# Patient Record
Sex: Female | Born: 1990 | Race: White | Hispanic: No | Smoking: Former smoker
Health system: Southern US, Community
[De-identification: ages and names within clinical notes are randomized; demographics above are authoritative.]

## PROBLEM LIST (undated history)

## (undated) ENCOUNTER — Inpatient Hospital Stay (HOSPITAL_COMMUNITY): Payer: Self-pay

## (undated) DIAGNOSIS — O26891 Other specified pregnancy related conditions, first trimester: Secondary | ICD-10-CM

## (undated) DIAGNOSIS — B379 Candidiasis, unspecified: Secondary | ICD-10-CM

## (undated) DIAGNOSIS — Z3490 Encounter for supervision of normal pregnancy, unspecified, unspecified trimester: Secondary | ICD-10-CM

## (undated) DIAGNOSIS — N898 Other specified noninflammatory disorders of vagina: Secondary | ICD-10-CM

## (undated) DIAGNOSIS — K589 Irritable bowel syndrome without diarrhea: Secondary | ICD-10-CM

## (undated) DIAGNOSIS — N949 Unspecified condition associated with female genital organs and menstrual cycle: Secondary | ICD-10-CM

## (undated) DIAGNOSIS — O98312 Other infections with a predominantly sexual mode of transmission complicating pregnancy, second trimester: Secondary | ICD-10-CM

## (undated) DIAGNOSIS — Z309 Encounter for contraceptive management, unspecified: Secondary | ICD-10-CM

## (undated) DIAGNOSIS — K219 Gastro-esophageal reflux disease without esophagitis: Secondary | ICD-10-CM

## (undated) DIAGNOSIS — Z789 Other specified health status: Secondary | ICD-10-CM

## (undated) DIAGNOSIS — A630 Anogenital (venereal) warts: Secondary | ICD-10-CM

## (undated) HISTORY — DX: Anogenital (venereal) warts: A63.0

## (undated) HISTORY — DX: Encounter for contraceptive management, unspecified: Z30.9

## (undated) HISTORY — DX: Encounter for supervision of normal pregnancy, unspecified, unspecified trimester: Z34.90

## (undated) HISTORY — DX: Other specified pregnancy related conditions, first trimester: O26.891

## (undated) HISTORY — DX: Candidiasis, unspecified: B37.9

## (undated) HISTORY — DX: Other specified noninflammatory disorders of vagina: N89.8

## (undated) HISTORY — DX: Unspecified condition associated with female genital organs and menstrual cycle: N94.9

## (undated) HISTORY — DX: Anogenital (venereal) warts: O98.312

## (undated) HISTORY — DX: Other specified health status: Z78.9

---
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
## 2012-05-05 ENCOUNTER — Other Ambulatory Visit (HOSPITAL_COMMUNITY)
Admission: RE | Admit: 2012-05-05 | Discharge: 2012-05-05 | Disposition: A | Discharge status: Home / Self Care | Payer: Medicaid Other | Source: Ambulatory Visit | Attending: Obstetrics and Gynecology | Admitting: Obstetrics and Gynecology

## 2012-05-05 ENCOUNTER — Other Ambulatory Visit: Payer: Self-pay | Admitting: Obstetrics and Gynecology

## 2012-05-05 DIAGNOSIS — Z01419 Encounter for gynecological examination (general) (routine) without abnormal findings: Secondary | ICD-10-CM | POA: Insufficient documentation

## 2012-05-05 DIAGNOSIS — Z113 Encounter for screening for infections with a predominantly sexual mode of transmission: Secondary | ICD-10-CM | POA: Insufficient documentation

## 2012-07-29 ENCOUNTER — Telehealth: Payer: Self-pay | Admitting: Adult Health

## 2012-07-29 NOTE — Telephone Encounter (Signed)
Taking BC pills as prescribed, has not had a period, is sexually active. Period usually starts on the  5th of each month but has been irregular. Pt informed to continue [facility] and can wait a few more days to see if period starts if not can take home pregnancy test or make appt for pregnancy test. Pt verbalized understanding.

## 2012-08-23 ENCOUNTER — Telehealth: Payer: Self-pay | Admitting: Adult Health

## 2012-08-23 NOTE — Telephone Encounter (Signed)
Last period July 07, 2012, on 3rd pack of new bcp, took two pregnancy test both were neg.  pt states did miss pills, but would take it the next day. Appt made for a pregnancy test.

## 2012-08-26 ENCOUNTER — Encounter: Payer: Self-pay | Admitting: *Deleted

## 2012-08-27 ENCOUNTER — Ambulatory Visit (INDEPENDENT_AMBULATORY_CARE_PROVIDER_SITE_OTHER): Payer: BC Managed Care – PPO | Admitting: Obstetrics & Gynecology

## 2012-08-27 VITALS — BP 98/60 | Ht 65.0 in | Wt 117.2 lb

## 2012-08-27 DIAGNOSIS — Z3202 Encounter for pregnancy test, result negative: Secondary | ICD-10-CM

## 2012-08-27 DIAGNOSIS — Z3200 Encounter for pregnancy test, result unknown: Secondary | ICD-10-CM

## 2012-08-27 LAB — POCT URINE PREGNANCY: Preg Test, Ur: NEGATIVE

## 2012-08-28 LAB — HCG, QUANTITATIVE, PREGNANCY: hCG, Beta Chain, Quant, S: <2 m[IU]/mL

## 2012-08-30 ENCOUNTER — Telehealth: Payer: Self-pay | Admitting: *Deleted

## 2012-08-31 ENCOUNTER — Telehealth: Payer: Self-pay | Admitting: Obstetrics and Gynecology

## 2012-08-31 NOTE — Telephone Encounter (Signed)
Pt aware of QHCG results.

## 2012-09-01 NOTE — Telephone Encounter (Signed)
Pt aware of negative QHCG, transferred call to front staff to make appt with Cyril Mourning, NP

## 2012-09-08 ENCOUNTER — Ambulatory Visit: Payer: BC Managed Care – PPO | Admitting: Adult Health

## 2012-09-16 NOTE — Progress Notes (Signed)
Patient ID: Megan Tran, female   DOB: Jul 21, 1990, 22 y.o.   MRN: [id number] Patient in for evaluation of a possible pregnancy.  UPT POC negative Quantitative is requested

## 2012-10-14 ENCOUNTER — Telehealth: Payer: Self-pay | Admitting: Adult Health

## 2012-10-14 NOTE — Telephone Encounter (Signed)
She missed some pills and is bleeding reassured, keep taking pill if still bleeding next week make appt.

## 2012-11-02 ENCOUNTER — Ambulatory Visit (INDEPENDENT_AMBULATORY_CARE_PROVIDER_SITE_OTHER): Payer: BC Managed Care – PPO | Admitting: Adult Health

## 2012-11-02 ENCOUNTER — Encounter: Payer: Self-pay | Admitting: Adult Health

## 2012-11-02 VITALS — BP 110/66 | Ht 64.0 in | Wt 120.0 lb

## 2012-11-02 DIAGNOSIS — N898 Other specified noninflammatory disorders of vagina: Secondary | ICD-10-CM

## 2012-11-02 DIAGNOSIS — B379 Candidiasis, unspecified: Secondary | ICD-10-CM

## 2012-11-02 HISTORY — DX: Candidiasis, unspecified: B37.9

## 2012-11-02 LAB — POCT URINALYSIS DIPSTICK
Blood, UA: NEGATIVE
Glucose, UA: NEGATIVE
Ketones, UA: NEGATIVE
Leukocytes, UA: NEGATIVE
Nitrite, UA: NEGATIVE

## 2012-11-02 LAB — POCT WET PREP (WET MOUNT): WBC, Wet Prep HPF POC: NEGATIVE

## 2012-11-02 MED ORDER — FLUCONAZOLE 150 MG PO TABS: ORAL_TABLET | ORAL | Status: DC

## 2012-11-02 NOTE — Progress Notes (Signed)
Subjective:     Patient ID: Megan Tran, female   DOB: 02-Sep-1990, 22 y.o.   MRN: [id number]  HPI Megan Tran is a 22 year old in today complaining of discharge and itching and irritation x 1 week,she has had discomfort with sex and said astro glide burned.She had some burning with peeing too. No new sex partners.  Review of Systems Positives in HPI   Reviewed past medical,surgical, social and family history. Reviewed medications and allergies.  Objective:   Physical Exam BP 110/66  Ht 5\' 4"  (1.626 m)  Wt 120 lb (54.432 kg)  BMI 20.59 kg/m2  LMP 10/03/2012   Urine dipstick trace protein, Skin warm and dry.Pelvic: external genitalia is normal in appearance, vagina: white discharge without odor, cervix:smooth, uterus: normal size, shape and contour, non tender, no masses felt, adnexa: no masses or tenderness noted. Wet prep: + for yeast.  Assessment:     Vaginal discharge  Yeast    Plan:     Rx diflucan 150 mg #2 1 now and 1 in 2 days if needed with 1 refill Review handout on yeast, don't sleep in panties Try olive oil with sex Follow up prn

## 2012-11-02 NOTE — Patient Instructions (Addendum)
Monilial Vaginitis Vaginitis in a soreness, swelling and redness (inflammation) of the vagina and vulva. Monilial vaginitis is not a sexually transmitted infection. CAUSES  Yeast vaginitis is caused by yeast (candida) that is normally found in your vagina. With a yeast infection, the candida has overgrown in number to a point that upsets the chemical balance. SYMPTOMS   White, thick vaginal discharge.  Swelling, itching, redness and irritation of the vagina and possibly the lips of the vagina (vulva).  Burning or painful urination.  Painful intercourse. DIAGNOSIS  Things that may contribute to monilial vaginitis are:  Postmenopausal and virginal states.  Pregnancy.  Infections.  Being tired, sick or stressed, especially if you had monilial vaginitis in the past.  Diabetes. Good control will help lower the chance.  Birth control pills.  Tight fitting garments.  Using bubble bath, feminine sprays, douches or deodorant tampons.  Taking certain medications that kill germs (antibiotics).  Sporadic recurrence can occur if you become ill. TREATMENT  Your caregiver will give you medication.  There are several kinds of anti monilial vaginal creams and suppositories specific for monilial vaginitis. For recurrent yeast infections, use a suppository or cream in the vagina 2 times a week, or as directed.  Anti-monilial or steroid cream for the itching or irritation of the vulva may also be used. Get your caregiver's permission.  Painting the vagina with methylene blue solution may help if the monilial cream does not work.  Eating yogurt may help prevent monilial vaginitis. HOME CARE INSTRUCTIONS   Finish all medication as prescribed.  Do not have sex until treatment is completed or after your caregiver tells you it is okay.  Take warm sitz baths.  Do not douche.  Do not use tampons, especially scented ones.  Wear cotton underwear.  Avoid tight pants and panty  hose.  Tell your sexual partner that you have a yeast infection. They should go to their caregiver if they have symptoms such as mild rash or itching.  Your sexual partner should be treated as well if your infection is difficult to eliminate.  Practice safer sex. Use condoms.  Some vaginal medications cause latex condoms to fail. Vaginal medications that harm condoms are:  Cleocin cream.  Butoconazole (Femstat).  Terconazole (Terazol) vaginal suppository.  Miconazole (Monistat) (may be purchased over the counter). SEEK MEDICAL CARE IF:   You have a temperature by mouth above 102 F (38.9 C).  The infection is getting worse after 2 days of treatment.  The infection is not getting better after 3 days of treatment.  You develop blisters in or around your vagina.  You develop vaginal bleeding, and it is not your menstrual period.  You have pain when you urinate.  You develop intestinal problems.  You have pain with sexual intercourse. Document Released: 01/15/2005 Document Revised: 06/30/2011 Document Reviewed: 09/29/2008 ExitCare Patient Information 2014 [employer]. Take diflucan  Follow up prn 

## 2013-02-03 ENCOUNTER — Telehealth: Payer: Self-pay | Admitting: Obstetrics and Gynecology

## 2013-02-03 NOTE — Telephone Encounter (Signed)
Spoke with pt. Started birth control in March. Has had a headache everyday x 1 month. Is on Loestrin. Tylenol and Ibuprofen helps headache. Advised birth control can cause headaches. Advised if she can't tolerate the headaches, call us back and we can get her in to have birth control switched. Pt voiced understanding. JSY

## 2013-07-23 ENCOUNTER — Other Ambulatory Visit: Payer: Self-pay | Admitting: Adult Health

## 2013-08-17 ENCOUNTER — Other Ambulatory Visit: Payer: Self-pay | Admitting: *Deleted

## 2013-09-30 ENCOUNTER — Other Ambulatory Visit: Payer: Self-pay | Admitting: Adult Health

## 2013-11-08 ENCOUNTER — Other Ambulatory Visit: Payer: Self-pay | Admitting: Adult Health

## 2013-11-18 ENCOUNTER — Ambulatory Visit: Payer: BC Managed Care – PPO | Admitting: Obstetrics & Gynecology

## 2013-11-22 ENCOUNTER — Ambulatory Visit (INDEPENDENT_AMBULATORY_CARE_PROVIDER_SITE_OTHER): Payer: BC Managed Care – PPO | Admitting: Obstetrics & Gynecology

## 2013-11-22 ENCOUNTER — Encounter: Payer: Self-pay | Admitting: Obstetrics & Gynecology

## 2013-11-22 VITALS — BP 106/76 | Ht 64.0 in | Wt 128.0 lb

## 2013-11-22 DIAGNOSIS — Z3009 Encounter for other general counseling and advice on contraception: Secondary | ICD-10-CM

## 2013-11-22 DIAGNOSIS — Z30011 Encounter for initial prescription of contraceptive pills: Secondary | ICD-10-CM

## 2013-11-22 MED ORDER — NORETHINDRONE ACET-ETHINYL EST 1-20 MG-MCG PO TABS: ORAL_TABLET | ORAL | Status: DC

## 2013-11-22 NOTE — Progress Notes (Signed)
Patient ID: Megan Tran, female   DOB: 02/05/1991, 23 y.o.   MRN: [id number] On continuous microgestin wants to continue No periods No problems  Blood pressure 106/76, height 5\' 4"  (1.626 m), weight 128 lb (58.06 kg).  No exam  follo wup 1 year

## 2014-03-03 ENCOUNTER — Other Ambulatory Visit (HOSPITAL_COMMUNITY)
Admission: RE | Admit: 2014-03-03 | Discharge: 2014-03-03 | Disposition: A | Discharge status: Home / Self Care | Payer: BC Managed Care – PPO | Source: Ambulatory Visit | Attending: Adult Health | Admitting: Adult Health

## 2014-03-03 ENCOUNTER — Encounter: Payer: Self-pay | Admitting: Adult Health

## 2014-03-03 ENCOUNTER — Ambulatory Visit (INDEPENDENT_AMBULATORY_CARE_PROVIDER_SITE_OTHER): Payer: BC Managed Care – PPO | Admitting: Adult Health

## 2014-03-03 VITALS — BP 108/60 | HR 76 | Ht 63.25 in | Wt 131.0 lb

## 2014-03-03 DIAGNOSIS — Z309 Encounter for contraceptive management, unspecified: Secondary | ICD-10-CM

## 2014-03-03 DIAGNOSIS — Z113 Encounter for screening for infections with a predominantly sexual mode of transmission: Secondary | ICD-10-CM | POA: Insufficient documentation

## 2014-03-03 DIAGNOSIS — Z01419 Encounter for gynecological examination (general) (routine) without abnormal findings: Secondary | ICD-10-CM | POA: Insufficient documentation

## 2014-03-03 DIAGNOSIS — Z308 Encounter for other contraceptive management: Secondary | ICD-10-CM | POA: Insufficient documentation

## 2014-03-03 DIAGNOSIS — Z3041 Encounter for surveillance of contraceptive pills: Secondary | ICD-10-CM

## 2014-03-03 HISTORY — DX: Encounter for contraceptive management, unspecified: Z30.9

## 2014-03-03 NOTE — Progress Notes (Signed)
Patient ID: Megan Tran, female   DOB: 01/18/1991, 23 y.o.   MRN: [id number] History of Present Illness: Megan Tran is a 23 year old white female in for family planning pap and physical.She is on the pill and thinks she may want Skyla IUD.No periods with OCs. She got flu shot.  Current Medications, Allergies, Past Medical History, Past Surgical History, Family History and Social History were reviewed in [facility] electronic medical record.     Review of Systems: Patient denies any headaches, blurred vision, shortness of breath, chest pain, abdominal pain, problems with bowel movements, urination, or intercourse. No joint pain or mood swings, but has some vaginal dryness and decreased libido.   Physical Exam:BP 108/60 mmHg  Pulse 76  Ht 5' 3.25" (1.607 m)  Wt 131 lb (59.421 kg)  BMI 23.01 kg/m2 General:  Well developed, well nourished, no acute distress Skin:  Warm and dry Neck:  Midline trachea, normal thyroid Lungs; Clear to auscultation bilaterally Breast:  No dominant palpable mass, retraction, or nipple discharge Cardiovascular: Regular rate and rhythm Abdomen:  Soft, non tender, no hepatosplenomegaly Pelvic:  External genitalia is normal in appearance.  The vagina is normal in appearance.  The cervix is slightly everted at OS, pap with GC/CHL performed.  Uterus is felt to be normal size, shape, and contour.  No adnexal masses or tenderness noted. Extremities:  No swelling or varicosities noted Psych:  No mood changes,alet and cooperative,seems happy Discussed Skyla and gave handout to review, if likes it call and I will set up appt.Will give cytotec the am of.   Impression: Well woman gyn exam with pap  Contraceptive management    Plan: Physical in 1 year Continue OCs Review handout on Skyla Check HIV,RPR and HSV2

## 2014-03-03 NOTE — Patient Instructions (Signed)
Levonorgestrel intrauterine device (IUD) What is this medicine? LEVONORGESTREL IUD (LEE voe nor jes trel) is a contraceptive (birth control) device. The device is placed inside the uterus by a healthcare professional. It is used to prevent pregnancy and can also be used to treat heavy bleeding that occurs during your period. Depending on the device, it can be used for 3 to 5 years. This medicine may be used for other purposes; ask your health care provider or pharmacist if you have questions. COMMON BRAND NAME(S): LILETTA, Mirena, Skyla What should I tell my health care provider before I take this medicine? They need to know if you have any of these conditions: -abnormal Pap smear -cancer of the breast, uterus, or cervix -diabetes -endometritis -genital or pelvic infection now or in the past -have more than one sexual partner or your partner has more than one partner -heart disease -history of an ectopic or tubal pregnancy -immune system problems -IUD in place -liver disease or tumor -problems with blood clots or take blood-thinners -use intravenous drugs -uterus of unusual shape -vaginal bleeding that has not been explained -an unusual or allergic reaction to levonorgestrel, other hormones, silicone, or polyethylene, medicines, foods, dyes, or preservatives -pregnant or trying to get pregnant -breast-feeding How should I use this medicine? This device is placed inside the uterus by a health care professional. Talk to your pediatrician regarding the use of this medicine in children. Special care may be needed. Overdosage: If you think you have taken too much of this medicine contact a poison control center or emergency room at once. NOTE: This medicine is only for you. Do not share this medicine with others. What if I miss a dose? This does not apply. What may interact with this medicine? Do not take this medicine with any of the following  medications: -amprenavir -bosentan -fosamprenavir This medicine may also interact with the following medications: -aprepitant -barbiturate medicines for inducing sleep or treating seizures -bexarotene -griseofulvin -medicines to treat seizures like carbamazepine, ethotoin, felbamate, oxcarbazepine, phenytoin, topiramate -modafinil -pioglitazone -rifabutin -rifampin -rifapentine -some medicines to treat HIV infection like atazanavir, indinavir, lopinavir, nelfinavir, tipranavir, ritonavir -St. John's wort -warfarin This list may not describe all possible interactions. Give your health care provider a list of all the medicines, herbs, non-prescription drugs, or dietary supplements you use. Also tell them if you smoke, drink alcohol, or use illegal drugs. Some items may interact with your medicine. What should I watch for while using this medicine? Visit your doctor or health care professional for regular check ups. See your doctor if you or your partner has sexual contact with others, becomes HIV positive, or gets a sexual transmitted disease. This product does not protect you against HIV infection (AIDS) or other sexually transmitted diseases. You can check the placement of the IUD yourself by reaching up to the top of your vagina with clean fingers to feel the threads. Do not pull on the threads. It is a good habit to check placement after each menstrual period. Call your doctor right away if you feel more of the IUD than just the threads or if you cannot feel the threads at all. The IUD may come out by itself. You may become pregnant if the device comes out. If you notice that the IUD has come out use a backup birth control method like condoms and call your health care provider. Using tampons will not change the position of the IUD and are okay to use during your period. What side effects may   I notice from receiving this medicine? Side effects that you should report to your doctor or  health care professional as soon as possible: -allergic reactions like skin rash, itching or hives, swelling of the face, lips, or tongue -fever, flu-like symptoms -genital sores -high blood pressure -no menstrual period for 6 weeks during use -pain, swelling, warmth in the leg -pelvic pain or tenderness -severe or sudden headache -signs of pregnancy -stomach cramping -sudden shortness of breath -trouble with balance, talking, or walking -unusual vaginal bleeding, discharge -yellowing of the eyes or skin Side effects that usually do not require medical attention (report to your doctor or health care professional if they continue or are bothersome): -acne -breast pain -change in sex drive or performance -changes in weight -cramping, dizziness, or faintness while the device is being inserted -headache -irregular menstrual bleeding within first 3 to 6 months of use -nausea This list may not describe all possible side effects. Call your doctor for medical advice about side effects. You may report side effects to FDA at 1-800-FDA-1088. Where should I keep my medicine? This does not apply. NOTE: This sheet is a summary. It may not cover all possible information. If you have questions about this medicine, talk to your doctor, pharmacist, or health care provider.  2015, Elsevier/Gold Standard. (2011-05-08 13:54:04) Physical in 1 year

## 2014-03-04 LAB — RPR

## 2014-03-04 LAB — HIV ANTIBODY (ROUTINE TESTING W REFLEX): HIV 1&2 Ab, 4th Generation: NONREACTIVE

## 2014-03-07 LAB — CYTOLOGY - PAP

## 2014-03-08 LAB — HSV 2 ANTIBODY, IGG: HSV 2 Glycoprotein G Ab, IgG: <0.1 IV

## 2014-03-22 ENCOUNTER — Ambulatory Visit: Payer: BC Managed Care – PPO | Admitting: Advanced Practice Midwife

## 2014-03-22 ENCOUNTER — Other Ambulatory Visit: Payer: BC Managed Care – PPO

## 2014-03-23 ENCOUNTER — Other Ambulatory Visit: Payer: BC Managed Care – PPO

## 2014-03-27 ENCOUNTER — Ambulatory Visit: Payer: BC Managed Care – PPO | Admitting: Women's Health

## 2014-03-27 ENCOUNTER — Other Ambulatory Visit: Payer: BC Managed Care – PPO

## 2014-04-03 ENCOUNTER — Other Ambulatory Visit: Payer: BC Managed Care – PPO

## 2014-04-03 ENCOUNTER — Ambulatory Visit: Payer: BC Managed Care – PPO | Admitting: Women's Health

## 2014-09-27 ENCOUNTER — Other Ambulatory Visit: Payer: Self-pay | Admitting: Obstetrics & Gynecology

## 2015-01-30 ENCOUNTER — Ambulatory Visit (INDEPENDENT_AMBULATORY_CARE_PROVIDER_SITE_OTHER): Payer: BLUE CROSS/BLUE SHIELD | Admitting: Obstetrics and Gynecology

## 2015-01-30 ENCOUNTER — Encounter: Payer: Self-pay | Admitting: Obstetrics and Gynecology

## 2015-01-30 VITALS — BP 118/70 | Ht 64.0 in | Wt 131.0 lb

## 2015-01-30 DIAGNOSIS — N9089 Other specified noninflammatory disorders of vulva and perineum: Secondary | ICD-10-CM

## 2015-01-30 MED ORDER — NYSTATIN-TRIAMCINOLONE 100000-0.1 UNIT/GM-% EX OINT: 1.0000 "application " | TOPICAL_OINTMENT | Freq: Two times a day (BID) | CUTANEOUS | Status: DC

## 2015-01-30 NOTE — Progress Notes (Signed)
Patient ID: Megan Tran, female   DOB: [birth year], 24 y.o.   MRN: [id number]   [facility] Visit  Patient name: Megan Tran MRN [id number]  Date of birth: December 01, 1990  CC & HPI:  Megan Tran is a 24 y.o. female presenting today for complaints of irritation 5 days. She sexually active with no suspected risk of STI's. Used his infection is suspected  ROS:  No history of diabetes, any new partners partners etc.  Pertinent History Reviewed:   Reviewed: Significant for normal menses regular. One yeast infection 2014 Medical         Past Medical History  Diagnosis Date  . Medical history non-contributory   . Yeast infection 11/02/2012  . Contraceptive management 03/03/2014                              Surgical Hx:    Past Surgical History  Procedure Laterality Date  . Mouth surgery  Oct. 2012   Medications: Reviewed & Updated - see associated section                      No current outpatient prescriptions on file.   Social History: Reviewed -  reports that she has never smoked. She has never used smokeless tobacco.  Objective Findings:  Vitals: Blood pressure 118/70, height  (1.626 m), weight 131 lb (59.421 kg).  Physical Examination: General appearance - alert, well appearing, and in no distress, oriented to person, place, and time and normal appearing weight Mental status - alert, oriented to person, place, and time, normal mood, behavior, speech, dress, motor activity, and thought processes Abdomen - soft, nontender, nondistended, no masses or organomegaly Pelvic - normal external genitalia, vulva, vagina, cervix, uterus and adnexa, VULVA: normal appearing vulva with no masses, tenderness or lesions, VAGINA: normal appearing vagina with normal color and discharge, no lesions, CERVIX: normal appearing cervix without discharge or lesions, UTERUS: uterus is normal size, shape, consistency and nontender   Assessment & Plan:   A:  1. Normal exam 2.  Symptomatic irritation vulva  P:  1. Reassured  2. Symptomatic tx with Mytrex to vulva hs x 5d.

## 2015-04-30 ENCOUNTER — Ambulatory Visit (INDEPENDENT_AMBULATORY_CARE_PROVIDER_SITE_OTHER): Payer: BLUE CROSS/BLUE SHIELD | Admitting: Obstetrics and Gynecology

## 2015-04-30 ENCOUNTER — Encounter: Payer: Self-pay | Admitting: Obstetrics and Gynecology

## 2015-04-30 VITALS — BP 120/80 | Ht 65.0 in | Wt 132.0 lb

## 2015-04-30 DIAGNOSIS — Z3009 Encounter for other general counseling and advice on contraception: Secondary | ICD-10-CM

## 2015-04-30 DIAGNOSIS — Z3200 Encounter for pregnancy test, result unknown: Secondary | ICD-10-CM

## 2015-04-30 MED ORDER — NORETHINDRONE ACET-ETHINYL EST 1-20 MG-MCG PO TABS: 1.0000 | ORAL_TABLET | Freq: Every day | ORAL | Status: DC

## 2015-04-30 NOTE — Progress Notes (Signed)
Patient ID: Megan Tran [place], female   DOB: 01/26/1991, 25 y.o.   MRN: [id number]   [facility] Visit  Patient name: Megan Tran [place]all MRN [id number]  Date of birth: 01/07/1991  CC & HPI:  Megan Tran Storer is a 25 y.o. female presenting today for restart of ocp's.   ROS:  Pt previously on microgestin 1/20 with good results. Pt desires the same pill.   Pertinent History Reviewed:   Reviewed: Significant for no dvt , no bleeding probs Medical         Past Medical History  Diagnosis Date  . Medical history non-contributory   . Yeast infection 11/02/2012  . Contraceptive management 03/03/2014                              Surgical Hx:    Past Surgical History  Procedure Laterality Date  . Mouth surgery  Oct. 2012   Medications: Reviewed & Updated - see associated section                       Current outpatient prescriptions:  .  norethindrone-ethinyl estradiol (MICROGESTIN) 1-20 MG-MCG tablet, Take 1 tablet by mouth daily., Disp: 1 Package, Rfl: 11 .  nystatin-triamcinolone ointment (MYCOLOG), Apply 1 application topically 2 (two) times daily. To affected area. (Patient not taking: Reported on 04/30/2015), Disp: 30 g, Rfl: prn   Social History: Reviewed -  reports that she has never smoked. She has never used smokeless tobacco.  Objective Findings:  Vitals: Blood pressure 120/80, height 5\' 5"  (1.651 m), weight 132 lb (59.875 kg), last menstrual period 04/28/2015.  Physical Examination: General appearance - alert, well appearing, and in no distress, oriented to person, place, and time and normal appearing weight Mental status - alert, oriented to person, place, and time, normal mood, behavior, speech, dress, motor activity, and thought processes Eyes - pupils equal and reactive, extraocular eye movements intact  I spent 15 minutes with the visit with >than 50% spent in counseling and coordination of care.  Assessment & Plan:   A:  1. contr management  Tran:  1. Restart  microgestin 1/20

## 2015-04-30 NOTE — Progress Notes (Signed)
Patient ID: Megan Tran, female   DOB: 01/22/1991, 25 y.o.   MRN: [id number] Pt here today to discuss getting back on the BCP. UPT today is negative.

## 2015-05-14 ENCOUNTER — Telehealth: Payer: Self-pay | Admitting: *Deleted

## 2015-05-14 NOTE — Telephone Encounter (Signed)
Pt called and stated that she had a discharge and and some irritation and a rash. Pt states that she used monistat and it burned really bad for about 30 minutes but then the irritation and discharge kind of went away. Pt states that she still has a rash. I advised the pt that she would need to keep the appointment that she was given so that the rash can be evaluated. Pt verbalized understanding.

## 2015-05-15 ENCOUNTER — Ambulatory Visit: Payer: BLUE CROSS/BLUE SHIELD | Admitting: Obstetrics & Gynecology

## 2015-05-21 ENCOUNTER — Encounter: Payer: Self-pay | Admitting: Obstetrics and Gynecology

## 2015-05-21 ENCOUNTER — Ambulatory Visit (INDEPENDENT_AMBULATORY_CARE_PROVIDER_SITE_OTHER): Payer: BLUE CROSS/BLUE SHIELD | Admitting: Obstetrics and Gynecology

## 2015-05-21 ENCOUNTER — Ambulatory Visit: Payer: BLUE CROSS/BLUE SHIELD | Admitting: Obstetrics and Gynecology

## 2015-05-21 VITALS — BP 120/72 | Ht 64.0 in | Wt 132.0 lb

## 2015-05-21 DIAGNOSIS — Z01419 Encounter for gynecological examination (general) (routine) without abnormal findings: Secondary | ICD-10-CM

## 2015-05-21 DIAGNOSIS — Z3040 Encounter for surveillance of contraceptives, unspecified: Secondary | ICD-10-CM

## 2015-05-21 MED ORDER — NORETHINDRONE ACET-ETHINYL EST 1-20 MG-MCG PO TABS: 1.0000 | ORAL_TABLET | Freq: Every day | ORAL | Status: DC

## 2015-05-21 NOTE — Progress Notes (Signed)
Patient ID: Megan Tran, female   DOB: 1990-08-12, 25 y.o.   MRN: [id number]  Assessment:  Annual Gyn Exam  STI SCREEN hiv rpr gc chl Plan:  1. pap smear NOTdone, next pap due 02/2017 2. return annually or prn 3    Annual mammogram advised Subjective:  Megan Tran is a 25 y.o. female G0P0000 who presents for annual exam. Patient's last menstrual period was 04/28/2015. The patient has complaints today of new partner , wants sti screen, refil ocp's Recent vulvar irritation responds to otc monistat The following portions of the patient's history were reviewed and updated as appropriate: allergies, current medications, past family history, past medical history, past social history, past surgical history and problem list. Past Medical History  Diagnosis Date  . Medical history non-contributory   . Yeast infection 11/02/2012  . Contraceptive management 03/03/2014    Past Surgical History  Procedure Laterality Date  . Mouth surgery  Oct. 2012     Current outpatient prescriptions:  .  norethindrone-ethinyl estradiol (MICROGESTIN) 1-20 MG-MCG tablet, Take 1 tablet by mouth daily., Disp: 1 Package, Rfl: 11 .  nystatin-triamcinolone ointment (MYCOLOG), Apply 1 application topically 2 (two) times daily. To affected area., Disp: 30 g, Rfl: prn  Review of Systems Constitutional: negative Gastrointestinal: negative Genitourinary: recent pruritis  Objective:  BP 120/72 mmHg  Ht  (1.626 m)  Wt 132 lb (59.875 kg)  BMI 22.65 kg/m2  LMP 04/28/2015   BMI: Body mass index is 22.65 kg/(m^2).  General Appearance: Alert, appropriate appearance for age. No acute distress HEENT: Grossly normal Neck / Thyroid:  Cardiovascular: RRR; normal S1, S2, no murmur Lungs: CTA bilaterally Back: No CVAT Breast Exam: No dimpling, nipple retraction or discharge. No masses or nodes. and No masses or nodes.No dimpling, nipple retraction or discharge. Gastrointestinal: Soft, non-tender, no masses  or organomegaly Pelvic Exam: Vulva and vagina appear normal. Bimanual exam reveals normal uterus and adnexa. gc chl collected Rectovaginal: not indicated and normal rectal, no masses Lymphatic Exam: Non-palpable nodes in neck, clavicular, axillary, or inguinal regions Skin: no rash or abnormalities Neurologic: Normal gait and speech, no tremor  Psychiatric: Alert and oriented, appropriate affect.  Urinalysis:Not done  Christin Bach. MD Pgr [phone] 11:15 AM

## 2015-05-21 NOTE — Progress Notes (Signed)
Patient ID: Megan Tran, female   DOB: [birth year], 25 y.o.   MRN: [id number] Pt here today for annual exam. Pt had normal pap on 03/03/14.

## 2015-05-22 LAB — RPR: RPR Ser Ql: NONREACTIVE

## 2015-05-22 LAB — HIV ANTIBODY (ROUTINE TESTING W REFLEX): HIV SCREEN 4TH GENERATION: NONREACTIVE

## 2015-05-23 LAB — GC/CHLAMYDIA PROBE AMP
Chlamydia trachomatis, NAA: NEGATIVE
NEISSERIA GONORRHOEAE BY PCR: NEGATIVE

## 2015-08-07 ENCOUNTER — Telehealth: Payer: Self-pay | Admitting: Adult Health

## 2015-08-07 NOTE — Telephone Encounter (Signed)
Left message x 1. JSY 

## 2015-08-08 NOTE — Telephone Encounter (Signed)
Left message x 2. JSY 

## 2015-08-09 NOTE — Telephone Encounter (Signed)
Spoke with pt. Pt stopped birth control pills 1 month ago due to it was breaking her face out. She hasn't started her period.  Pt states her nipples are sore. Pregnancy tests have been negative. Pt last had sex Sunday. I advised to do another pregnancy test in 2 weeks if she hasn't started period. Pt may need to be seen if period don't start. Pt voiced understanding. JSY

## 2015-10-19 ENCOUNTER — Ambulatory Visit (INDEPENDENT_AMBULATORY_CARE_PROVIDER_SITE_OTHER): Payer: BLUE CROSS/BLUE SHIELD | Admitting: Adult Health

## 2015-10-19 ENCOUNTER — Encounter: Payer: Self-pay | Admitting: Adult Health

## 2015-10-19 VITALS — BP 100/64 | HR 70 | Ht 64.0 in | Wt 121.0 lb

## 2015-10-19 DIAGNOSIS — Z3490 Encounter for supervision of normal pregnancy, unspecified, unspecified trimester: Secondary | ICD-10-CM

## 2015-10-19 DIAGNOSIS — O3680X0 Pregnancy with inconclusive fetal viability, not applicable or unspecified: Secondary | ICD-10-CM

## 2015-10-19 DIAGNOSIS — Z3201 Encounter for pregnancy test, result positive: Secondary | ICD-10-CM | POA: Diagnosis not present

## 2015-10-19 HISTORY — DX: Encounter for supervision of normal pregnancy, unspecified, unspecified trimester: Z34.90

## 2015-10-19 LAB — POCT URINE PREGNANCY: Preg Test, Ur: POSITIVE — AB

## 2015-10-19 MED ORDER — PRENATAL PLUS 27-1 MG PO TABS: 1.0000 | ORAL_TABLET | Freq: Every day | ORAL | Status: DC

## 2015-10-19 NOTE — Patient Instructions (Signed)
First Trimester of Pregnancy The first trimester of pregnancy is from week 1 until the end of week 12 (months 1 through 3). A week after a sperm fertilizes an egg, the egg will implant on the wall of the uterus. This embryo will begin to develop into a baby. Genes from you and your partner are forming the baby. The female genes determine whether the baby is a boy or a girl. At 6-8 weeks, the eyes and face are formed, and the heartbeat can be seen on ultrasound. At the end of 12 weeks, all the baby's organs are formed.  Now that you are pregnant, you will want to do everything you can to have a healthy baby. Two of the most important things are to get good prenatal care and to follow your health care provider's instructions. Prenatal care is all the medical care you receive before the baby's birth. This care will help prevent, find, and treat any problems during the pregnancy and childbirth. BODY CHANGES Your body goes through many changes during pregnancy. The changes vary from woman to woman.   You may gain or lose a couple of pounds at first.  You may feel sick to your stomach (nauseous) and throw up (vomit). If the vomiting is uncontrollable, call your health care provider.  You may tire easily.  You may develop headaches that can be relieved by medicines approved by your health care provider.  You may urinate more often. Painful urination may mean you have a bladder infection.  You may develop heartburn as a result of your pregnancy.  You may develop constipation because certain hormones are causing the muscles that push waste through your intestines to slow down.  You may develop hemorrhoids or swollen, bulging veins (varicose veins).  Your breasts may begin to grow larger and become tender. Your nipples may stick out more, and the tissue that surrounds them (areola) may become darker.  Your gums may bleed and may be sensitive to brushing and flossing.  Dark spots or blotches (chloasma,  mask of pregnancy) may develop on your face. This will likely fade after the baby is born.  Your menstrual periods will stop.  You may have a loss of appetite.  You may develop cravings for certain kinds of food.  You may have changes in your emotions from day to day, such as being excited to be pregnant or being concerned that something may go wrong with the pregnancy and baby.  You may have more vivid and strange dreams.  You may have changes in your hair. These can include thickening of your hair, rapid growth, and changes in texture. Some women also have hair loss during or after pregnancy, or hair that feels dry or thin. Your hair will most likely return to normal after your baby is born. WHAT TO EXPECT AT YOUR PRENATAL VISITS During a routine prenatal visit:  You will be weighed to make sure you and the baby are growing normally.  Your blood pressure will be taken.  Your abdomen will be measured to track your baby's growth.  The fetal heartbeat will be listened to starting around week 10 or 12 of your pregnancy.  Test results from any previous visits will be discussed. Your health care provider may ask you:  How you are feeling.  If you are feeling the baby move.  If you have had any abnormal symptoms, such as leaking fluid, bleeding, severe headaches, or abdominal cramping.  If you are using any tobacco products,   including cigarettes, chewing tobacco, and electronic cigarettes.  If you have any questions. Other tests that may be performed during your first trimester include:  Blood tests to find your blood type and to check for the presence of any previous infections. They will also be used to check for low iron levels (anemia) and Rh antibodies. Later in the pregnancy, blood tests for diabetes will be done along with other tests if problems develop.  Urine tests to check for infections, diabetes, or protein in the urine.  An ultrasound to confirm the proper growth  and development of the baby.  An amniocentesis to check for possible genetic problems.  Fetal screens for spina bifida and Down syndrome.  You may need other tests to make sure you and the baby are doing well.  HIV (human immunodeficiency virus) testing. Routine prenatal testing includes screening for HIV, unless you choose not to have this test. HOME CARE INSTRUCTIONS  Medicines  Follow your health care provider's instructions regarding medicine use. Specific medicines may be either safe or unsafe to take during pregnancy.  Take your prenatal vitamins as directed.  If you develop constipation, try taking a stool softener if your health care provider approves. Diet  Eat regular, well-balanced meals. Choose a variety of foods, such as meat or vegetable-based protein, fish, milk and low-fat dairy products, vegetables, fruits, and whole grain breads and cereals. Your health care provider will help you determine the amount of weight gain that is right for you.  Avoid raw meat and uncooked cheese. These carry germs that can cause birth defects in the baby.  Eating four or five small meals rather than three large meals a day may help relieve nausea and vomiting. If you start to feel nauseous, eating a few soda crackers can be helpful. Drinking liquids between meals instead of during meals also seems to help nausea and vomiting.  If you develop constipation, eat more high-fiber foods, such as fresh vegetables or fruit and whole grains. Drink enough fluids to keep your urine clear or pale yellow. Activity and Exercise  Exercise only as directed by your health care provider. Exercising will help you:  Control your weight.  Stay in shape.  Be prepared for labor and delivery.  Experiencing pain or cramping in the lower abdomen or low back is a good sign that you should stop exercising. Check with your health care provider before continuing normal exercises.  Try to avoid standing for long  periods of time. Move your legs often if you must stand in one place for a long time.  Avoid heavy lifting.  Wear low-heeled shoes, and practice good posture.  You may continue to have sex unless your health care provider directs you otherwise. Relief of Pain or Discomfort  Wear a good support bra for breast tenderness.   Take warm sitz baths to soothe any pain or discomfort caused by hemorrhoids. Use hemorrhoid cream if your health care provider approves.   Rest with your legs elevated if you have leg cramps or low back pain.  If you develop varicose veins in your legs, wear support hose. Elevate your feet for 15 minutes, 3-4 times a day. Limit salt in your diet. Prenatal Care  Schedule your prenatal visits by the twelfth week of pregnancy. They are usually scheduled monthly at first, then more often in the last 2 months before delivery.  Write down your questions. Take them to your prenatal visits.  Keep all your prenatal visits as directed by your   health care provider. Safety  Wear your seat belt at all times when driving.  Make a list of emergency phone numbers, including numbers for family, friends, the hospital, and police and fire departments. General Tips  Ask your health care provider for a referral to a local prenatal education class. Begin classes no later than at the beginning of month 6 of your pregnancy.  Ask for help if you have counseling or nutritional needs during pregnancy. Your health care provider can offer advice or refer you to specialists for help with various needs.  Do not use hot tubs, steam rooms, or saunas.  Do not douche or use tampons or scented sanitary pads.  Do not cross your legs for long periods of time.  Avoid cat litter boxes and soil used by cats. These carry germs that can cause birth defects in the baby and possibly loss of the fetus by miscarriage or stillbirth.  Avoid all smoking, herbs, alcohol, and medicines not prescribed by  your health care provider. Chemicals in these affect the formation and growth of the baby.  Do not use any tobacco products, including cigarettes, chewing tobacco, and electronic cigarettes. If you need help quitting, ask your health care provider. You may receive counseling support and other resources to help you quit.  Schedule a dentist appointment. At home, brush your teeth with a soft toothbrush and be gentle when you floss. SEEK MEDICAL CARE IF:   You have dizziness.  You have mild pelvic cramps, pelvic pressure, or nagging pain in the abdominal area.  You have persistent nausea, vomiting, or diarrhea.  You have a bad smelling vaginal discharge.  You have pain with urination.  You notice increased swelling in your face, hands, legs, or ankles. SEEK IMMEDIATE MEDICAL CARE IF:   You have a fever.  You are leaking fluid from your vagina.  You have spotting or bleeding from your vagina.  You have severe abdominal cramping or pain.  You have rapid weight gain or loss.  You vomit blood or material that looks like coffee grounds.  You are exposed to German measles and have never had them.  You are exposed to fifth disease or chickenpox.  You develop a severe headache.  You have shortness of breath.  You have any kind of trauma, such as from a fall or a car accident.   This information is not intended to replace advice given to you by your health care provider. Make sure you discuss any questions you have with your health care provider.   Document Released: 04/01/2001 Document Revised: 04/28/2014 Document Reviewed: 02/15/2013 Elsevier Interactive Patient Education 2016 [employer] Return in 2 weeks for US  

## 2015-10-19 NOTE — Progress Notes (Signed)
Subjective:     Patient ID: Aldona LentoBrittany P Panning, female   DOB: 06/22/1990, 25 y.o.   MRN: [id number]  HPI Lowanda FosterBrittany is a 25 year old white female, engaged in for UPT.Has sinus infection and is on amoxicillin, no complaints.She works 3rd shift at [employer].  Review of Systems Patient denies any headaches, hearing loss, fatigue, blurred vision, shortness of breath, chest pain, abdominal pain, problems with bowel movements, urination, or intercourse. No joint pain or mood swings.No bleeding or nausea or vomiting. Reviewed past medical,surgical, social and family history. Reviewed medications and allergies.     Objective:   Physical Exam BP 100/64 mmHg  Pulse 70  Ht 5\' 4"  (1.626 m)  Wt 121 lb (54.885 kg)  BMI 20.76 kg/m2  LMP 09/18/2015 (Approximate) UPT + about 4+ 3 weeks by LMP with EDD 06/24/16, medicaid form given, Skin warm and dry. Neck: mid line trachea, normal thyroid, good ROM, no lymphadenopathy noted. Lungs: clear to ausculation bilaterally. Cardiovascular: regular rate and rhythm.Abdomen soft and non tender.    Assessment:     UPT + Pregnant     Plan:     Rx prenatal plus #30 take 1 daily with 12 refills Return in 2 weeks for dating US Review handout on first trimester

## 2015-10-28 ENCOUNTER — Encounter (HOSPITAL_COMMUNITY): Payer: Self-pay | Admitting: Emergency Medicine

## 2015-10-28 ENCOUNTER — Emergency Department (HOSPITAL_COMMUNITY)
Admission: EM | Admit: 2015-10-28 | Discharge: 2015-10-28 | Disposition: A | Discharge status: Home / Self Care | Payer: BLUE CROSS/BLUE SHIELD | Attending: Emergency Medicine | Admitting: Emergency Medicine

## 2015-10-28 DIAGNOSIS — T3695XA Adverse effect of unspecified systemic antibiotic, initial encounter: Secondary | ICD-10-CM | POA: Diagnosis not present

## 2015-10-28 DIAGNOSIS — Z87891 Personal history of nicotine dependence: Secondary | ICD-10-CM | POA: Diagnosis not present

## 2015-10-28 DIAGNOSIS — Z3A01 Less than 8 weeks gestation of pregnancy: Secondary | ICD-10-CM | POA: Diagnosis not present

## 2015-10-28 DIAGNOSIS — T7840XA Allergy, unspecified, initial encounter: Secondary | ICD-10-CM

## 2015-10-28 DIAGNOSIS — Z79899 Other long term (current) drug therapy: Secondary | ICD-10-CM | POA: Diagnosis not present

## 2015-10-28 DIAGNOSIS — O26891 Other specified pregnancy related conditions, first trimester: Secondary | ICD-10-CM | POA: Diagnosis present

## 2015-10-28 MED ORDER — SODIUM CHLORIDE 0.9 % IV BOLUS (SEPSIS)
1000.0000 mL | Freq: Once | INTRAVENOUS | Status: AC
  Administered 2015-10-28: 1000 mL via INTRAVENOUS

## 2015-10-28 MED ORDER — METHYLPREDNISOLONE SODIUM SUCC 125 MG IJ SOLR
60.0000 mg | Freq: Once | INTRAMUSCULAR | Status: AC
  Administered 2015-10-28: 60 mg via INTRAVENOUS
  Filled 2015-10-28: qty 2

## 2015-10-28 MED ORDER — DIPHENHYDRAMINE HCL 50 MG/ML IJ SOLN
12.5000 mg | Freq: Once | INTRAMUSCULAR | Status: AC
  Administered 2015-10-28: 12.5 mg via INTRAVENOUS
  Filled 2015-10-28: qty 1

## 2015-10-28 MED ORDER — FAMOTIDINE IN NACL 20-0.9 MG/50ML-% IV SOLN
10.0000 mg | Freq: Once | INTRAVENOUS | Status: AC
  Administered 2015-10-28: 10 mg via INTRAVENOUS
  Filled 2015-10-28: qty 50

## 2015-10-28 NOTE — Discharge Instructions (Signed)
Stop antibiotic. Can take Benadryl every 4-6 hours. Follow-up your primary care doctor.

## 2015-10-28 NOTE — ED Provider Notes (Signed)
History  By signing my name below, I, Megan Tran, attest that this documentation has been prepared under the direction and in the presence of Donnetta HutchingBrian Laquita Harlan, MD. Electronically Signed: Earmon PhoenixJennifer Tran, ED Scribe. 10/28/2015. 12:32 PM  Chief Complaint  Patient presents with  . Allergic Reaction   The history is provided by the patient and medical records. No language interpreter was used.    HPI Comments:  Aldona LentoBrittany P Evilsizer is a 25 y.o. female at [redacted] weeks gestation who presents to the Emergency Department complaining of an allergic reaction to Cefdinir that began about one hour ago. She has been taking the medication twice daily for the past two days. She reports a rash on her face and some tightness in her throat. She has not taken anything to treat her symptoms. She denies modifying factors. She denies fever, chills, nausea, vomiting, difficulty swallowing. OB is Dr. Emelda FearFerguson.   Past Medical History  Diagnosis Date  . Medical history non-contributory   . Yeast infection 11/02/2012  . Contraceptive management 03/03/2014  . Pregnant 10/19/2015   Past Surgical History  Procedure Laterality Date  . Mouth surgery  Oct. 2012   Family History  Problem Relation Age of Onset  . Hypertension Maternal Grandmother   . Hypertension Maternal Grandfather   . Heart disease Maternal Grandfather    Social History  Substance Use Topics  . Smoking status: Former Games developermoker  . Smokeless tobacco: Never Used  . Alcohol Use: 0.0 oz/week    0 Standard drinks or equivalent per week     Comment: occassional   OB History    Gravida Para Term Preterm AB TAB SAB Ectopic Multiple Living   1 0 0 0 0 0 0 0 0 0      Review of Systems A complete 10 system review of systems was obtained and all systems are negative except as noted in the HPI and PMH.   Allergies  Cefdinir  Home Medications   Prior to Admission medications   Medication Sig Start Date End Date Taking? Authorizing Provider  cefdinir  (OMNICEF) 300 MG capsule Take 1 capsule by mouth 2 (two) times daily. For 10 days, starting 10/26/15 10/26/15  Yes Historical Provider, MD  prenatal vitamin w/FE, FA (PRENATAL 1 + 1) 27-1 MG TABS tablet Take 1 tablet by mouth daily at 12 noon. 10/19/15  Yes Adline PotterJennifer A Griffin, NP   Triage Vitals: BP 107/67 mmHg  Pulse 111  Temp(Src) 97.7 F (36.5 C) (Oral)  Resp 16  Ht 5\' 4"  (1.626 m)  Wt 120 lb (54.432 kg)  BMI 20.59 kg/m2  SpO2 97%  LMP 09/18/2015 (Approximate) Physical Exam  Constitutional: She is oriented to person, place, and time. She appears well-developed and well-nourished.  HENT:  Head: Normocephalic and atraumatic.  Eyes: Conjunctivae are normal.  Neck: Neck supple.  Cardiovascular: Normal rate and regular rhythm.   Pulmonary/Chest: Effort normal and breath sounds normal. No respiratory distress.  Abdominal: Soft. Bowel sounds are normal.  Musculoskeletal: Normal range of motion.  Neurological: She is alert and oriented to person, place, and time.  Skin: Skin is warm and dry. Rash noted.  Diffuse, blotchy, erythematous macular rash to face, chest.  Psychiatric: She has a normal mood and affect. Her behavior is normal.  Nursing note and vitals reviewed.   ED Course  Procedures (including critical care time) DIAGNOSTIC STUDIES: Oxygen Saturation is 97% on RA, normal by my interpretation.   COORDINATION OF CARE: 11:12 AM- Will discontinue antibiotics. Will order IV Pepcid,  Benadryl and SoluMedrol. Pt verbalizes understanding and agrees to plan.  Medications  sodium chloride 0.9 % bolus 1,000 mL (1,000 mLs Intravenous New Bag/Given 10/28/15 1125)  methylPREDNISolone sodium succinate (SOLU-MEDROL) 125 mg/2 mL injection 60 mg (60 mg Intravenous Given 10/28/15 1126)  famotidine (PEPCID) IVPB 20 mg premix (0 mg Intravenous Stopped 10/28/15 1225)  diphenhydrAMINE (BENADRYL) injection 12.5 mg (12.5 mg Intravenous Given 10/28/15 1126)    Labs Review Labs Reviewed - No data to  display  Imaging Review No results found. I have personally reviewed and evaluated these images and lab results as part of my medical decision-making.   EKG Interpretation None      MDM   Final diagnoses:  Allergic reaction, initial encounter    Patient feels better after IV steroids, IV Pepcid, IV Benadryl [all half doses].   Discussed with mother and patient. Will stop antibiotic.  I personally performed the services described in this documentation, which was scribed in my presence. The recorded information has been reviewed and is accurate.      Donnetta Hutching, MD 10/28/15 1304

## 2015-10-28 NOTE — ED Notes (Signed)
Patient having allergic reaction to Cefdinir. Patient has hives to torso, chest and face that is continue to spread. Patient reports slight difficulty breathing and swallowing. Patient able to handle oral secretions at this time. O2 sat 97% in triage. Patient [redacted] weeks pregnant. Patient given antibiotics for sinus infection and bronchitis.

## 2015-10-28 NOTE — ED Notes (Signed)
Pt noted that her throat was feeling better. No swelling noted. Decreased in facial redness.

## 2015-10-29 ENCOUNTER — Ambulatory Visit (INDEPENDENT_AMBULATORY_CARE_PROVIDER_SITE_OTHER): Payer: BLUE CROSS/BLUE SHIELD | Admitting: Adult Health

## 2015-10-29 ENCOUNTER — Encounter: Payer: Self-pay | Admitting: Adult Health

## 2015-10-29 VITALS — BP 100/60 | HR 96 | Ht 64.0 in | Wt 118.5 lb

## 2015-10-29 DIAGNOSIS — Z3A01 Less than 8 weeks gestation of pregnancy: Secondary | ICD-10-CM

## 2015-10-29 DIAGNOSIS — T50905A Adverse effect of unspecified drugs, medicaments and biological substances, initial encounter: Secondary | ICD-10-CM

## 2015-10-29 DIAGNOSIS — T887XXA Unspecified adverse effect of drug or medicament, initial encounter: Secondary | ICD-10-CM

## 2015-10-29 DIAGNOSIS — Z3490 Encounter for supervision of normal pregnancy, unspecified, unspecified trimester: Secondary | ICD-10-CM

## 2015-10-29 NOTE — Progress Notes (Signed)
Subjective:     Patient ID: Megan Tran, female   DOB: 09/11/1990, 25 y.o.   MRN: [id number]  HPI Megan Tran is a 25 year old white female almost [redacted] weeks pregnant, in for follow up of ER visit yesterday for drug reaction, has rash and throat felt tight, was [facility] urgent [facility] and [facility], has sinus pressure and cough.  Review of Systems Patient denies any headaches, hearing loss, fatigue, blurred vision, shortness of breath, chest pain, abdominal pain, problems with bowel movements, urination, or intercourse. No joint pain or mood swings.See HPI for positives. Reviewed past medical,surgical, social and family history. Reviewed medications and allergies.     Objective:   Physical Exam BP 100/60 mmHg  Pulse 96  Ht 5\' 4"  (1.626 m)  Wt 118 lb 8 oz (53.751 kg)  BMI 20.33 kg/m2  LMP 09/18/2015 (Approximate) Skin warm and dry, has fine red raised rash over chest, trunk and face. Lungs: clear to ausculation bilaterally. No sinus tenderness palpated today. Cardiovascular: regular rate and rhythm.US shows gestational sac and ?fetal pole.    Assessment:     Drug reaction    Pregnant Plan:     Take benadryl or zyrtec Push fluids Stay cool  Keep appt next week for [place]

## 2015-10-29 NOTE — Patient Instructions (Signed)
Take benadryl or zyrtec Push fluids Stay cool Keep appt as scheduled

## 2015-11-05 ENCOUNTER — Ambulatory Visit (INDEPENDENT_AMBULATORY_CARE_PROVIDER_SITE_OTHER): Payer: BLUE CROSS/BLUE SHIELD

## 2015-11-05 DIAGNOSIS — O3680X0 Pregnancy with inconclusive fetal viability, not applicable or unspecified: Secondary | ICD-10-CM

## 2015-11-05 DIAGNOSIS — Z3A08 8 weeks gestation of pregnancy: Secondary | ICD-10-CM

## 2015-11-05 NOTE — Progress Notes (Signed)
US 8 wks,single IUP, pos fht 167 bpm,normal ov's bilat,crl 16.2 mm

## 2015-11-12 ENCOUNTER — Telehealth: Payer: Self-pay | Admitting: Advanced Practice Midwife

## 2015-11-12 NOTE — Telephone Encounter (Signed)
Pt called stating that she would like for a doctor to call in a medication for her. Pt states that she has a possible UTI. Please contact pt

## 2015-11-12 NOTE — Telephone Encounter (Signed)
Pt states she thinks she has some kind of infection, either UTI or BV.  Pt informed she will need OV to evaluate and call transferred to front staff for appointment to be made.

## 2015-11-15 ENCOUNTER — Encounter: Payer: BLUE CROSS/BLUE SHIELD | Admitting: Advanced Practice Midwife

## 2015-11-16 ENCOUNTER — Encounter: Payer: Self-pay | Admitting: Adult Health

## 2015-11-16 ENCOUNTER — Ambulatory Visit (INDEPENDENT_AMBULATORY_CARE_PROVIDER_SITE_OTHER): Payer: BLUE CROSS/BLUE SHIELD | Admitting: Adult Health

## 2015-11-16 VITALS — BP 100/60 | HR 94 | Ht 64.0 in | Wt 117.0 lb

## 2015-11-16 DIAGNOSIS — N898 Other specified noninflammatory disorders of vagina: Secondary | ICD-10-CM | POA: Diagnosis not present

## 2015-11-16 DIAGNOSIS — O26891 Other specified pregnancy related conditions, first trimester: Secondary | ICD-10-CM

## 2015-11-16 DIAGNOSIS — Z331 Pregnant state, incidental: Secondary | ICD-10-CM

## 2015-11-16 DIAGNOSIS — B379 Candidiasis, unspecified: Secondary | ICD-10-CM | POA: Diagnosis not present

## 2015-11-16 DIAGNOSIS — N949 Unspecified condition associated with female genital organs and menstrual cycle: Secondary | ICD-10-CM | POA: Diagnosis not present

## 2015-11-16 DIAGNOSIS — N9489 Other specified conditions associated with female genital organs and menstrual cycle: Secondary | ICD-10-CM

## 2015-11-16 DIAGNOSIS — Z3490 Encounter for supervision of normal pregnancy, unspecified, unspecified trimester: Secondary | ICD-10-CM

## 2015-11-16 HISTORY — DX: Other specified noninflammatory disorders of vagina: N89.8

## 2015-11-16 HISTORY — DX: Unspecified condition associated with female genital organs and menstrual cycle: N94.9

## 2015-11-16 HISTORY — DX: Other specified noninflammatory disorders of vagina: O26.891

## 2015-11-16 HISTORY — DX: Other specified conditions associated with female genital organs and menstrual cycle: N94.89

## 2015-11-16 LAB — POCT WET PREP (WET MOUNT)
Clue Cells Wet Prep Whiff POC: NEGATIVE
WBC WET PREP: POSITIVE

## 2015-11-16 MED ORDER — MICONAZOLE NITRATE 2 % VA CREA: 1.0000 | TOPICAL_CREAM | Freq: Every day | VAGINAL | Status: DC

## 2015-11-16 MED ORDER — MICONAZOLE NITRATE 2 % VA CREA: 1.0000 | TOPICAL_CREAM | Freq: Every day | VAGINAL | 0 refills | Status: DC

## 2015-11-16 NOTE — Patient Instructions (Signed)

## 2015-11-16 NOTE — Progress Notes (Signed)
Subjective:     Patient ID: Megan Tran, female   DOB: 12/11/1990, 25 y.o.   MRN: [id number]  HPI Megan Tran is a 25 year old white female, pregnant 23+4 weeks in complaining of vaginal discharge and burning and itching for about a week and stomach hurts at times above navel,she has some heartburn and thinks it is gas.  Review of Systems Patient denies any headaches, hearing loss, fatigue, blurred vision, shortness of breath, chest pain,  problems with bowel movements, urination, or intercourse. No joint pain or mood swings.See HPI for positives. Reviewed past medical,surgical, social and family history. Reviewed medications and allergies.     Objective:   Physical Exam BP 100/60 (BP Location: Left Arm, Patient Position: Sitting, Cuff Size: Normal)   Pulse 94   Ht 5\' 4"  (1.626 m)   Wt 117 lb (53.1 kg)   LMP 09/18/2015 (Approximate)   BMI 20.08 kg/m  urine trace leuks and trace blood, Skin warm and dry.Pelvic: external genitalia is red in creases,no lesions, vagina: white discharge without odor,urethra has no lesions or masses noted, cervix:tiny, uterus: 9-10 week size, non tender, no masses felt, adnexa: no masses or tenderness noted. Bladder is non tender and no masses felt. Wet prep: + for yeast and +WBCs.FHR 168 via doppler.     Assessment:     Yeast infection Vaginal discharge Vaginal burning Pregnant     Plan:     Rx monistat use once daily inside and out Ok to try tums or prilosec Increase water  Follow up as scheduled

## 2015-11-16 NOTE — Addendum Note (Signed)
Addended by: Cyril Mourning A on: 11/16/2015 02:33 PM   Modules accepted: Orders

## 2015-11-21 ENCOUNTER — Encounter: Payer: BLUE CROSS/BLUE SHIELD | Admitting: Advanced Practice Midwife

## 2015-12-04 ENCOUNTER — Encounter: Payer: Self-pay | Admitting: Advanced Practice Midwife

## 2015-12-04 ENCOUNTER — Ambulatory Visit (INDEPENDENT_AMBULATORY_CARE_PROVIDER_SITE_OTHER): Payer: BLUE CROSS/BLUE SHIELD | Admitting: Advanced Practice Midwife

## 2015-12-04 VITALS — BP 94/60 | HR 96 | Wt 117.0 lb

## 2015-12-04 DIAGNOSIS — Z369 Encounter for antenatal screening, unspecified: Secondary | ICD-10-CM

## 2015-12-04 DIAGNOSIS — Z3491 Encounter for supervision of normal pregnancy, unspecified, first trimester: Secondary | ICD-10-CM

## 2015-12-04 DIAGNOSIS — Z1389 Encounter for screening for other disorder: Secondary | ICD-10-CM

## 2015-12-04 DIAGNOSIS — Z331 Pregnant state, incidental: Secondary | ICD-10-CM

## 2015-12-04 LAB — POCT URINALYSIS DIPSTICK
Glucose, UA: NEGATIVE
KETONES UA: NEGATIVE
Leukocytes, UA: NEGATIVE
Nitrite, UA: NEGATIVE
Protein, UA: NEGATIVE
RBC UA: NEGATIVE

## 2015-12-04 NOTE — Progress Notes (Signed)
  Subjective:    Megan LentoBrittany P Tran is a G1P0000 1053w1d being seen today for her first obstetrical visit.  Her obstetrical history is significant for first pregnancy.  Feels "great".  Pregnancy history fully reviewed.  Patient reports no complaints.  Vitals:   12/04/15 1129  BP: 94/60  Pulse: 96  Weight: 117 lb (53.1 kg)    HISTORY: OB History  Gravida Para Term Preterm AB Living  1 0 0 0 0 0  SAB TAB Ectopic Multiple Live Births  0 0 0 0      # Outcome Date GA Lbr Len/2nd Weight Sex Delivery Anes PTL Lv  1 Current              Past Medical History:  Diagnosis Date  . Contraceptive management 03/03/2014  . Medical history non-contributory   . Pregnant 10/19/2015  . Vaginal burning 11/16/2015  . Vaginal discharge during pregnancy in first trimester 11/16/2015  . Yeast infection 11/02/2012   Past Surgical History:  Procedure Laterality Date  . MOUTH SURGERY  Oct. 2012   Family History  Problem Relation Age of Onset  . Hypertension Maternal Grandmother   . Hypertension Maternal Grandfather   . Heart disease Maternal Grandfather      Exam                                      System:     Skin: normal coloration and turgor, no rashes    Neurologic: oriented, normal, normal mood   Extremities: normal strength, tone, and muscle mass   HEENT PERRLA   Mouth/Teeth mucous membranes moist, normal dentition   Neck supple and no masses   Cardiovascular: regular rate and rhythm   Respiratory:  appears well, vitals normal, no respiratory distress, acyanotic   Abdomen: soft, non-tender;  FHR: 160          Assessment:    Pregnancy: G1P0000 Patient Active Problem List   Diagnosis Date Noted  . Vaginal discharge during pregnancy in first trimester 11/16/2015  . Vaginal burning 11/16/2015  . Supervision of normal pregnancy 10/19/2015  . Vulvar irritation 01/30/2015  . Contraceptive management 03/03/2014  . Yeast infection 11/02/2012        Plan:      Initial labs drawn. Continue prenatal vitamins  Problem list reviewed and updated  Reviewed n/v relief measures and warning s/s to report  Reviewed recommended weight gain based on pre-gravid BMI  Encouraged well-balanced diet Genetic Screening discussed Integrated Screen: requested.  Ultrasound discussed; fetal survey: requested.  Return 8/21 FOR NT/IT ONLY; 4 weeks LROB.  CRESENZO-DISHMAN,Jaciel Diem 12/04/2015

## 2015-12-04 NOTE — Assessment & Plan Note (Addendum)
  [facility]  Init[facility] Care at  8 weeks  [facility]  Dating By 8 week US  Pap 03/13/14 normal  GC/CT Initial:                36+wks:  Genetic Screen NT/IT:   CF screen   Anatomic US   Flu vaccine   Tdap Recommended ~ 28wks  Glucose Screen  2 hr  GBS   Feed Preference   Contraception   Circumcision   Childbirth Classes   Pediatrician

## 2015-12-04 NOTE — Progress Notes (Signed)
Pt given CCNC form and lab consent to read over and sign.

## 2015-12-04 NOTE — Patient Instructions (Signed)
 First Trimester of Pregnancy The first trimester of pregnancy is from week 1 until the end of week 12 (months 1 through 3). A week after a sperm fertilizes an egg, the egg will implant on the wall of the uterus. This embryo will begin to develop into a baby. Genes from you and your partner are forming the baby. The female genes determine whether the baby is a boy or a girl. At 6-8 weeks, the eyes and face are formed, and the heartbeat can be seen on ultrasound. At the end of 12 weeks, all the baby's organs are formed.  Now that you are pregnant, you will want to do everything you can to have a healthy baby. Two of the most important things are to get good prenatal care and to follow your health care provider's instructions. Prenatal care is all the medical care you receive before the baby's birth. This care will help prevent, find, and treat any problems during the pregnancy and childbirth. BODY CHANGES Your body goes through many changes during pregnancy. The changes vary from woman to woman.   You may gain or lose a couple of pounds at first.  You may feel sick to your stomach (nauseous) and throw up (vomit). If the vomiting is uncontrollable, call your health care provider.  You may tire easily.  You may develop headaches that can be relieved by medicines approved by your health care provider.  You may urinate more often. Painful urination may mean you have a bladder infection.  You may develop heartburn as a result of your pregnancy.  You may develop constipation because certain hormones are causing the muscles that push waste through your intestines to slow down.  You may develop hemorrhoids or swollen, bulging veins (varicose veins).  Your breasts may begin to grow larger and become tender. Your nipples may stick out more, and the tissue that surrounds them (areola) may become darker.  Your gums may bleed and may be sensitive to brushing and flossing.  Dark spots or blotches  (chloasma, mask of pregnancy) may develop on your face. This will likely fade after the baby is born.  Your menstrual periods will stop.  You may have a loss of appetite.  You may develop cravings for certain kinds of food.  You may have changes in your emotions from day to day, such as being excited to be pregnant or being concerned that something may go wrong with the pregnancy and baby.  You may have more vivid and strange dreams.  You may have changes in your hair. These can include thickening of your hair, rapid growth, and changes in texture. Some women also have hair loss during or after pregnancy, or hair that feels dry or thin. Your hair will most likely return to normal after your baby is born. WHAT TO EXPECT AT YOUR PRENATAL VISITS During a routine prenatal visit:  You will be weighed to make sure you and the baby are growing normally.  Your blood pressure will be taken.  Your abdomen will be measured to track your baby's growth.  The fetal heartbeat will be listened to starting around week 10 or 12 of your pregnancy.  Test results from any previous visits will be discussed. Your health care provider may ask you:  How you are feeling.  If you are feeling the baby move.  If you have had any abnormal symptoms, such as leaking fluid, bleeding, severe headaches, or abdominal cramping.  If you have any questions. Other   tests that may be performed during your first trimester include:  Blood tests to find your blood type and to check for the presence of any previous infections. They will also be used to check for low iron levels (anemia) and Rh antibodies. Later in the pregnancy, blood tests for diabetes will be done along with other tests if problems develop.  Urine tests to check for infections, diabetes, or protein in the urine.  An ultrasound to confirm the proper growth and development of the baby.  An amniocentesis to check for possible genetic problems.  Fetal  screens for spina bifida and Down syndrome.  You may need other tests to make sure you and the baby are doing well. HOME CARE INSTRUCTIONS  Medicines  Follow your health care provider's instructions regarding medicine use. Specific medicines may be either safe or unsafe to take during pregnancy.  Take your prenatal vitamins as directed.  If you develop constipation, try taking a stool softener if your health care provider approves. Diet  Eat regular, well-balanced meals. Choose a variety of foods, such as meat or vegetable-based protein, fish, milk and low-fat dairy products, vegetables, fruits, and whole grain breads and cereals. Your health care provider will help you determine the amount of weight gain that is right for you.  Avoid raw meat and uncooked cheese. These carry germs that can cause birth defects in the baby.  Eating four or five small meals rather than three large meals a day may help relieve nausea and vomiting. If you start to feel nauseous, eating a few soda crackers can be helpful. Drinking liquids between meals instead of during meals also seems to help nausea and vomiting.  If you develop constipation, eat more high-fiber foods, such as fresh vegetables or fruit and whole grains. Drink enough fluids to keep your urine clear or pale yellow. Activity and Exercise  Exercise only as directed by your health care provider. Exercising will help you:  Control your weight.  Stay in shape.  Be prepared for labor and delivery.  Experiencing pain or cramping in the lower abdomen or low back is a good sign that you should stop exercising. Check with your health care provider before continuing normal exercises.  Try to avoid standing for long periods of time. Move your legs often if you must stand in one place for a long time.  Avoid heavy lifting.  Wear low-heeled shoes, and practice good posture.  You may continue to have sex unless your health care provider directs you  otherwise. Relief of Pain or Discomfort  Wear a good support bra for breast tenderness.   Take warm sitz baths to soothe any pain or discomfort caused by hemorrhoids. Use hemorrhoid cream if your health care provider approves.   Rest with your legs elevated if you have leg cramps or low back pain.  If you develop varicose veins in your legs, wear support hose. Elevate your feet for 15 minutes, 3-4 times a day. Limit salt in your diet. Prenatal Care  Schedule your prenatal visits by the twelfth week of pregnancy. They are usually scheduled monthly at first, then more often in the last 2 months before delivery.  Write down your questions. Take them to your prenatal visits.  Keep all your prenatal visits as directed by your health care provider. Safety  Wear your seat belt at all times when driving.  Make a list of emergency phone numbers, including numbers for family, friends, the hospital, and police and fire departments. General   Tips  Ask your health care provider for a referral to a local prenatal education class. Begin classes no later than at the beginning of month 6 of your pregnancy.  Ask for help if you have counseling or nutritional needs during pregnancy. Your health care provider can offer advice or refer you to specialists for help with various needs.  Do not use hot tubs, steam rooms, or saunas.  Do not douche or use tampons or scented sanitary pads.  Do not cross your legs for long periods of time.  Avoid cat litter boxes and soil used by cats. These carry germs that can cause birth defects in the baby and possibly loss of the fetus by miscarriage or stillbirth.  Avoid all smoking, herbs, alcohol, and medicines not prescribed by your health care provider. Chemicals in these affect the formation and growth of the baby.  Schedule a dentist appointment. At home, brush your teeth with a soft toothbrush and be gentle when you floss. SEEK MEDICAL CARE IF:   You have  dizziness.  You have mild pelvic cramps, pelvic pressure, or nagging pain in the abdominal area.  You have persistent nausea, vomiting, or diarrhea.  You have a bad smelling vaginal discharge.  You have pain with urination.  You notice increased swelling in your face, hands, legs, or ankles. SEEK IMMEDIATE MEDICAL CARE IF:   You have a fever.  You are leaking fluid from your vagina.  You have spotting or bleeding from your vagina.  You have severe abdominal cramping or pain.  You have rapid weight gain or loss.  You vomit blood or material that looks like coffee grounds.  You are exposed to German measles and have never had them.  You are exposed to fifth disease or chickenpox.  You develop a severe headache.  You have shortness of breath.  You have any kind of trauma, such as from a fall or a car accident. Document Released: 04/01/2001 Document Revised: 08/22/2013 Document Reviewed: 02/15/2013 [employer] Patient Information 2015 [employer]. This information is not intended to replace advice given to you by your health care provider. Make sure you discuss any questions you have with your health care provider.   Nausea & Vomiting  Have saltine crackers or pretzels by your bed and eat a few bites before you raise your head out of bed in the morning  Eat small frequent meals throughout the day instead of large meals  Drink plenty of fluids throughout the day to stay hydrated, just don't drink a lot of fluids with your meals.  This can make your stomach fill up faster making you feel sick  Do not brush your teeth right after you eat  Products with real ginger are good for nausea, like ginger ale and ginger hard candy Make sure it says made with real ginger!  Sucking on sour candy like lemon heads is also good for nausea  If your prenatal vitamins make you nauseated, take them at night so you will sleep through the nausea  Sea Bands  If you feel like you need  medicine for the nausea & vomiting please let us know  If you are unable to keep any fluids or food down please let us know   Constipation  Drink plenty of fluid, preferably water, throughout the day  Eat foods high in fiber such as fruits, vegetables, and grains  Exercise, such as walking, is a good way to keep your bowels regular  Drink warm fluids, especially warm   prune juice, or decaf coffee  Eat a 1/2 cup of real oatmeal (not instant), 1/2 cup applesauce, and 1/2-1 cup warm prune juice every day  If needed, you may take Colace (docusate sodium) stool softener once or twice a day to help keep the stool soft. If you are pregnant, wait until you are out of your first trimester (12-14 weeks of pregnancy)  If you still are having problems with constipation, you may take Miralax once daily as needed to help keep your bowels regular.  If you are pregnant, wait until you are out of your first trimester (12-14 weeks of pregnancy)  Safe Medications in Pregnancy   Acne: Benzoyl Peroxide Salicylic Acid  Backache/Headache: Tylenol: 2 regular strength every 4 hours OR              2 Extra strength every 6 hours  Colds/Coughs/Allergies: Benadryl (alcohol free) 25 mg every 6 hours as needed Breath right strips Claritin Cepacol throat lozenges Chloraseptic throat spray Cold-Eeze- up to three times per day Cough drops, alcohol free Flonase (by prescription only) Guaifenesin Mucinex Robitussin DM (plain only, alcohol free) Saline nasal spray/drops Sudafed (pseudoephedrine) & Actifed ** use only after [redacted] weeks gestation and if you do not have high blood pressure Tylenol Vicks Vaporub Zinc lozenges Zyrtec   Constipation: Colace Ducolax suppositories Fleet enema Glycerin suppositories Metamucil Milk of magnesia Miralax Senokot Smooth move tea  Diarrhea: Kaopectate Imodium A-D  *NO pepto Bismol  Hemorrhoids: Anusol Anusol HC Preparation  H Tucks  Indigestion: Tums Maalox Mylanta Zantac  Pepcid  Insomnia: Benadryl (alcohol free) 25mg every 6 hours as needed Tylenol PM Unisom, no Gelcaps  Leg Cramps: Tums MagGel  Nausea/Vomiting:  Bonine Dramamine Emetrol Ginger extract Sea bands Meclizine  Nausea medication to take during pregnancy:  Unisom (doxylamine succinate 25 mg tablets) Take one tablet daily at bedtime. If symptoms are not adequately controlled, the dose can be increased to a maximum recommended dose of two tablets daily (1/2 tablet in the morning, 1/2 tablet mid-afternoon and one at bedtime). Vitamin B6 100mg tablets. Take one tablet twice a day (up to 200 mg per day).  Skin Rashes: Aveeno products Benadryl cream or 25mg every 6 hours as needed Calamine Lotion 1% cortisone cream  Yeast infection: Gyne-lotrimin 7 Monistat 7   **If taking multiple medications, please check labels to avoid duplicating the same active ingredients **take medication as directed on the label ** Do not exceed 4000 mg of tylenol in 24 hours **Do not take medications that contain aspirin or ibuprofen      

## 2015-12-04 NOTE — Progress Notes (Signed)
error 

## 2015-12-05 LAB — URINE CULTURE: Organism ID, Bacteria: NO GROWTH

## 2015-12-06 DIAGNOSIS — Z029 Encounter for administrative examinations, unspecified: Secondary | ICD-10-CM

## 2015-12-06 LAB — GC/CHLAMYDIA PROBE AMP
Chlamydia trachomatis, NAA: NEGATIVE
NEISSERIA GONORRHOEAE BY PCR: NEGATIVE

## 2015-12-07 ENCOUNTER — Other Ambulatory Visit: Payer: Self-pay | Admitting: Advanced Practice Midwife

## 2015-12-07 ENCOUNTER — Telehealth: Payer: Self-pay | Admitting: Advanced Practice Midwife

## 2015-12-07 DIAGNOSIS — Z3682 Encounter for antenatal screening for nuchal translucency: Secondary | ICD-10-CM

## 2015-12-07 NOTE — Telephone Encounter (Signed)
Pt informed of WNL labs from 12/04/2015 and varicella non immune. Pt verbalized understanding.

## 2015-12-07 NOTE — Telephone Encounter (Signed)
Pt called stating that she would like to know the results of her blood work. Please contact pt °

## 2015-12-10 ENCOUNTER — Ambulatory Visit (INDEPENDENT_AMBULATORY_CARE_PROVIDER_SITE_OTHER): Payer: BLUE CROSS/BLUE SHIELD

## 2015-12-10 ENCOUNTER — Other Ambulatory Visit: Payer: BLUE CROSS/BLUE SHIELD

## 2015-12-10 DIAGNOSIS — Z36 Encounter for antenatal screening of mother: Secondary | ICD-10-CM | POA: Diagnosis not present

## 2015-12-10 DIAGNOSIS — Z3A13 13 weeks gestation of pregnancy: Secondary | ICD-10-CM | POA: Diagnosis not present

## 2015-12-10 DIAGNOSIS — Z3682 Encounter for antenatal screening for nuchal translucency: Secondary | ICD-10-CM

## 2015-12-10 DIAGNOSIS — Z369 Encounter for antenatal screening, unspecified: Secondary | ICD-10-CM

## 2015-12-10 NOTE — Progress Notes (Signed)
[place] 13 wks,measurements c/w dates,normal ov's bilat,NB present,NT 1.4 mm,crl 68.4 mm

## 2015-12-12 ENCOUNTER — Encounter: Payer: Self-pay | Admitting: Advanced Practice Midwife

## 2015-12-12 DIAGNOSIS — Z3490 Encounter for supervision of normal pregnancy, unspecified, unspecified trimester: Secondary | ICD-10-CM | POA: Insufficient documentation

## 2015-12-12 LAB — RUBELLA SCREEN: RUBELLA: 1.82 {index} (ref >0.99)

## 2015-12-12 LAB — ABO/RH: Rh Factor: POSITIVE

## 2015-12-12 LAB — RPR: RPR: NONREACTIVE

## 2015-12-12 LAB — URINALYSIS, ROUTINE W REFLEX MICROSCOPIC
BILIRUBIN UA: NEGATIVE
GLUCOSE, UA: NEGATIVE
Ketones, UA: NEGATIVE
LEUKOCYTES UA: NEGATIVE
Nitrite, UA: NEGATIVE
PH UA: 7.5 (ref 5.0–7.5)
PROTEIN UA: NEGATIVE
RBC UA: NEGATIVE
Specific Gravity, UA: 1.02 (ref 1.005–1.030)
Urobilinogen, Ur: 1 mg/dL (ref 0.2–1.0)

## 2015-12-12 LAB — PMP SCREEN PROFILE (10S), URINE
AMPHETAMINE SCRN UR: NEGATIVE ng/mL
Barbiturate Screen, Ur: NEGATIVE ng/mL
Benzodiazepine Screen, Urine: NEGATIVE ng/mL
CANNABINOIDS UR QL SCN: NEGATIVE ng/mL
Cocaine(Metab.)Screen, Urine: NEGATIVE ng/mL
Creatinine(Crt), U: 151.7 mg/dL (ref 20.0–300.0)
METHADONE SCREEN, URINE: NEGATIVE ng/mL
OXYCODONE+OXYMORPHONE UR QL SCN: NEGATIVE ng/mL
Opiate Scrn, Ur: NEGATIVE ng/mL
PCP SCRN UR: NEGATIVE ng/mL
PH UR, DRUG SCRN: 8.3 (ref 4.5–8.9)
PROPOXYPHENE SCREEN: NEGATIVE ng/mL

## 2015-12-12 LAB — HIV ANTIBODY (ROUTINE TESTING W REFLEX): HIV Screen 4th Generation wRfx: NONREACTIVE

## 2015-12-12 LAB — CBC
HEMATOCRIT: 39 % (ref 34.0–46.6)
Hemoglobin: 13.1 g/dL (ref 11.1–15.9)
MCH: 28.8 pg (ref 26.6–33.0)
MCHC: 33.6 g/dL (ref 31.5–35.7)
MCV: 86 fL (ref 79–97)
PLATELETS: 244 10*3/uL (ref 150–379)
RBC: 4.55 x10E6/uL (ref 3.77–5.28)
RDW: 14.2 % (ref 12.3–15.4)
WBC: 5.3 10*3/uL (ref 3.4–10.8)

## 2015-12-12 LAB — CYSTIC FIBROSIS MUTATION 97
CF IMAGE: 0
Interpretation: NOT DETECTED

## 2015-12-12 LAB — HEPATITIS B SURFACE ANTIGEN: HEP B S AG: NEGATIVE

## 2015-12-12 LAB — ANTIBODY SCREEN: ANTIBODY SCREEN: NEGATIVE

## 2015-12-12 LAB — VARICELLA ZOSTER ANTIBODY, IGG: Varicella zoster IgG: <135 index — ABNORMAL LOW (ref >165)

## 2015-12-13 LAB — MATERNAL SCREEN, INTEGRATED #1
CROWN RUMP LENGTH MAT SCREEN: 68.4 mm
GEST. AGE ON COLLECTION DATE: 12.9 wk
MATERNAL AGE AT EDD: 23.9 a
NUCHAL TRANSLUCENCY (NT): 1.4 mm
NUMBER OF FETUSES: 1
PAPP-A VALUE: 328.3 ng/mL
PDF: 0
Weight: 118 [lb_av]

## 2015-12-25 ENCOUNTER — Telehealth: Payer: Self-pay | Admitting: Advanced Practice Midwife

## 2015-12-25 NOTE — Telephone Encounter (Signed)
Pt states she is having heartburn/indigestion, advised pt to try Tums or Zantac.  Pt verbalized understanding

## 2015-12-28 ENCOUNTER — Encounter: Payer: Self-pay | Admitting: Adult Health

## 2015-12-28 ENCOUNTER — Ambulatory Visit (INDEPENDENT_AMBULATORY_CARE_PROVIDER_SITE_OTHER): Payer: BLUE CROSS/BLUE SHIELD | Admitting: Adult Health

## 2015-12-28 VITALS — BP 88/50 | HR 92 | Wt 121.0 lb

## 2015-12-28 DIAGNOSIS — N949 Unspecified condition associated with female genital organs and menstrual cycle: Secondary | ICD-10-CM

## 2015-12-28 DIAGNOSIS — R12 Heartburn: Secondary | ICD-10-CM

## 2015-12-28 DIAGNOSIS — Z1389 Encounter for screening for other disorder: Secondary | ICD-10-CM

## 2015-12-28 DIAGNOSIS — O26892 Other specified pregnancy related conditions, second trimester: Principal | ICD-10-CM

## 2015-12-28 DIAGNOSIS — Z331 Pregnant state, incidental: Secondary | ICD-10-CM | POA: Diagnosis not present

## 2015-12-28 DIAGNOSIS — B379 Candidiasis, unspecified: Secondary | ICD-10-CM | POA: Diagnosis not present

## 2015-12-28 DIAGNOSIS — N898 Other specified noninflammatory disorders of vagina: Secondary | ICD-10-CM

## 2015-12-28 LAB — POCT URINALYSIS DIPSTICK
GLUCOSE UA: NEGATIVE
KETONES UA: NEGATIVE
NITRITE UA: NEGATIVE
Protein, UA: NEGATIVE

## 2015-12-28 LAB — POCT WET PREP (WET MOUNT): WBC WET PREP: POSITIVE

## 2015-12-28 MED ORDER — MICONAZOLE NITRATE APPLICATOR 100 & 2 MG-% (9GM) VA KIT: 1.0000 | PACK | Freq: Every day | VAGINAL | Status: DC

## 2015-12-28 MED ORDER — OMEPRAZOLE MAGNESIUM 20 MG PO TBEC: 20.0000 mg | DELAYED_RELEASE_TABLET | Freq: Every day | ORAL | Status: DC

## 2015-12-28 NOTE — Progress Notes (Signed)
G1P0000 5987w4d Estimated Date of Delivery: 06/16/16 Work in appt  Blood pressure (!) 88/50, pulse 92, weight 121 lb (54.9 kg), last menstrual period 09/18/2015.   BP weight and urine results all reviewed and noted.  Please refer to the obstetrical flow sheet for the fundal height and fetal heart rate documentation:  Patient denies any bleeding and no rupture of membranes symptoms or regular contractions. Patient is complaining of vaginal discharge with itching and burning, on exam vaginal side walls red and has clumpy white discharge, wet prep: +WBCs and + yeast. Has irritation on labia too. All questions were answered.  Orders Placed This Encounter  Procedures  . POCT Urinalysis Dipstick  Impression: yeast infection   Plan:  Continued routine obstetrical care Use monistat use daily for 7 days   Return as scheduled

## 2015-12-28 NOTE — Patient Instructions (Signed)

## 2016-01-01 ENCOUNTER — Ambulatory Visit (INDEPENDENT_AMBULATORY_CARE_PROVIDER_SITE_OTHER): Payer: BLUE CROSS/BLUE SHIELD | Admitting: Advanced Practice Midwife

## 2016-01-01 VITALS — BP 90/52 | HR 90 | Wt 122.0 lb

## 2016-01-01 DIAGNOSIS — Z331 Pregnant state, incidental: Secondary | ICD-10-CM

## 2016-01-01 DIAGNOSIS — Z3492 Encounter for supervision of normal pregnancy, unspecified, second trimester: Secondary | ICD-10-CM

## 2016-01-01 DIAGNOSIS — Z363 Encounter for antenatal screening for malformations: Secondary | ICD-10-CM

## 2016-01-01 DIAGNOSIS — Z1389 Encounter for screening for other disorder: Secondary | ICD-10-CM

## 2016-01-01 DIAGNOSIS — Z3682 Encounter for antenatal screening for nuchal translucency: Secondary | ICD-10-CM

## 2016-01-01 LAB — POCT URINALYSIS DIPSTICK
Glucose, UA: NEGATIVE
Ketones, UA: NEGATIVE
LEUKOCYTES UA: NEGATIVE
NITRITE UA: NEGATIVE
PROTEIN UA: NEGATIVE
RBC UA: NEGATIVE

## 2016-01-01 NOTE — Progress Notes (Signed)
G1P0000 8132w1d Estimated Date of Delivery: 06/16/16  Last menstrual period 09/18/2015.   BP weight and urine results all reviewed and noted.  Please refer to the obstetrical flow sheet for the fundal height and fetal heart rate documentation:  Patient denies any bleeding and no rupture of membranes symptoms or regular contractions. Patient is without complaints. All questions were answered.  Orders Placed This Encounter  Procedures  . US OB Comp + 14 Wk    Plan:  Continued routine obstetrical care, 2nd IT  Return in about 3 weeks (around 01/22/2016) for LROB, ZO:[id number]:Anatomy.

## 2016-01-01 NOTE — Patient Instructions (Signed)

## 2016-01-03 LAB — MATERNAL SCREEN, INTEGRATED #2
AFP MARKER: 29.8 ng/mL
AFP MOM: 0.86
Crown Rump Length: 68.4 mm
DIA MOM: 0.9
DIA Value: 182.6 pg/mL
ESTRIOL UNCONJUGATED: 0.51 ng/mL
GEST. AGE ON COLLECTION DATE: 12.9 wk
GESTATIONAL AGE: 16 wk
HCG MOM: 1.67
HCG VALUE: 64.8 [IU]/mL
MATERNAL AGE AT EDD: 23.9 a
NUCHAL TRANSLUCENCY MOM: 0.83
Nuchal Translucency (NT): 1.4 mm
Number of Fetuses: 1
PAPP-A MOM: 0.23
PAPP-A Value: 328.3 ng/mL
Test Results:: NEGATIVE
WEIGHT: 118 [lb_av]
WEIGHT: 122 [lb_av]
uE3 MoM: 0.6

## 2016-01-07 ENCOUNTER — Telehealth: Payer: Self-pay | Admitting: Obstetrics & Gynecology

## 2016-01-07 NOTE — Telephone Encounter (Signed)
Pt informed Maternal screening from 01/01/2016 negative. Pt verbalized understanding.

## 2016-01-07 NOTE — Telephone Encounter (Signed)
Pt called stating that she would like a call back from a nurse regarding her results. Please contact pt

## 2016-01-22 ENCOUNTER — Ambulatory Visit (INDEPENDENT_AMBULATORY_CARE_PROVIDER_SITE_OTHER): Payer: BLUE CROSS/BLUE SHIELD | Admitting: Women's Health

## 2016-01-22 ENCOUNTER — Ambulatory Visit (INDEPENDENT_AMBULATORY_CARE_PROVIDER_SITE_OTHER): Payer: BLUE CROSS/BLUE SHIELD

## 2016-01-22 ENCOUNTER — Encounter: Payer: Self-pay | Admitting: Women's Health

## 2016-01-22 VITALS — BP 116/60 | HR 72 | Wt 125.0 lb

## 2016-01-22 DIAGNOSIS — Z3402 Encounter for supervision of normal first pregnancy, second trimester: Secondary | ICD-10-CM

## 2016-01-22 DIAGNOSIS — Z3A20 20 weeks gestation of pregnancy: Secondary | ICD-10-CM

## 2016-01-22 DIAGNOSIS — Z363 Encounter for antenatal screening for malformations: Secondary | ICD-10-CM

## 2016-01-22 DIAGNOSIS — Z23 Encounter for immunization: Secondary | ICD-10-CM | POA: Diagnosis not present

## 2016-01-22 DIAGNOSIS — Z331 Pregnant state, incidental: Secondary | ICD-10-CM

## 2016-01-22 DIAGNOSIS — Z1389 Encounter for screening for other disorder: Secondary | ICD-10-CM

## 2016-01-22 LAB — POCT URINALYSIS DIPSTICK
Glucose, UA: NEGATIVE
KETONES UA: NEGATIVE
Leukocytes, UA: NEGATIVE
Nitrite, UA: NEGATIVE
Protein, UA: NEGATIVE
RBC UA: NEGATIVE

## 2016-01-22 NOTE — Progress Notes (Signed)
Low-risk OB appointment G1P0000 2687w1d Estimated Date of Delivery: 06/16/16 BP 116/60   Pulse 72   Wt 125 lb (56.7 kg)   LMP 09/18/2015 (Approximate)   BMI 21.46 kg/m   BP, weight, and urine reviewed.  Refer to obstetrical flow sheet for FH & FHR.  Reports good fm.  Denies regular uc's, lof, vb, or uti s/s. No complaints. Reviewed today's normal anatomy u/s, normal nt/it results, warning s/s to report, fm. Plan:  Continue routine obstetrical care  F/U in 4wks for OB appointment  Flu shot today

## 2016-01-22 NOTE — Patient Instructions (Signed)
[place] Pediatricians/Family Doctors:  [facility] [phone]                 [place] [phone] (usually not accepting new patients unless you have family there already, you are always welcome to call and ask)            [facility] ([place]) [phone]   [facility] Pediatricians/Family Doctors:   [place]: [phone]  [facility]: [phone]   Second Trimester of Pregnancy The second trimester is from week 13 through week 28, months 4 through 6. The second trimester is often a time when you feel your best. Your body has also adjusted to being pregnant, and you begin to feel better physically. Usually, morning sickness has lessened or quit completely, you may have more energy, and you may have an increase in appetite. The second trimester is also a time when the fetus is growing rapidly. At the end of the sixth month, the fetus is about 9 inches long and weighs about 1 pounds. You will likely begin to feel the baby move (quickening) between 18 and 20 weeks of the pregnancy. BODY CHANGES Your body goes through many changes during pregnancy. The changes vary from woman to woman.   Your weight will continue to increase. You will notice your lower abdomen bulging out.  You may begin to get stretch marks on your hips, abdomen, and breasts.  You may develop headaches that can be relieved by medicines approved by your health care provider.  You may urinate more often because the fetus is pressing on your bladder.  You may develop or continue to have heartburn as a result of your pregnancy.  You may develop constipation because certain hormones are causing the muscles that push waste through your intestines to slow down.  You may develop hemorrhoids or swollen, bulging veins (varicose veins).  You may have back pain because of the weight  gain and pregnancy hormones relaxing your joints between the bones in your pelvis and as a result of a shift in weight and the muscles that support your balance.  Your breasts will continue to grow and be tender.  Your gums may bleed and may be sensitive to brushing and flossing.  Dark spots or blotches (chloasma, mask of pregnancy) may develop on your face. This will likely fade after the baby is born.  A dark line from your belly button to the pubic area (linea nigra) may appear. This will likely fade after the baby is born.  You may have changes in your hair. These can include thickening of your hair, rapid growth, and changes in texture. Some women also have hair loss during or after pregnancy, or hair that feels dry or thin. Your hair will most likely return to normal after your baby is born. WHAT TO EXPECT AT YOUR PRENATAL VISITS During a routine prenatal visit:  You will be weighed to make sure you and the fetus are growing normally.  Your blood pressure will be taken.  Your abdomen will be measured to track your baby's growth.  The fetal heartbeat will be listened to.  Any test results from the previous visit will be discussed. Your health care provider may ask you:  How you are feeling.  If you are feeling the baby move.  If you have had any abnormal symptoms, such as leaking fluid, bleeding, severe  headaches, or abdominal cramping.  If you are using any tobacco products, including cigarettes, chewing tobacco, and electronic cigarettes.  If you have any questions. Other tests that may be performed during your second trimester include:  Blood tests that check for:  Low iron levels (anemia).  Gestational diabetes (between 24 and 28 weeks).  Rh antibodies.  Urine tests to check for infections, diabetes, or protein in the urine.  An ultrasound to confirm the proper growth and development of the baby.  An amniocentesis to check for possible genetic  problems.  Fetal screens for spina bifida and Down syndrome.  HIV (human immunodeficiency virus) testing. Routine prenatal testing includes screening for HIV, unless you choose not to have this test. HOME CARE INSTRUCTIONS   Avoid all smoking, herbs, alcohol, and unprescribed drugs. These chemicals affect the formation and growth of the baby.  Do not use any tobacco products, including cigarettes, chewing tobacco, and electronic cigarettes. If you need help quitting, ask your health care provider. You may receive counseling support and other resources to help you quit.  Follow your health care provider's instructions regarding medicine use. There are medicines that are either safe or unsafe to take during pregnancy.  Exercise only as directed by your health care provider. Experiencing uterine cramps is a good sign to stop exercising.  Continue to eat regular, healthy meals.  Wear a good support bra for breast tenderness.  Do not use hot tubs, steam rooms, or saunas.  Wear your seat belt at all times when driving.  Avoid raw meat, uncooked cheese, cat litter boxes, and soil used by cats. These carry germs that can cause birth defects in the baby.  Take your prenatal vitamins.  Take 1500-2000 mg of calcium daily starting at the 20th week of pregnancy until you deliver your baby.  Try taking a stool softener (if your health care provider approves) if you develop constipation. Eat more high-fiber foods, such as fresh vegetables or fruit and whole grains. Drink plenty of fluids to keep your urine clear or pale yellow.  Take warm sitz baths to soothe any pain or discomfort caused by hemorrhoids. Use hemorrhoid cream if your health care provider approves.  If you develop varicose veins, wear support hose. Elevate your feet for 15 minutes, 3-4 times a day. Limit salt in your diet.  Avoid heavy lifting, wear low heel shoes, and practice good posture.  Rest with your legs elevated if you  have leg cramps or low back pain.  Visit your dentist if you have not gone yet during your pregnancy. Use a soft toothbrush to brush your teeth and be gentle when you floss.  A sexual relationship may be continued unless your health care provider directs you otherwise.  Continue to go to all your prenatal visits as directed by your health care provider. SEEK MEDICAL CARE IF:   You have dizziness.  You have mild pelvic cramps, pelvic pressure, or nagging pain in the abdominal area.  You have persistent nausea, vomiting, or diarrhea.  You have a bad smelling vaginal discharge.  You have pain with urination. SEEK IMMEDIATE MEDICAL CARE IF:   You have a fever.  You are leaking fluid from your vagina.  You have spotting or bleeding from your vagina.  You have severe abdominal cramping or pain.  You have rapid weight gain or loss.  You have shortness of breath with chest pain.  You notice sudden or extreme swelling of your face, hands, ankles, feet, or legs.  You have not felt your baby move in over an hour.  You have severe headaches that do not go away with medicine.  You have vision changes.   This information is not intended to replace advice given to you by your health care provider. Make sure you discuss any questions you have with your health care provider.   Document Released: 04/01/2001 Document Revised: 04/28/2014 Document Reviewed: 06/08/2012 Elsevier Interactive Patient Education 2016 [employer]  Levonorgestrel intrauterine device (IUD) What is this medicine? LEVONORGESTREL IUD (LEE voe nor jes trel) is a contraceptive (birth control) device. The device is placed inside the uterus by a healthcare professional. It is used to prevent pregnancy and can also be used to treat heavy bleeding that occurs during your period. Depending on the device, it can be used for 3 to 5 years. This medicine may be used for other purposes; ask your health care provider or  pharmacist if you have questions. What should I tell my health care provider before I take this medicine? They need to know if you have any of these conditions: -abnormal Pap smear -cancer of the breast, uterus, or cervix -diabetes -endometritis -genital or pelvic infection now or in the past -have more than one sexual partner or your partner has more than one partner -heart disease -history of an ectopic or tubal pregnancy -immune system problems -IUD in place -liver disease or tumor -problems with blood clots or take blood-thinners -use intravenous drugs -uterus of unusual shape -vaginal bleeding that has not been explained -an unusual or allergic reaction to levonorgestrel, other hormones, silicone, or polyethylene, medicines, foods, dyes, or preservatives -pregnant or trying to get pregnant -breast-feeding How should I use this medicine? This device is placed inside the uterus by a health care professional. Talk to your pediatrician regarding the use of this medicine in children. Special care may be needed. Overdosage: If you think you have taken too much of this medicine contact a poison control center or emergency room at once. NOTE: This medicine is only for you. Do not share this medicine with others. What if I miss a dose? This does not apply. What may interact with this medicine? Do not take this medicine with any of the following medications: -amprenavir -bosentan -fosamprenavir This medicine may also interact with the following medications: -aprepitant -barbiturate medicines for inducing sleep or treating seizures -bexarotene -griseofulvin -medicines to treat seizures like carbamazepine, ethotoin, felbamate, oxcarbazepine, phenytoin, topiramate -modafinil -pioglitazone -rifabutin -rifampin -rifapentine -some medicines to treat HIV infection like atazanavir, indinavir, lopinavir, nelfinavir, tipranavir, ritonavir -St. John's wort -warfarin This list may not  describe all possible interactions. Give your health care provider a list of all the medicines, herbs, non-prescription drugs, or dietary supplements you use. Also tell them if you smoke, drink alcohol, or use illegal drugs. Some items may interact with your medicine. What should I watch for while using this medicine? Visit your doctor or health care professional for regular check ups. See your doctor if you or your partner has sexual contact with others, becomes HIV positive, or gets a sexual transmitted disease. This product does not protect you against HIV infection (AIDS) or other sexually transmitted diseases. You can check the placement of the IUD yourself by reaching up to the top of your vagina with clean fingers to feel the threads. Do not pull on the threads. It is a good habit to check placement after each menstrual period. Call your doctor right away if you feel more of the IUD than  just the threads or if you cannot feel the threads at all. The IUD may come out by itself. You may become pregnant if the device comes out. If you notice that the IUD has come out use a backup birth control method like condoms and call your health care provider. Using tampons will not change the position of the IUD and are okay to use during your period. What side effects may I notice from receiving this medicine? Side effects that you should report to your doctor or health care professional as soon as possible: -allergic reactions like skin rash, itching or hives, swelling of the face, lips, or tongue -fever, flu-like symptoms -genital sores -high blood pressure -no menstrual period for 6 weeks during use -pain, swelling, warmth in the leg -pelvic pain or tenderness -severe or sudden headache -signs of pregnancy -stomach cramping -sudden shortness of breath -trouble with balance, talking, or walking -unusual vaginal bleeding, discharge -yellowing of the eyes or skin Side effects that usually do not  require medical attention (report to your doctor or health care professional if they continue or are bothersome): -acne -breast pain -change in sex drive or performance -changes in weight -cramping, dizziness, or faintness while the device is being inserted -headache -irregular menstrual bleeding within first 3 to 6 months of use -nausea This list may not describe all possible side effects. Call your doctor for medical advice about side effects. You may report side effects to FDA at 1-800-FDA-1088. Where should I keep my medicine? This does not apply. NOTE: This sheet is a summary. It may not cover all possible information. If you have questions about this medicine, talk to your doctor, pharmacist, or health care provider.    2016, Elsevier/Gold Standard. (2011-05-08 13:54:04)

## 2016-01-22 NOTE — Progress Notes (Signed)
US 19+1 wks,measurements c/w dates,cx 3.5 cm,ant pl gr 0,normal ov's bilat,cephalic,svp of fluid 3.5 cm,fhr 142 bpm,efw 269 g, anatomy complete,no obvious abnormalities seen

## 2016-02-07 ENCOUNTER — Telehealth: Payer: Self-pay | Admitting: Women's Health

## 2016-02-07 NOTE — Telephone Encounter (Signed)
Patient called stating she needs note for work stating no lifting or operating machinery.  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  Note can be faxed or picked up.

## 2016-02-08 NOTE — Telephone Encounter (Signed)
Called to let patient know note was left at front desk for her to pick up. States her mom will pick up today before office hours end at 2pm.

## 2016-02-18 ENCOUNTER — Encounter: Payer: Self-pay | Admitting: Women's Health

## 2016-02-18 ENCOUNTER — Ambulatory Visit (INDEPENDENT_AMBULATORY_CARE_PROVIDER_SITE_OTHER): Payer: BLUE CROSS/BLUE SHIELD | Admitting: Women's Health

## 2016-02-18 VITALS — BP 96/54 | HR 84 | Wt 133.0 lb

## 2016-02-18 DIAGNOSIS — Z1389 Encounter for screening for other disorder: Secondary | ICD-10-CM | POA: Diagnosis not present

## 2016-02-18 DIAGNOSIS — Z3402 Encounter for supervision of normal first pregnancy, second trimester: Secondary | ICD-10-CM

## 2016-02-18 DIAGNOSIS — N898 Other specified noninflammatory disorders of vagina: Secondary | ICD-10-CM

## 2016-02-18 DIAGNOSIS — Z331 Pregnant state, incidental: Secondary | ICD-10-CM | POA: Diagnosis not present

## 2016-02-18 DIAGNOSIS — L292 Pruritus vulvae: Secondary | ICD-10-CM

## 2016-02-18 LAB — POCT URINALYSIS DIPSTICK
Blood, UA: NEGATIVE
GLUCOSE UA: NEGATIVE
KETONES UA: NEGATIVE
Nitrite, UA: NEGATIVE
PROTEIN UA: NEGATIVE

## 2016-02-18 LAB — POCT WET PREP (WET MOUNT)
Clue Cells Wet Prep Whiff POC: NEGATIVE
Trichomonas Wet Prep HPF POC: ABSENT

## 2016-02-18 NOTE — Progress Notes (Signed)
Low-risk OB appointment [id number] Estimated Date of Delivery: 06/16/16 BP (!) 96/54   Pulse 84   Wt 133 lb (60.3 kg)   LMP 09/18/2015 (Approximate)   BMI 22.83 kg/m   BP, weight, and urine reviewed.  Refer to obstetrical flow sheet for FH & FHR.  Reports good fm.  Denies regular uc's, lof, vb, or uti s/s. Vulvar itching/redness x few days, has had frequent yeast infections.  Spec exam: cx visually closed, mod amt thick clumpy yellow d/c adherent to vag walls, wet prep +yeast, use otc monistat 7 Reviewed ptl s/s, fm. Plan:  Continue routine obstetrical care  F/U in 4wks for OB appointment and pn2

## 2016-02-18 NOTE — Patient Instructions (Addendum)
Monistat 7 yeast cream for yeast infection  You will have your sugar test next visit.  Please do not eat or drink anything after midnight the night before you come, not even water.  You will be here for at least two hours.     Call the office [phone]) or go to [facility] if:  You begin to have strong, frequent contractions  Your water breaks.  Sometimes it is a big gush of fluid, sometimes it is just a trickle that keeps getting your panties wet or running down your legs  You have vaginal bleeding.  It is normal to have a small amount of spotting if your cervix was checked.   You don't feel your baby moving like normal.  If you don't, get you something to eat and drink and lay down and focus on feeling your baby move.   If your baby is still not moving like normal, you should call the office or go to [facility].  Second Trimester of Pregnancy The second trimester is from week 13 through week 28, months 4 through 6. The second trimester is often a time when you feel your best. Your body has also adjusted to being pregnant, and you begin to feel better physically. Usually, morning sickness has lessened or quit completely, you may have more energy, and you may have an increase in appetite. The second trimester is also a time when the fetus is growing rapidly. At the end of the sixth month, the fetus is about 9 inches long and weighs about 1 pounds. You will likely begin to feel the baby move (quickening) between 18 and 20 weeks of the pregnancy. BODY CHANGES Your body goes through many changes during pregnancy. The changes vary from woman to woman.   Your weight will continue to increase. You will notice your lower abdomen bulging out.  You may begin to get stretch marks on your hips, abdomen, and breasts.  You may develop headaches that can be relieved by medicines approved by your health care provider.  You may urinate more often because the fetus is pressing on your  bladder.  You may develop or continue to have heartburn as a result of your pregnancy.  You may develop constipation because certain hormones are causing the muscles that push waste through your intestines to slow down.  You may develop hemorrhoids or swollen, bulging veins (varicose veins).  You may have back pain because of the weight gain and pregnancy hormones relaxing your joints between the bones in your pelvis and as a result of a shift in weight and the muscles that support your balance.  Your breasts will continue to grow and be tender.  Your gums may bleed and may be sensitive to brushing and flossing.  Dark spots or blotches (chloasma, mask of pregnancy) may develop on your face. This will likely fade after the baby is born.  A dark line from your belly button to the pubic area (linea nigra) may appear. This will likely fade after the baby is born.  You may have changes in your hair. These can include thickening of your hair, rapid growth, and changes in texture. Some women also have hair loss during or after pregnancy, or hair that feels dry or thin. Your hair will most likely return to normal after your baby is born. WHAT TO EXPECT AT YOUR PRENATAL VISITS During a routine prenatal visit:  You will be weighed to make sure you and the fetus are growing normally.  Your blood pressure will be taken.  Your abdomen will be measured to track your baby's growth.  The fetal heartbeat will be listened to.  Any test results from the previous visit will be discussed. Your health care provider may ask you:  How you are feeling.  If you are feeling the baby move.  If you have had any abnormal symptoms, such as leaking fluid, bleeding, severe headaches, or abdominal cramping.  If you have any questions. Other tests that may be performed during your second trimester include:  Blood tests that check for:  Low iron levels (anemia).  Gestational diabetes (between 24 and 28  weeks).  Rh antibodies.  Urine tests to check for infections, diabetes, or protein in the urine.  An ultrasound to confirm the proper growth and development of the baby.  An amniocentesis to check for possible genetic problems.  Fetal screens for spina bifida and Down syndrome. HOME CARE INSTRUCTIONS   Avoid all smoking, herbs, alcohol, and unprescribed drugs. These chemicals affect the formation and growth of the baby.  Follow your health care provider's instructions regarding medicine use. There are medicines that are either safe or unsafe to take during pregnancy.  Exercise only as directed by your health care provider. Experiencing uterine cramps is a good sign to stop exercising.  Continue to eat regular, healthy meals.  Wear a good support bra for breast tenderness.  Do not use hot tubs, steam rooms, or saunas.  Wear your seat belt at all times when driving.  Avoid raw meat, uncooked cheese, cat litter boxes, and soil used by cats. These carry germs that can cause birth defects in the baby.  Take your prenatal vitamins.  Try taking a stool softener (if your health care provider approves) if you develop constipation. Eat more high-fiber foods, such as fresh vegetables or fruit and whole grains. Drink plenty of fluids to keep your urine clear or pale yellow.  Take warm sitz baths to soothe any pain or discomfort caused by hemorrhoids. Use hemorrhoid cream if your health care provider approves.  If you develop varicose veins, wear support hose. Elevate your feet for 15 minutes, 3-4 times a day. Limit salt in your diet.  Avoid heavy lifting, wear low heel shoes, and practice good posture.  Rest with your legs elevated if you have leg cramps or low back pain.  Visit your dentist if you have not gone yet during your pregnancy. Use a soft toothbrush to brush your teeth and be gentle when you floss.  A sexual relationship may be continued unless your health care provider  directs you otherwise.  Continue to go to all your prenatal visits as directed by your health care provider. SEEK MEDICAL CARE IF:   You have dizziness.  You have mild pelvic cramps, pelvic pressure, or nagging pain in the abdominal area.  You have persistent nausea, vomiting, or diarrhea.  You have a bad smelling vaginal discharge.  You have pain with urination. SEEK IMMEDIATE MEDICAL CARE IF:   You have a fever.  You are leaking fluid from your vagina.  You have spotting or bleeding from your vagina.  You have severe abdominal cramping or pain.  You have rapid weight gain or loss.  You have shortness of breath with chest pain.  You notice sudden or extreme swelling of your face, hands, ankles, feet, or legs.  You have not felt your baby move in over an hour.  You have severe headaches that do not go away  with medicine.  You have vision changes. Document Released: 04/01/2001 Document Revised: 04/12/2013 Document Reviewed: 06/08/2012 [facility] Patient Information 2015 [employer] [place]. This information is not intended to replace advice given to you by your health care provider. Make sure you discuss any questions you have with your health care provider.      Monilial Vaginitis Vaginitis in a soreness, swelling and redness (inflammation) of the vagina and vulva. Monilial vaginitis is not a sexually transmitted infection. CAUSES  Yeast vaginitis is caused by yeast (candida) that is normally found in your vagina. With a yeast infection, the candida has overgrown in number to a point that upsets the chemical balance. SYMPTOMS   White, thick vaginal discharge.  Swelling, itching, redness and irritation of the vagina and possibly the lips of the vagina (vulva).  Burning or painful urination.  Painful intercourse. DIAGNOSIS  Things that may contribute to monilial vaginitis are:  Postmenopausal and virginal states.  Pregnancy.  Infections.  Being tired, sick or  stressed, especially if you had monilial vaginitis in the past.  Diabetes. Good control will help lower the chance.  Birth control pills.  Tight fitting garments.  Using bubble bath, feminine sprays, douches or deodorant tampons.  Taking certain medications that kill germs (antibiotics).  Sporadic recurrence can occur if you become ill. TREATMENT  Your caregiver will give you medication.  There are several kinds of anti monilial vaginal creams and suppositories specific for monilial vaginitis. For recurrent yeast infections, use a suppository or cream in the vagina 2 times a week, or as directed.  Anti-monilial or steroid cream for the itching or irritation of the vulva may also be used. Get your caregiver's permission.  Painting the vagina with methylene blue solution may help if the monilial cream does not work.  Eating yogurt may help prevent monilial vaginitis. HOME CARE INSTRUCTIONS   Finish all medication as prescribed.  Do not have sex until treatment is completed or after your caregiver tells you it is okay.  Take warm sitz baths.  Do not douche.  Do not use tampons, especially scented ones.  Wear cotton underwear.  Avoid tight pants and panty hose.  Tell your sexual partner that you have a yeast infection. They should go to their caregiver if they have symptoms such as mild rash or itching.  Your sexual partner should be treated as well if your infection is difficult to eliminate.  Practice safer sex. Use condoms.  Some vaginal medications cause latex condoms to fail. Vaginal medications that harm condoms are:  Cleocin cream.  Butoconazole (Femstat).  Terconazole (Terazol) vaginal suppository.  Miconazole (Monistat) (may be purchased over the counter). SEEK MEDICAL CARE IF:   You have a temperature by mouth above 102 F (38.9 C).  The infection is getting worse after 2 days of treatment.  The infection is not getting better after 3 days of  treatment.  You develop blisters in or around your vagina.  You develop vaginal bleeding, and it is not your menstrual period.  You have pain when you urinate.  You develop intestinal problems.  You have pain with sexual intercourse.   This information is not intended to replace advice given to you by your health care provider. Make sure you discuss any questions you have with your health care provider.   Document Released: 01/15/2005 Document Revised: 06/30/2011 Document Reviewed: 10/09/2014 Elsevier Interactive Patient Education [employer]2016 Elsevier Inc.

## 2016-03-17 ENCOUNTER — Ambulatory Visit (INDEPENDENT_AMBULATORY_CARE_PROVIDER_SITE_OTHER): Payer: BLUE CROSS/BLUE SHIELD | Admitting: Obstetrics and Gynecology

## 2016-03-17 ENCOUNTER — Encounter: Payer: Self-pay | Admitting: Obstetrics and Gynecology

## 2016-03-17 ENCOUNTER — Other Ambulatory Visit: Payer: BLUE CROSS/BLUE SHIELD

## 2016-03-17 VITALS — BP 100/58 | HR 68 | Wt 136.6 lb

## 2016-03-17 DIAGNOSIS — Z331 Pregnant state, incidental: Secondary | ICD-10-CM

## 2016-03-17 DIAGNOSIS — Z3403 Encounter for supervision of normal first pregnancy, third trimester: Secondary | ICD-10-CM

## 2016-03-17 DIAGNOSIS — Z131 Encounter for screening for diabetes mellitus: Secondary | ICD-10-CM

## 2016-03-17 DIAGNOSIS — Z3A27 27 weeks gestation of pregnancy: Secondary | ICD-10-CM

## 2016-03-17 DIAGNOSIS — Z1389 Encounter for screening for other disorder: Secondary | ICD-10-CM

## 2016-03-17 DIAGNOSIS — Z3402 Encounter for supervision of normal first pregnancy, second trimester: Secondary | ICD-10-CM

## 2016-03-17 LAB — POCT URINALYSIS DIPSTICK
GLUCOSE UA: NEGATIVE
Ketones, UA: NEGATIVE
NITRITE UA: NEGATIVE
Protein, UA: NEGATIVE
RBC UA: NEGATIVE

## 2016-03-17 NOTE — Progress Notes (Signed)
G1P0000  Estimated Date of Delivery: 06/16/16 LROB 679w0d  Blood pressure (!) 100/58, pulse 68, weight 136 lb 9.6 oz (62 kg), last menstrual period 09/18/2015.   Urine results:notable for  2+ leukocytes   Chief Complaint  Patient presents with  . Routine Prenatal Visit    PN2    Patient complaints: none at this time. Patient reports   good fetal movement,                           denies any bleeding , rupture of membranes,or regular contractions.   refer to the ob flow sheet for FH and FHR,                          Physical Examination: General appearance - alert, well appearing, and in no distress                                      Abdomen - FH 28                                                          -FHR 143                                                         soft, nontender, nondistended, no masses or organomegaly                                      Pelvic - examination not indicated                                            Questions were answered. Assessment: LROB G1P0000 @ 849w0d   Plan:  Continued routine obstetrical care,   F/u in 4 weeks for routine prenatal visit   By signing my name below, I, Sonum Patel, attest that this documentation has been prepared under the direction and in the presence of Tilda BurrowJohn V Mekala Winger, MD. Electronically Signed: Sonum Patel, Neurosurgeoncribe. 03/17/16. 10:03 AM.  I personally performed the services described in this documentation, which was SCRIBED in my presence. The recorded information has been reviewed and considered accurate. It has been edited as necessary during review. Tilda BurrowFERGUSON,Linken Mcglothen V, MD

## 2016-03-18 LAB — CBC
HEMOGLOBIN: 12.4 g/dL (ref 11.1–15.9)
Hematocrit: 36 % (ref 34.0–46.6)
MCH: 30.7 pg (ref 26.6–33.0)
MCHC: 34.4 g/dL (ref 31.5–35.7)
MCV: 89 fL (ref 79–97)
Platelets: 209 10*3/uL (ref 150–379)
RBC: 4.04 x10E6/uL (ref 3.77–5.28)
RDW: 14.5 % (ref 12.3–15.4)
WBC: 7 10*3/uL (ref 3.4–10.8)

## 2016-03-18 LAB — RPR: RPR: NONREACTIVE

## 2016-03-18 LAB — GLUCOSE TOLERANCE, 2 HOURS W/ 1HR
GLUCOSE, 2 HOUR: 103 mg/dL (ref 65–152)
Glucose, 1 hour: 128 mg/dL (ref 65–179)
Glucose, Fasting: 73 mg/dL (ref 65–91)

## 2016-03-18 LAB — ANTIBODY SCREEN: ANTIBODY SCREEN: NEGATIVE

## 2016-03-18 LAB — HIV ANTIBODY (ROUTINE TESTING W REFLEX): HIV Screen 4th Generation wRfx: NONREACTIVE

## 2016-03-24 ENCOUNTER — Telehealth: Payer: Self-pay | Admitting: Women's Health

## 2016-03-24 NOTE — Telephone Encounter (Signed)
Pt informed of normal GTT results.

## 2016-04-07 ENCOUNTER — Telehealth: Payer: Self-pay | Admitting: Obstetrics & Gynecology

## 2016-04-07 NOTE — Telephone Encounter (Signed)
Pt states is having lower rib pain.  Advised pt this can be normal as the baby grows and the ribs can be stretching out, advised to use Tylenol as needed and let us know if gets worse.  Pt verified understanding.

## 2016-04-07 NOTE — Telephone Encounter (Signed)
Pt called stating that she would like a call back from any nurse. Pt did not state the reason why. Please contact pt

## 2016-04-08 ENCOUNTER — Telehealth: Payer: Self-pay | Admitting: Obstetrics & Gynecology

## 2016-04-08 NOTE — Telephone Encounter (Signed)
Patient called stating she has cold symptoms, chest and nasal congestion, chills no fever. Advised patient she could take Mucinex, sudafed, Vicks vapor rub, use a humidifier. Encouraged patient to push fluids and lots of hand washing.

## 2016-04-16 ENCOUNTER — Ambulatory Visit (INDEPENDENT_AMBULATORY_CARE_PROVIDER_SITE_OTHER): Payer: BLUE CROSS/BLUE SHIELD | Admitting: Advanced Practice Midwife

## 2016-04-16 ENCOUNTER — Encounter: Payer: Self-pay | Admitting: Advanced Practice Midwife

## 2016-04-16 VITALS — BP 90/60 | HR 94 | Temp 98.2°F | Wt 142.0 lb

## 2016-04-16 DIAGNOSIS — Z1389 Encounter for screening for other disorder: Secondary | ICD-10-CM

## 2016-04-16 DIAGNOSIS — Z3403 Encounter for supervision of normal first pregnancy, third trimester: Secondary | ICD-10-CM

## 2016-04-16 DIAGNOSIS — Z331 Pregnant state, incidental: Secondary | ICD-10-CM

## 2016-04-16 DIAGNOSIS — Z3A32 32 weeks gestation of pregnancy: Secondary | ICD-10-CM

## 2016-04-16 LAB — POCT URINALYSIS DIPSTICK
Blood, UA: NEGATIVE
Glucose, UA: NEGATIVE
NITRITE UA: NEGATIVE
Protein, UA: NEGATIVE

## 2016-04-16 NOTE — Progress Notes (Signed)
G1P0000 1876w2d Estimated Date of Delivery: 06/16/16  Blood pressure 90/60, pulse 94, temperature 98.2 F (36.8 C), weight 142 lb (64.4 kg), last menstrual period 09/18/2015.   BP weight and urine results all reviewed and noted.  Please refer to the obstetrical flow sheet for the fundal height and fetal heart rate documentation:   Patient reports good fetal movement, denies any bleeding and no rupture of membranes symptoms or regular contractions. Patient c/o pain in RUQ from time to time, sometimes a "popping" feeling when she moves sometimes. No nausea/vomiting. Can't tell if it's worse w/food.  Has had URI sx for 3 days.  Throat erythemous, no exudate.   All questions were answered.  Orders Placed This Encounter  Procedures  . POCT urinalysis dipstick    Plan:  Continued routine obstetrical care, Call in 1 week for antibiotics if not improving by then ? GB pain--if sx get worse, consider US  Return in about 2 weeks (around 04/30/2016) for LROB.

## 2016-04-16 NOTE — Patient Instructions (Addendum)
Third Trimester of Pregnancy The third trimester is from week 29 through week 40 (months 7 through 9). The third trimester is a time when the unborn baby (fetus) is growing rapidly. At the end of the ninth month, the fetus is about 20 inches in length and weighs 6-10 pounds. Body changes during your third trimester Your body goes through many changes during pregnancy. The changes vary from woman to woman. During the third trimester:  Your weight will continue to increase. You can expect to gain 25-35 pounds (11-16 kg) by the end of the pregnancy.  You may begin to get stretch marks on your hips, abdomen, and breasts.  You may urinate more often because the fetus is moving lower into your pelvis and pressing on your bladder.  You may develop or continue to have heartburn. This is caused by increased hormones that slow down muscles in the digestive tract.  You may develop or continue to have constipation because increased hormones slow digestion and cause the muscles that push waste through your intestines to relax.  You may develop hemorrhoids. These are swollen veins (varicose veins) in the rectum that can itch or be painful.  You may develop swollen, bulging veins (varicose veins) in your legs.  You may have increased body aches in the pelvis, back, or thighs. This is due to weight gain and increased hormones that are relaxing your joints.  You may have changes in your hair. These can include thickening of your hair, rapid growth, and changes in texture. Some women also have hair loss during or after pregnancy, or hair that feels dry or thin. Your hair will most likely return to normal after your baby is born.  Your breasts will continue to grow and they will continue to become tender. A yellow fluid (colostrum) may leak from your breasts. This is the first milk you are producing for your baby.  Your belly button may stick out.  You may notice more swelling in your hands, face, or  ankles.  You may have increased tingling or numbness in your hands, arms, and legs. The skin on your belly may also feel numb.  You may feel short of breath because of your expanding uterus.  You may have more problems sleeping. This can be caused by the size of your belly, increased need to urinate, and an increase in your body's metabolism.  You may notice the fetus "dropping," or moving lower in your abdomen.  You may have increased vaginal discharge.  Your cervix becomes thin and soft (effaced) near your due date. What to expect at prenatal visits You will have prenatal exams every 2 weeks until week 36. Then you will have weekly prenatal exams. During a routine prenatal visit:  You will be weighed to make sure you and the fetus are growing normally.  Your blood pressure will be taken.  Your abdomen will be measured to track your baby's growth.  The fetal heartbeat will be listened to.  Any test results from the previous visit will be discussed.  You may have a cervical check near your due date to see if you have effaced. At around 36 weeks, your health care provider will check your cervix. At the same time, your health care provider will also perform a test on the secretions of the vaginal tissue. This test is to determine if a type of bacteria, Group B streptococcus, is present. Your health care provider will explain this further. Your health care provider may ask you:    What your birth plan is.  How you are feeling.  If you are feeling the baby move.  If you have had any abnormal symptoms, such as leaking fluid, bleeding, severe headaches, or abdominal cramping.  If you are using any tobacco products, including cigarettes, chewing tobacco, and electronic cigarettes.  If you have any questions. Other tests or screenings that may be performed during your third trimester include:  Blood tests that check for low iron levels (anemia).  Fetal testing to check the health,  activity level, and growth of the fetus. Testing is done if you have certain medical conditions or if there are problems during the pregnancy.  Nonstress test (NST). This test checks the health of your baby to make sure there are no signs of problems, such as the baby not getting enough oxygen. During this test, a belt is placed around your belly. The baby is made to move, and its heart rate is monitored during movement. What is false labor? False labor is a condition in which you feel small, irregular tightenings of the muscles in the womb (contractions) that eventually go away. These are called Braxton Hicks contractions. Contractions may last for hours, days, or even weeks before true labor sets in. If contractions come at regular intervals, become more frequent, increase in intensity, or become painful, you should see your health care provider. What are the signs of labor?  Abdominal cramps.  Regular contractions that start at 10 minutes apart and become stronger and more frequent with time.  Contractions that start on the top of the uterus and spread down to the lower abdomen and back.  Increased pelvic pressure and dull back pain.  A watery or bloody mucus discharge that comes from the vagina.  Leaking of amniotic fluid. This is also known as your "water breaking." It could be a slow trickle or a gush. Let your doctor know if it has a color or strange odor. If you have any of these signs, call your health care provider right away, even if it is before your due date. Follow these instructions at home: Eating and drinking  Continue to eat regular, healthy meals.  Do not eat:  Raw meat or meat spreads.  Unpasteurized milk or cheese.  Unpasteurized juice.  Store-made salad.  Refrigerated smoked seafood.  Hot dogs or deli meat, unless they are piping hot.  More than 6 ounces of albacore tuna a week.  Shark, swordfish, king mackerel, or tile fish.  Store-made salads.  Raw  sprouts, such as mung bean or alfalfa sprouts.  Take prenatal vitamins as told by your health care provider.  Take 1000 mg of calcium daily as told by your health care provider.  If you develop constipation:  Take over-the-counter or prescription medicines.  Drink enough fluid to keep your urine clear or pale yellow.  Eat foods that are high in fiber, such as fresh fruits and vegetables, whole grains, and beans.  Limit foods that are high in fat and processed sugars, such as fried and sweet foods. Activity  Exercise only as directed by your health care provider. Healthy pregnant women should aim for 2 hours and 30 minutes of moderate exercise per week. If you experience any pain or discomfort while exercising, stop.  Avoid heavy lifting.  Do not exercise in extreme heat or humidity, or at high altitudes.  Wear low-heel, comfortable shoes.  Practice good posture.  Do not travel far distances unless it is absolutely necessary and only with the approval   of your health care provider.  Wear your seat belt at all times while in a car, on a bus, or on a plane.  Take frequent breaks and rest with your legs elevated if you have leg cramps or low back pain.  Do not use hot tubs, steam rooms, or saunas.  You may continue to have sex unless your health care provider tells you otherwise. Lifestyle  Do not use any products that contain nicotine or tobacco, such as cigarettes and e-cigarettes. If you need help quitting, ask your health care provider.  Do not drink alcohol.  Do not use any medicinal herbs or unprescribed drugs. These chemicals affect the formation and growth of the baby.  If you develop varicose veins:  Wear support pantyhose or compression stockings as told by your healthcare provider.  Elevate your feet for 15 minutes, 3-4 times a day.  Wear a supportive maternity bra to help with breast tenderness. General instructions  Take over-the-counter and prescription  medicines only as told by your health care provider. There are medicines that are either safe or unsafe to take during pregnancy.  Take warm sitz baths to soothe any pain or discomfort caused by hemorrhoids. Use hemorrhoid cream or witch hazel if your health care provider approves.  Avoid cat litter boxes and soil used by cats. These carry germs that can cause birth defects in the baby. If you have a cat, ask someone to clean the litter box for you.  To prepare for the arrival of your baby:  Take prenatal classes to understand, practice, and ask questions about the labor and delivery.  Make a trial run to the hospital.  Visit the hospital and tour the maternity area.  Arrange for maternity or paternity leave through employers.  Arrange for family and friends to take care of pets while you are in the hospital.  Purchase a rear-facing car seat and make sure you know how to install it in your car.  Pack your hospital bag.  Prepare the baby's nursery. Make sure to remove all pillows and stuffed animals from the baby's crib to prevent suffocation.  Visit your dentist if you have not gone during your pregnancy. Use a soft toothbrush to brush your teeth and be gentle when you floss.  Keep all prenatal follow-up visits as told by your health care provider. This is important. Contact a health care provider if:  You are unsure if you are in labor or if your water has broken.  You become dizzy.  You have mild pelvic cramps, pelvic pressure, or nagging pain in your abdominal area.  You have lower back pain.  You have persistent nausea, vomiting, or diarrhea.  You have an unusual or bad smelling vaginal discharge.  You have pain when you urinate. Get help right away if:  You have a fever.  You are leaking fluid from your vagina.  You have spotting or bleeding from your vagina.  You have severe abdominal pain or cramping.  You have rapid weight loss or weight gain.  You have  shortness of breath with chest pain.  You notice sudden or extreme swelling of your face, hands, ankles, feet, or legs.  Your baby makes fewer than 10 movements in 2 hours.  You have severe headaches that do not go away with medicine.  You have vision changes. Summary  The third trimester is from week 29 through week 40, months 7 through 9. The third trimester is a time when the unborn baby (fetus)   is growing rapidly.  During the third trimester, your discomfort may increase as you and your baby continue to gain weight. You may have abdominal, leg, and back pain, sleeping problems, and an increased need to urinate.  During the third trimester your breasts will keep growing and they will continue to become tender. A yellow fluid (colostrum) may leak from your breasts. This is the first milk you are producing for your baby.  False labor is a condition in which you feel small, irregular tightenings of the muscles in the womb (contractions) that eventually go away. These are called Braxton Hicks contractions. Contractions may last for hours, days, or even weeks before true labor sets in.  Signs of labor can include: abdominal cramps; regular contractions that start at 10 minutes apart and become stronger and more frequent with time; watery or bloody mucus discharge that comes from the vagina; increased pelvic pressure and dull back pain; and leaking of amniotic fluid. This information is not intended to replace advice given to you by your health care provider. Make sure you discuss any questions you have with your health care provider. Document Released: 04/01/2001 Document Revised: 09/13/2015 Document Reviewed: 06/08/2012 [employer] Interactive Patient Education  2017 [employer]   Safe Medications in Pregnancy   Acne: Benzoyl Peroxide Salicylic Acid  Backache/Headache: Tylenol: 2 regular strength every 4 hours OR              2 Extra strength every 6  hours  Colds/Coughs/Allergies: Benadryl (alcohol free) 25 mg every 6 hours as needed Breath right strips Claritin Cepacol throat lozenges Chloraseptic throat spray Cold-Eeze- up to three times per day Cough drops, alcohol free Flonase (by prescription only) Guaifenesin Mucinex Robitussin DM (plain only, alcohol free) Saline nasal spray/drops Sudafed (pseudoephedrine) & Actifed ** use only after [redacted] weeks gestation and if you do not have high blood pressure Tylenol Vicks Vaporub Zinc lozenges Zyrtec   Constipation: Colace Ducolax suppositories Fleet enema Glycerin suppositories Metamucil Milk of magnesia Miralax Senokot Smooth move tea  Diarrhea: Kaopectate Imodium A-D  *NO pepto Bismol  Hemorrhoids: Anusol Anusol HC Preparation H Tucks  Indigestion: Tums Maalox Mylanta Zantac  Pepcid  Insomnia: Benadryl (alcohol free) 25mg  every 6 hours as needed Tylenol PM Unisom, no Gelcaps  Leg Cramps: Tums MagGel  Nausea/Vomiting:  Bonine Dramamine Emetrol Ginger extract Sea bands Meclizine  Nausea medication to take during pregnancy:  Unisom (doxylamine succinate 25 mg tablets) Take one tablet daily at bedtime. If symptoms are not adequately controlled, the dose can be increased to a maximum recommended dose of two tablets daily (1/2 tablet in the morning, 1/2 tablet mid-afternoon and one at bedtime). Vitamin B6 100mg  tablets. Take one tablet twice a day (up to 200 mg per day).  Skin Rashes: Aveeno products Benadryl cream or 25mg  every 6 hours as needed Calamine Lotion 1% cortisone cream  Yeast infection: Gyne-lotrimin 7 Monistat 7   **If taking multiple medications, please check labels to avoid duplicating the same active ingredients **take medication as directed on the label ** Do not exceed 4000 mg of tylenol in 24 hours **Do not take medications that contain aspirin or ibuprofen

## 2016-04-21 NOTE — L&D Delivery Note (Signed)
Delivery Note At 3:03 PM a viable female was delivered via Vaginal, Spontaneous Delivery (Presentationvertex: ;LOA  ).  APGAR: 8, 9; weight  .   Placenta status:spont ,via shultz .  Cord:  3vc with the following complications:none .  Cord pH: n/a  Anesthesia: epidural  Episiotomy: None Lacerations: 4th degree;Perineal, repair done per Dr. Alysia PennaErvin Suture Repair: 2.0 vicryl Est. Blood Loss (mL): 300  Mom to postpartum.  Baby to Couplet care / Skin to Skin.  Megan DuskyMarie Catherine Tran 06/22/2016, 3:26 PM

## 2016-04-29 ENCOUNTER — Inpatient Hospital Stay (HOSPITAL_COMMUNITY): Payer: BLUE CROSS/BLUE SHIELD

## 2016-04-29 ENCOUNTER — Inpatient Hospital Stay (HOSPITAL_COMMUNITY)
Admission: AD | Admit: 2016-04-29 | Discharge: 2016-04-29 | Disposition: A | Discharge status: Home / Self Care | Payer: BLUE CROSS/BLUE SHIELD | Source: Ambulatory Visit | Attending: Obstetrics & Gynecology | Admitting: Obstetrics & Gynecology

## 2016-04-29 ENCOUNTER — Telehealth: Payer: Self-pay | Admitting: *Deleted

## 2016-04-29 ENCOUNTER — Encounter (HOSPITAL_COMMUNITY): Payer: Self-pay

## 2016-04-29 DIAGNOSIS — Z79899 Other long term (current) drug therapy: Secondary | ICD-10-CM | POA: Diagnosis not present

## 2016-04-29 DIAGNOSIS — Z3A33 33 weeks gestation of pregnancy: Secondary | ICD-10-CM | POA: Insufficient documentation

## 2016-04-29 DIAGNOSIS — R1011 Right upper quadrant pain: Secondary | ICD-10-CM | POA: Diagnosis present

## 2016-04-29 DIAGNOSIS — O26893 Other specified pregnancy related conditions, third trimester: Secondary | ICD-10-CM | POA: Diagnosis not present

## 2016-04-29 DIAGNOSIS — Z87891 Personal history of nicotine dependence: Secondary | ICD-10-CM | POA: Diagnosis not present

## 2016-04-29 LAB — URINALYSIS, ROUTINE W REFLEX MICROSCOPIC
Bilirubin Urine: NEGATIVE
Glucose, UA: NEGATIVE mg/dL
Hgb urine dipstick: NEGATIVE
Ketones, ur: NEGATIVE mg/dL
Nitrite: NEGATIVE
PH: 6 (ref 5.0–8.0)
Protein, ur: NEGATIVE mg/dL
Specific Gravity, Urine: 1.019 (ref 1.005–1.030)

## 2016-04-29 LAB — COMPREHENSIVE METABOLIC PANEL
ALK PHOS: 45 U/L (ref 38–126)
ALT: 13 U/L — AB (ref 14–54)
ANION GAP: 8 (ref 5–15)
AST: 18 U/L (ref 15–41)
Albumin: 3.2 g/dL — ABNORMAL LOW (ref 3.5–5.0)
BUN: 5 mg/dL — ABNORMAL LOW (ref 6–20)
CALCIUM: 8.6 mg/dL — AB (ref 8.9–10.3)
CO2: 23 mmol/L (ref 22–32)
CREATININE: 0.34 mg/dL — AB (ref 0.44–1.00)
Chloride: 104 mmol/L (ref 101–111)
Glucose, Bld: 86 mg/dL (ref 65–99)
Potassium: 3.8 mmol/L (ref 3.5–5.1)
SODIUM: 135 mmol/L (ref 135–145)
Total Bilirubin: 0.3 mg/dL (ref 0.3–1.2)
Total Protein: 6.4 g/dL — ABNORMAL LOW (ref 6.5–8.1)

## 2016-04-29 LAB — CBC
HCT: 32.9 % — ABNORMAL LOW (ref 36.0–46.0)
HEMOGLOBIN: 11.5 g/dL — AB (ref 12.0–15.0)
MCH: 30.7 pg (ref 26.0–34.0)
MCHC: 35 g/dL (ref 30.0–36.0)
MCV: 87.7 fL (ref 78.0–100.0)
PLATELETS: 172 10*3/uL (ref 150–400)
RBC: 3.75 MIL/uL — ABNORMAL LOW (ref 3.87–5.11)
RDW: 13.6 % (ref 11.5–15.5)
WBC: 9.3 10*3/uL (ref 4.0–10.5)

## 2016-04-29 LAB — AMYLASE: AMYLASE: 89 U/L (ref 28–100)

## 2016-04-29 LAB — LIPASE, BLOOD: LIPASE: 34 U/L (ref 11–51)

## 2016-04-29 NOTE — Telephone Encounter (Signed)
Pt states she has a bad pain in rt side, Drenda FreezeFran told her at last visit it was her gallbladder, nothing helps the pain other than laying down, pt is wanting to know what she can take because it is difficult to work with the pain.  Pt is scheduled for OV tomorrow.  Advised pt can take Tylenol or Extra Strength Tylenol, push fluids and rest when can, keep appointment tomorrow and discuss with provider, [facility] is [facility]  Pt verbalized understanding.

## 2016-04-29 NOTE — MAU Provider Note (Signed)
History     CSN: [id number]  Arrival date and time: 04/29/16 1337      Chief Complaint  Patient presents with  . Abdominal Pain    right rib cage pain   Megan Tran is a 26 y.o. G1P0 at [redacted]w[redacted]d presenting with right upper quadrant pain present intermittently over the past month. Onset of today's sharp pain was about 1 hour after eating breakfast and unremitting since then. States it is worse if she bends over or reaches up. She has described symptoms to primary care providers who are considering gallbladder disease and were planning workup if symptoms persist. She's been advised to avoid fatty foods.   Abdominal Pain  This is a recurrent problem. The current episode started 1 to 4 weeks ago. The onset quality is gradual. The problem occurs intermittently (constant today since after breakfast; waxes and wanes). The problem has been waxing and waning. The pain is located in the RUQ. The pain is at a severity of 7/10. The pain is moderate. The quality of the pain is sharp. The abdominal pain does not radiate. Pertinent negatives include no anorexia, dysuria, fever, frequency, nausea or vomiting. The pain is aggravated by movement and eating (often worse about 1 hr postprandial). The pain is relieved by recumbency. There is no history of abdominal surgery, gallstones, GERD or pancreatitis.    OB History  Gravida Para Term Preterm AB Living  1 0 0 0 0 0  SAB TAB Ectopic Multiple Live Births  0 0 0 0      # Outcome Date GA Lbr Len/2nd Weight Sex Delivery Anes PTL Lv  1 Current                Past Medical History:  Diagnosis Date  . Contraceptive management 03/03/2014  . Medical history non-contributory   . Pregnant 10/19/2015  . Vaginal burning 11/16/2015  . Vaginal discharge during pregnancy in first trimester 11/16/2015  . Yeast infection 11/02/2012    Past Surgical History:  Procedure Laterality Date  . MOUTH SURGERY  Oct. 2012    Family History  Problem Relation Age of  Onset  . Hypertension Maternal Grandmother   . Hypertension Maternal Grandfather   . Heart disease Maternal Grandfather     Social History  Substance Use Topics  . Smoking status: Former Smoker    Types: Cigarettes  . Smokeless tobacco: Never Used  . Alcohol use No     Comment: occassional; not now    Allergies:  Allergies  Allergen Reactions  . Cefdinir Hives and Other (See Comments)    Tight in throat    Prescriptions Prior to Admission  Medication Sig Dispense Refill Last Dose  . acetaminophen (TYLENOL) 500 MG tablet Take 1,000 mg by mouth as needed for moderate pain.    04/29/2016 at Unknown time  . omeprazole (PRILOSEC OTC) 20 MG tablet Take 1 tablet (20 mg total) by mouth daily.   04/29/2016 at Unknown time  . prenatal vitamin w/FE, FA (PRENATAL 1 + 1) 27-1 MG TABS tablet Take 1 tablet by mouth daily at 12 noon. 30 each 12 04/28/2016 at Unknown time    Review of Systems  Constitutional: Negative for activity change, appetite change and fever.  Respiratory: Negative for chest tightness, shortness of breath and wheezing.   Cardiovascular: Negative for chest pain.  Gastrointestinal: Positive for abdominal pain. Negative for abdominal distention, anorexia, nausea and vomiting.  Genitourinary: Negative for dyspareunia, dysuria, frequency and vaginal bleeding.  Musculoskeletal: Negative  for back pain.   Physical Exam   Blood pressure 105/67, pulse 99, temperature 98.2 F (36.8 C), temperature source Oral, resp. rate 18, last menstrual period 09/18/2015, SpO2 99 %.  EFM:  Physical Exam  Nursing note and vitals reviewed. Constitutional: She is oriented to person, place, and time. She appears well-developed and well-nourished. No distress.  HENT:  Head: Normocephalic.  Eyes: Pupils are equal, round, and reactive to light.  Neck: Normal range of motion.  Cardiovascular: Normal rate, regular rhythm and normal heart sounds.   Respiratory: Effort normal and breath sounds normal.   GI: Soft. She exhibits no distension and no mass. There is tenderness. There is no guarding.  Mildly TTP left costal margin. Neg Murphy's sign No organomegaly noted  Genitourinary:  Genitourinary Comments: SVE: posterior, closed, long, high  Musculoskeletal: Normal range of motion. She exhibits no edema.  Neurological: She is alert and oriented to person, place, and time.  Skin: Skin is warm and dry.  Psychiatric: She has a normal mood and affect. Her behavior is normal.    MAU Course  Procedures  EFM: Baseline 145, moderate variability, accelerations present, no decelerations Toco: no UCs  Results for orders placed or performed during the hospital encounter of 04/29/16 (from the past 24 hour(s))  Urinalysis, Routine w reflex microscopic     Status: Abnormal   Collection Time: 04/29/16  1:50 PM  Result Value Ref Range   Color, Urine YELLOW YELLOW   APPearance CLOUDY (A) CLEAR   Specific Gravity, Urine 1.019 1.005 - 1.030   pH 6.0 5.0 - 8.0   Glucose, UA NEGATIVE NEGATIVE mg/dL   Hgb urine dipstick NEGATIVE NEGATIVE   Bilirubin Urine NEGATIVE NEGATIVE   Ketones, ur NEGATIVE NEGATIVE mg/dL   Protein, ur NEGATIVE NEGATIVE mg/dL   Nitrite NEGATIVE NEGATIVE   Leukocytes, UA LARGE (A) NEGATIVE   RBC / HPF 0-5 0 - 5 RBC/hpf   WBC, UA TOO NUMEROUS TO COUNT 0 - 5 WBC/hpf   Bacteria, UA RARE (A) NONE SEEN   Squamous Epithelial / LPF TOO NUMEROUS TO COUNT (A) NONE SEEN   Mucous PRESENT   CBC     Status: Abnormal   Collection Time: 04/29/16  2:34 PM  Result Value Ref Range   WBC 9.3 4.0 - 10.5 K/uL   RBC 3.75 (L) 3.87 - 5.11 MIL/uL   Hemoglobin 11.5 (L) 12.0 - 15.0 g/dL   HCT 82.932.9 (L) 56.236.0 - 13.046.0 %   MCV 87.7 78.0 - 100.0 fL   MCH 30.7 26.0 - 34.0 pg   MCHC 35.0 30.0 - 36.0 g/dL   RDW 86.513.6 78.411.5 - 69.615.5 %   Platelets 172 150 - 400 K/uL  Comprehensive metabolic panel     Status: Abnormal   Collection Time: 04/29/16  2:34 PM  Result Value Ref Range   Sodium 135 135 - 145 mmol/L    Potassium 3.8 3.5 - 5.1 mmol/L   Chloride 104 101 - 111 mmol/L   CO2 23 22 - 32 mmol/L   Glucose, Bld 86 65 - 99 mg/dL   BUN 5 (L) 6 - 20 mg/dL   Creatinine, Ser 2.950.34 (L) 0.44 - 1.00 mg/dL   Calcium 8.6 (L) 8.9 - 10.3 mg/dL   Total Protein 6.4 (L) 6.5 - 8.1 g/dL   Albumin 3.2 (L) 3.5 - 5.0 g/dL   AST 18 15 - 41 U/L   ALT 13 (L) 14 - 54 U/L   Alkaline Phosphatase 45 38 - 126 U/L  Total Bilirubin 0.3 0.3 - 1.2 mg/dL   GFR calc non Af Amer >60 >60 mL/min   GFR calc Af Amer >60 >60 mL/min   Anion gap 8 5 - 15  Amylase     Status: None   Collection Time: 04/29/16  2:34 PM  Result Value Ref Range   Amylase 89 28 - 100 U/L  Lipase, blood     Status: None   Collection Time: 04/29/16  2:34 PM  Result Value Ref Range   Lipase 34 11 - 51 U/L   Urine culture sent   US Abdomen Limited Ruq  Result Date: 04/29/2016 CLINICAL DATA:  Right-sided abdominal and rib cage pain for over a month, the patient is [redacted] weeks pregnant EXAM: US ABDOMEN LIMITED - RIGHT UPPER QUADRANT COMPARISON:  None. FINDINGS: Gallbladder: The gallbladder is visualized and no gallstones are noted. There is no pain over the gallbladder with compression. Common bile duct: Diameter: The common bile duct is normal measuring 2.8 mm in diameter. Liver: The liver has a normal echogenic pattern. No focal hepatic abnormality is seen. IMPRESSION: Negative limited ultrasound of the right upper quadrant. Electronically Signed   By: Dwyane Dee M.D.   On: 04/29/2016 17:15     Assessment and Plan  G1 at [redacted]w[redacted]d 1. Abdominal pain, RUQ (right upper quadrant)   FWB by EFM  Discharge home with reassurance re: late gestational discomforts May use acetaminophen for pain, maximum 4 Gm/24 hr  Allergies as of 04/29/2016      Reactions   Cefdinir Hives, Other (See Comments)   Tight in throat      Medication List    TAKE these medications   acetaminophen 500 MG tablet Commonly known as:  TYLENOL Take 1,000 mg by mouth as needed for  moderate pain.   omeprazole 20 MG tablet Commonly known as:  PRILOSEC OTC Take 1 tablet (20 mg total) by mouth daily.   prenatal vitamin w/FE, FA 27-1 MG Tabs tablet Take 1 tablet by mouth daily at 12 noon.      Follow-up Information    [facility]. Call on 04/30/2016.   Specialty:  Obstetrics and Gynecology Why:  Call to schedule appointment for later since evaluated today Contact information: [address] [place] [address] [phone]         Cyndia Degraff 04/29/2016, 2:13 PM

## 2016-04-29 NOTE — Progress Notes (Addendum)
G1P0 @ [redacted] wksga. Presents to triage for abdominal right side rib cage area pain for over a month. Pt states provider made aware. Pt was informed either gall bladder issue or position of the baby in utero. States pain is tolerable and subsides when laying down. Denies LOF or bleeding. +FM. EFM applied. VSS. See flow sheet for details.   Hx: placenta in the front  1417: Provider at bs.

## 2016-04-29 NOTE — MAU Note (Signed)
Pt presents to MAU with complaints of pain on her right upper side for a month. Pt states that her physician told her it may be her gallbladder. Denies any vaginal bleeding or LOF

## 2016-04-30 ENCOUNTER — Ambulatory Visit (INDEPENDENT_AMBULATORY_CARE_PROVIDER_SITE_OTHER): Payer: BLUE CROSS/BLUE SHIELD | Admitting: Women's Health

## 2016-04-30 ENCOUNTER — Encounter: Payer: Self-pay | Admitting: Women's Health

## 2016-04-30 VITALS — BP 102/70 | HR 102 | Wt 145.0 lb

## 2016-04-30 DIAGNOSIS — O26893 Other specified pregnancy related conditions, third trimester: Secondary | ICD-10-CM

## 2016-04-30 DIAGNOSIS — Z1389 Encounter for screening for other disorder: Secondary | ICD-10-CM

## 2016-04-30 DIAGNOSIS — Z331 Pregnant state, incidental: Secondary | ICD-10-CM

## 2016-04-30 DIAGNOSIS — Z3A33 33 weeks gestation of pregnancy: Secondary | ICD-10-CM

## 2016-04-30 DIAGNOSIS — Z3403 Encounter for supervision of normal first pregnancy, third trimester: Secondary | ICD-10-CM

## 2016-04-30 LAB — POCT URINALYSIS DIPSTICK
Blood, UA: NEGATIVE
Glucose, UA: NEGATIVE
Ketones, UA: NEGATIVE
LEUKOCYTES UA: NEGATIVE
NITRITE UA: NEGATIVE
PROTEIN UA: NEGATIVE

## 2016-04-30 LAB — URINE CULTURE
Culture: NO GROWTH
Special Requests: NORMAL

## 2016-04-30 MED ORDER — CYCLOBENZAPRINE HCL 10 MG PO TABS: 10.0000 mg | ORAL_TABLET | Freq: Three times a day (TID) | ORAL | 0 refills | Status: DC | PRN

## 2016-04-30 NOTE — Patient Instructions (Signed)
Call the office [phone]) or go to [facility] if:  You begin to have strong, frequent contractions  Your water breaks.  Sometimes it is a big gush of fluid, sometimes it is just a trickle that keeps getting your panties wet or running down your legs  You have vaginal bleeding.  It is normal to have a small amount of spotting if your cervix was checked.   You don't feel your baby moving like normal.  If you don't, get you something to eat and drink and lay down and focus on feeling your baby move.  You should feel at least 10 movements in 2 hours.  If you don't, you should call the office or go to [facility].   Tdap Vaccine  It is recommended that you get the Tdap vaccine during the third trimester of EACH pregnancy to help protect your baby from getting pertussis (whooping cough)  27-36 weeks is the BEST time to do this so that you can pass the protection on to your baby. During pregnancy is better than after pregnancy, but if you are unable to get it during pregnancy it will be offered at the hospital.   You can get this vaccine at the health department or your family doctor  Everyone who will be around your baby should also be up-to-date on their vaccines. Adults (who are not pregnant) only need 1 dose of Tdap during adulthood.        Preterm Labor and Birth Information The normal length of a pregnancy is 39-41 weeks. Preterm labor is when labor starts before 37 completed weeks of pregnancy. What are the risk factors for preterm labor? Preterm labor is more likely to occur in women who:  Have certain infections during pregnancy such as a bladder infection, sexually transmitted infection, or infection inside the uterus (chorioamnionitis).  Have a shorter-than-normal cervix.  Have gone into preterm labor before.  Have had surgery on their cervix.  Are younger than age 31 or older than age 15.  Are African American.  Are pregnant with twins or multiple babies  (multiple gestation).  Take street drugs or smoke while pregnant.  Do not gain enough weight while pregnant.  Became pregnant shortly after having been pregnant. What are the symptoms of preterm labor? Symptoms of preterm labor include:  Cramps similar to those that can happen during a menstrual period. The cramps may happen with diarrhea.  Pain in the abdomen or lower back.  Regular uterine contractions that may feel like tightening of the abdomen.  A feeling of increased pressure in the pelvis.  Increased watery or bloody mucus discharge from the vagina.  Water breaking (ruptured amniotic sac). Why is it important to recognize signs of preterm labor? It is important to recognize signs of preterm labor because babies who are born prematurely may not be fully developed. This can put them at an increased risk for:  Long-term (chronic) heart and lung problems.  Difficulty immediately after birth with regulating body systems, including blood sugar, body temperature, heart rate, and breathing rate.  Bleeding in the brain.  Cerebral palsy.  Learning difficulties.  Death. These risks are highest for babies who are born before 64 weeks of pregnancy. How is preterm labor treated? Treatment depends on the length of your pregnancy, your condition, and the health of your baby. It may involve:  Having a stitch (suture) placed in your cervix to prevent your cervix from opening too early (cerclage).  Taking or being given medicines, such  as:  Hormone medicines. These may be given early in pregnancy to help support the pregnancy.  Medicine to stop contractions.  Medicines to help mature the baby's lungs. These may be prescribed if the risk of delivery is high.  Medicines to prevent your baby from developing cerebral palsy. If the labor happens before 34 weeks of pregnancy, you may need to stay in the hospital. What should I do if I think I am in preterm labor? If you think that  you are going into preterm labor, call your health care provider right away. How can I prevent preterm labor in future pregnancies? To increase your chance of having a full-term pregnancy:  Do not use any tobacco products, such as cigarettes, chewing tobacco, and e-cigarettes. If you need help quitting, ask your health care provider.  Do not use street drugs or medicines that have not been prescribed to you during your pregnancy.  Talk with your health care provider before taking any herbal supplements, even if you have been taking them regularly.  Make sure you gain a healthy amount of weight during your pregnancy.  Watch for infection. If you think that you might have an infection, get it checked right away.  Make sure to tell your health care provider if you have gone into preterm labor before. This information is not intended to replace advice given to you by your health care provider. Make sure you discuss any questions you have with your health care provider. Document Released: 06/28/2003 Document Revised: 09/18/2015 Document Reviewed: 08/29/2015 Elsevier Interactive Patient Education  2017 Elsevier Inc.  Low-Fat Diet for Pancreatitis or Gallbladder Conditions A low-fat diet can be helpful if you have pancreatitis or a gallbladder condition. With these conditions, your pancreas and gallbladder have trouble digesting fats. A healthy eating plan with less fat will help rest your pancreas and gallbladder and reduce your symptoms. What do I need to know about this diet? Eat a low-fat diet. Reduce your fat intake to less than 20-30% of your total daily calories. This is less than 50-60 g of fat per day. Remember that you need some fat in your diet. Ask your dietician what your daily goal should be. Choose nonfat and low-fat healthy foods. Look for the words "nonfat," "low fat," or "fat free." As a guide, look on the label and choose foods with less than 3 g of fat per serving. Eat only  one serving. Avoid alcohol. Do not smoke. If you need help quitting, talk with your health care provider. Eat small frequent meals instead of three large heavy meals. What foods can I eat? Grains  Include healthy grains and starches such as potatoes, wheat bread, fiber-rich cereal, and brown rice. Choose whole grain options whenever possible. In adults, whole grains should account for 45-65% of your daily calories. Fruits and Vegetables  Eat plenty of fruits and vegetables. Fresh fruits and vegetables add fiber to your diet. Meats and Other Protein Sources  Eat lean meat such as chicken and pork. Trim any fat off of meat before cooking it. Eggs, fish, and beans are other sources of protein. In adults, these foods should account for 10-35% of your daily calories. Dairy  Choose low-fat milk and dairy options. Dairy includes fat and protein, as well as calcium. Fats and Oils  Limit high-fat foods such as fried foods, sweets, baked goods, sugary drinks. Other  Creamy sauces and condiments, such as mayonnaise, can add extra fat. Think about whether or not you need to use  them, or use smaller amounts or low fat options. What foods are not recommended? High fat foods, such as: [employer]. Ice cream. [place] toast. Sweet rolls. Pizza. Cheese bread. Foods covered with batter, butter, creamy sauces, or cheese. Fried foods. Sugary drinks and desserts. Foods that cause gas or bloating This information is not intended to replace advice given to you by your health care provider. Make sure you discuss any questions you have with your health care provider. Document Released: 04/12/2013 Document Revised: 09/13/2015 Document Reviewed: 03/21/2013 Elsevier Interactive Patient Education  2017 [employer].

## 2016-04-30 NOTE — Progress Notes (Addendum)
Low-risk OB appointment G1P0000 8649w2d Estimated Date of Delivery: 06/16/16 BP 102/70   Pulse (!) 102   Wt 145 lb (65.8 kg)   LMP 09/18/2015 (Approximate)   BMI 24.89 kg/m   BP, weight, and urine reviewed.  Refer to obstetrical flow sheet for FH & FHR.  Reports good fm.  Denies regular uc's, lof, vb, or uti s/s. RUQ pain radiating to back for about 1 month. No relation to food. No n/v. Went to [facility] yesterday, RUQ u/s neg, CMP, amylase/lipase normal. States it hurts when she bends over or lifts up, feels like something pulling/tearing. No pain to palpation. Can try flexeril, rx sent.  Reviewed ptl s/s, fkc. Recommended Tdap at HD/PCP per CDC guidelines.  Plan:  Continue routine obstetrical care  F/U in 2wks for OB appointment

## 2016-05-14 ENCOUNTER — Ambulatory Visit (INDEPENDENT_AMBULATORY_CARE_PROVIDER_SITE_OTHER): Payer: BLUE CROSS/BLUE SHIELD | Admitting: Advanced Practice Midwife

## 2016-05-14 ENCOUNTER — Encounter: Payer: Self-pay | Admitting: Advanced Practice Midwife

## 2016-05-14 VITALS — BP 80/70 | HR 74 | Wt 148.0 lb

## 2016-05-14 DIAGNOSIS — Z3403 Encounter for supervision of normal first pregnancy, third trimester: Secondary | ICD-10-CM

## 2016-05-14 DIAGNOSIS — O23593 Infection of other part of genital tract in pregnancy, third trimester: Secondary | ICD-10-CM

## 2016-05-14 DIAGNOSIS — Z3A36 36 weeks gestation of pregnancy: Secondary | ICD-10-CM

## 2016-05-14 NOTE — Patient Instructions (Signed)

## 2016-05-14 NOTE — Progress Notes (Signed)
G1P0000 378w2d Estimated Date of Delivery: 06/16/16  Last menstrual period 09/18/2015.   BP weight and urine results all reviewed and noted.  Please refer to the obstetrical flow sheet for the fundal height and fetal heart rate documentation:  Patient reports good fetal movement, denies any bleeding and no rupture of membranes symptoms or regular contractions. Patient thinks she has another yeast infection .  SSE: yes.  Says has had at least 6 this pregnancy All questions were answered.  No orders of the defined types were placed in this encounter.   Plan:  Continued routine obstetrical care, 7 day OTC treatment; try using once a week preventative  Return in about 1 week (around 05/21/2016) for LROB.

## 2016-05-21 ENCOUNTER — Ambulatory Visit (INDEPENDENT_AMBULATORY_CARE_PROVIDER_SITE_OTHER): Payer: BLUE CROSS/BLUE SHIELD | Admitting: Advanced Practice Midwife

## 2016-05-21 VITALS — BP 98/60 | HR 112 | Wt 148.0 lb

## 2016-05-21 DIAGNOSIS — Z331 Pregnant state, incidental: Secondary | ICD-10-CM

## 2016-05-21 DIAGNOSIS — Z3403 Encounter for supervision of normal first pregnancy, third trimester: Secondary | ICD-10-CM

## 2016-05-21 DIAGNOSIS — Z1389 Encounter for screening for other disorder: Secondary | ICD-10-CM

## 2016-05-21 DIAGNOSIS — Z3A36 36 weeks gestation of pregnancy: Secondary | ICD-10-CM

## 2016-05-21 LAB — POCT URINALYSIS DIPSTICK
GLUCOSE UA: NEGATIVE
Ketones, UA: NEGATIVE
Leukocytes, UA: NEGATIVE
Nitrite, UA: NEGATIVE
RBC UA: NEGATIVE

## 2016-05-21 NOTE — Progress Notes (Signed)
G1P0000 7291w2d Estimated Date of Delivery: 06/16/16  Blood pressure 98/60, pulse (!) 112, weight 148 lb (67.1 kg), last menstrual period 09/18/2015.   BP weight and urine results all reviewed and noted.  Please refer to the obstetrical flow sheet for the fundal height and fetal heart rate documentation:  Patient reports good fetal movement, denies any bleeding and no rupture of membranes symptoms or regular contractions. Patient is without complaints. All questions were answered.  Orders Placed This Encounter  Procedures  . POCT urinalysis dipstick    Plan:  Continued routine obstetrical care,   Return in about 1 week (around 05/28/2016) for LROB.

## 2016-05-30 ENCOUNTER — Ambulatory Visit (INDEPENDENT_AMBULATORY_CARE_PROVIDER_SITE_OTHER): Payer: BLUE CROSS/BLUE SHIELD | Admitting: Women's Health

## 2016-05-30 ENCOUNTER — Encounter: Payer: Self-pay | Admitting: Women's Health

## 2016-05-30 VITALS — BP 102/56 | HR 96 | Wt 151.0 lb

## 2016-05-30 DIAGNOSIS — O26843 Uterine size-date discrepancy, third trimester: Secondary | ICD-10-CM

## 2016-05-30 DIAGNOSIS — Z331 Pregnant state, incidental: Secondary | ICD-10-CM

## 2016-05-30 DIAGNOSIS — Z3403 Encounter for supervision of normal first pregnancy, third trimester: Secondary | ICD-10-CM

## 2016-05-30 DIAGNOSIS — Z3A38 38 weeks gestation of pregnancy: Secondary | ICD-10-CM

## 2016-05-30 DIAGNOSIS — Z1389 Encounter for screening for other disorder: Secondary | ICD-10-CM

## 2016-05-30 LAB — POCT URINALYSIS DIPSTICK
Glucose, UA: NEGATIVE
Ketones, UA: NEGATIVE
NITRITE UA: NEGATIVE
Protein, UA: NEGATIVE
RBC UA: NEGATIVE

## 2016-05-30 NOTE — Progress Notes (Signed)
Low-risk OB appointment G1P0000 4671w4d Estimated Date of Delivery: 06/16/16 BP (!) 102/56   Pulse 96   Wt 151 lb (68.5 kg)   LMP 09/18/2015 (Approximate)   BMI 25.92 kg/m   BP, weight, and urine reviewed.  Refer to obstetrical flow sheet for FH & FHR.  Reports good fm.  Denies regular uc's, lof, vb, or uti s/s. No complaints. GBS, gc/ct collected FH s<d, has been measuring normal, baby almost appears transverse, but is vtx by leopold's SVE per request: cl/th/-2, vtx by informal u/s Reviewed labor s/s, fkc. Plan:  Continue routine obstetrical care  F/U in asap for efw/afi u/s for s<d (no visit), then 1wk for OB appointment

## 2016-05-30 NOTE — Patient Instructions (Signed)
Call the office ([phone]) or go to [facility] if:  You begin to have strong, frequent contractions  Your water breaks.  Sometimes it is a big gush of fluid, sometimes it is just a trickle that keeps getting your panties wet or running down your legs  You have vaginal bleeding.  It is normal to have a small amount of spotting if your cervix was checked.   You don't feel your baby moving like normal.  If you don't, get you something to eat and drink and lay down and focus on feeling your baby move.  You should feel at least 10 movements in 2 hours.  If you don't, you should call the office or go to [facility].     Braxton Hicks Contractions Contractions of the uterus can occur throughout pregnancy. Contractions are not always a sign that you are in labor.  WHAT ARE BRAXTON HICKS CONTRACTIONS?  Contractions that occur before labor are called Braxton Hicks contractions, or false labor. Toward the end of pregnancy (32-34 weeks), these contractions can develop more often and may become more forceful. This is not true labor because these contractions do not result in opening (dilatation) and thinning of the cervix. They are sometimes difficult to tell apart from true labor because these contractions can be forceful and people have different pain tolerances. You should not feel embarrassed if you go to the hospital with false labor. Sometimes, the only way to tell if you are in true labor is for your health care provider to look for changes in the cervix. If there are no prenatal problems or other health problems associated with the pregnancy, it is completely safe to be sent home with false labor and await the onset of true labor. HOW CAN YOU TELL THE DIFFERENCE BETWEEN TRUE AND FALSE LABOR? False Labor   The contractions of false labor are usually shorter and not as hard as those of true labor.   The contractions are usually irregular.   The contractions are often felt in the front of  the lower abdomen and in the groin.   The contractions may go away when you walk around or change positions while lying down.   The contractions get weaker and are shorter lasting as time goes on.   The contractions do not usually become progressively stronger, regular, and closer together as with true labor.  True Labor   Contractions in true labor last 30-70 seconds, become very regular, usually become more intense, and increase in frequency.   The contractions do not go away with walking.   The discomfort is usually felt in the top of the uterus and spreads to the lower abdomen and low back.   True labor can be determined by your health care provider with an exam. This will show that the cervix is dilating and getting thinner.  WHAT TO REMEMBER  Keep up with your usual exercises and follow other instructions given by your health care provider.   Take medicines as directed by your health care provider.   Keep your regular prenatal appointments.   Eat and drink lightly if you think you are going into labor.   If Braxton Hicks contractions are making you uncomfortable:   Change your position from lying down or resting to walking, or from walking to resting.   Sit and rest in a tub of warm water.   Drink 2-3 glasses of water. Dehydration may cause these contractions.   Do slow and deep breathing several   times an hour.  WHEN SHOULD I SEEK IMMEDIATE MEDICAL CARE? Seek immediate medical care if:  Your contractions become stronger, more regular, and closer together.   You have fluid leaking or gushing from your vagina.   You have a fever.   You pass blood-tinged mucus.   You have vaginal bleeding.   You have continuous abdominal pain.   You have low back pain that you never had before.   You feel your baby's head pushing down and causing pelvic pressure.   Your baby is not moving as much as it used to.  This information is not intended to  replace advice given to you by your health care provider. Make sure you discuss any questions you have with your health care provider. Document Released: 04/07/2005 Document Revised: 07/30/2015 Document Reviewed: 01/17/2013 Elsevier Interactive Patient Education  2017 Elsevier Inc.  

## 2016-06-01 LAB — STREP GP B NAA: STREP GROUP B AG: NEGATIVE

## 2016-06-02 ENCOUNTER — Ambulatory Visit (INDEPENDENT_AMBULATORY_CARE_PROVIDER_SITE_OTHER): Payer: BLUE CROSS/BLUE SHIELD

## 2016-06-02 DIAGNOSIS — Z3A38 38 weeks gestation of pregnancy: Secondary | ICD-10-CM | POA: Diagnosis not present

## 2016-06-02 DIAGNOSIS — Z3403 Encounter for supervision of normal first pregnancy, third trimester: Secondary | ICD-10-CM

## 2016-06-02 DIAGNOSIS — O26843 Uterine size-date discrepancy, third trimester: Secondary | ICD-10-CM | POA: Diagnosis not present

## 2016-06-02 NOTE — Progress Notes (Signed)
US 38 wks,cephalic,ant pl gr 1,normal ov's bilat,afi 14.2 cm,fhr 130 bpm,efw 3231 g 50 %

## 2016-06-03 LAB — GC/CHLAMYDIA PROBE AMP
Chlamydia trachomatis, NAA: NEGATIVE
Neisseria gonorrhoeae by PCR: NEGATIVE

## 2016-06-06 ENCOUNTER — Ambulatory Visit (INDEPENDENT_AMBULATORY_CARE_PROVIDER_SITE_OTHER): Payer: BLUE CROSS/BLUE SHIELD | Admitting: Women's Health

## 2016-06-06 VITALS — BP 104/62 | HR 78 | Wt 150.8 lb

## 2016-06-06 DIAGNOSIS — O26813 Pregnancy related exhaustion and fatigue, third trimester: Secondary | ICD-10-CM

## 2016-06-06 DIAGNOSIS — Z3A38 38 weeks gestation of pregnancy: Secondary | ICD-10-CM

## 2016-06-06 DIAGNOSIS — Z1389 Encounter for screening for other disorder: Secondary | ICD-10-CM

## 2016-06-06 DIAGNOSIS — Z331 Pregnant state, incidental: Secondary | ICD-10-CM

## 2016-06-06 DIAGNOSIS — Z3403 Encounter for supervision of normal first pregnancy, third trimester: Secondary | ICD-10-CM

## 2016-06-06 LAB — POCT URINALYSIS DIPSTICK
GLUCOSE UA: NEGATIVE
Ketones, UA: NEGATIVE
NITRITE UA: NEGATIVE
Protein, UA: NEGATIVE

## 2016-06-06 MED ORDER — TERCONAZOLE 0.4 % VA CREA: 1.0000 | TOPICAL_CREAM | Freq: Every day | VAGINAL | 0 refills | Status: DC

## 2016-06-06 NOTE — Patient Instructions (Signed)
Call the office ([phone]) or go to [facility] if:  You begin to have strong, frequent contractions  Your water breaks.  Sometimes it is a big gush of fluid, sometimes it is just a trickle that keeps getting your panties wet or running down your legs  You have vaginal bleeding.  It is normal to have a small amount of spotting if your cervix was checked.   You don't feel your baby moving like normal.  If you don't, get you something to eat and drink and lay down and focus on feeling your baby move.  You should feel at least 10 movements in 2 hours.  If you don't, you should call the office or go to [facility].     Braxton Hicks Contractions Contractions of the uterus can occur throughout pregnancy. Contractions are not always a sign that you are in labor.  WHAT ARE BRAXTON HICKS CONTRACTIONS?  Contractions that occur before labor are called Braxton Hicks contractions, or false labor. Toward the end of pregnancy (32-34 weeks), these contractions can develop more often and may become more forceful. This is not true labor because these contractions do not result in opening (dilatation) and thinning of the cervix. They are sometimes difficult to tell apart from true labor because these contractions can be forceful and people have different pain tolerances. You should not feel embarrassed if you go to the hospital with false labor. Sometimes, the only way to tell if you are in true labor is for your health care provider to look for changes in the cervix. If there are no prenatal problems or other health problems associated with the pregnancy, it is completely safe to be sent home with false labor and await the onset of true labor. HOW CAN YOU TELL THE DIFFERENCE BETWEEN TRUE AND FALSE LABOR? False Labor   The contractions of false labor are usually shorter and not as hard as those of true labor.   The contractions are usually irregular.   The contractions are often felt in the front of  the lower abdomen and in the groin.   The contractions may go away when you walk around or change positions while lying down.   The contractions get weaker and are shorter lasting as time goes on.   The contractions do not usually become progressively stronger, regular, and closer together as with true labor.  True Labor   Contractions in true labor last 30-70 seconds, become very regular, usually become more intense, and increase in frequency.   The contractions do not go away with walking.   The discomfort is usually felt in the top of the uterus and spreads to the lower abdomen and low back.   True labor can be determined by your health care provider with an exam. This will show that the cervix is dilating and getting thinner.  WHAT TO REMEMBER  Keep up with your usual exercises and follow other instructions given by your health care provider.   Take medicines as directed by your health care provider.   Keep your regular prenatal appointments.   Eat and drink lightly if you think you are going into labor.   If Braxton Hicks contractions are making you uncomfortable:   Change your position from lying down or resting to walking, or from walking to resting.   Sit and rest in a tub of warm water.   Drink 2-3 glasses of water. Dehydration may cause these contractions.   Do slow and deep breathing several   times an hour.  WHEN SHOULD I SEEK IMMEDIATE MEDICAL CARE? Seek immediate medical care if:  Your contractions become stronger, more regular, and closer together.   You have fluid leaking or gushing from your vagina.   You have a fever.   You pass blood-tinged mucus.   You have vaginal bleeding.   You have continuous abdominal pain.   You have low back pain that you never had before.   You feel your baby's head pushing down and causing pelvic pressure.   Your baby is not moving as much as it used to.  This information is not intended to  replace advice given to you by your health care provider. Make sure you discuss any questions you have with your health care provider. Document Released: 04/07/2005 Document Revised: 07/30/2015 Document Reviewed: 01/17/2013 Elsevier Interactive Patient Education  2017 Elsevier Inc.  

## 2016-06-06 NOTE — Progress Notes (Signed)
Low-risk OB appointment G1P0000 6558w4d Estimated Date of Delivery: 06/16/16 BP 104/62   Pulse 78   Wt 150 lb 12.8 oz (68.4 kg)   LMP 09/18/2015 (Approximate)   BMI 25.88 kg/m   BP, weight, and urine reviewed.  Refer to obstetrical flow sheet for FH & FHR.  Reports good fm.  Denies regular uc's, lof, vb, or uti s/s. Tired, wants to come out of work. Discussed not medically necessary, but she can discuss w/ work and go ahead and come out on her own if she prefers. Will have work fax [employer], wants today to be her last day.  SVE per request: cl/th/-2, vtx Reviewed labor s/s, fkc. Plan:  Continue routine obstetrical care  F/U in 1wk for OB appointment  FH still s<d, efw 50%/afi 14.2 on Monday

## 2016-06-13 ENCOUNTER — Encounter: Payer: Self-pay | Admitting: Women's Health

## 2016-06-13 ENCOUNTER — Ambulatory Visit (INDEPENDENT_AMBULATORY_CARE_PROVIDER_SITE_OTHER): Payer: BLUE CROSS/BLUE SHIELD | Admitting: Women's Health

## 2016-06-13 VITALS — BP 98/54 | HR 98 | Wt 154.5 lb

## 2016-06-13 DIAGNOSIS — Z3403 Encounter for supervision of normal first pregnancy, third trimester: Secondary | ICD-10-CM

## 2016-06-13 DIAGNOSIS — Z331 Pregnant state, incidental: Secondary | ICD-10-CM

## 2016-06-13 DIAGNOSIS — O480 Post-term pregnancy: Secondary | ICD-10-CM

## 2016-06-13 DIAGNOSIS — Z1389 Encounter for screening for other disorder: Secondary | ICD-10-CM

## 2016-06-13 DIAGNOSIS — Z3A40 40 weeks gestation of pregnancy: Secondary | ICD-10-CM

## 2016-06-13 LAB — POCT URINALYSIS DIPSTICK
Blood, UA: NEGATIVE
GLUCOSE UA: NEGATIVE
KETONES UA: NEGATIVE
Nitrite, UA: NEGATIVE
Protein, UA: NEGATIVE

## 2016-06-13 NOTE — Progress Notes (Signed)
Low-risk OB appointment G1P0000 2766w4d Estimated Date of Delivery: 06/16/16 BP (!) 98/54   Pulse 98   Wt 154 lb 8 oz (70.1 kg)   LMP 09/18/2015 (Approximate)   BMI 26.52 kg/m   BP, weight, and urine reviewed.  Refer to obstetrical flow sheet for FH & FHR.  Reports good fm.  Denies regular uc's, lof, vb, or uti s/s. Some occ mild sob, not associated w/ any other sx. Discussed can be normal d/t pressure on diaphragm/lungs, discussed reasons to seek care.  Reviewed labor s/s, fkc. Plan:  Continue routine obstetrical care  F/U in 1wk for OB appointment

## 2016-06-13 NOTE — Patient Instructions (Signed)
Call the office ([phone]) or go to [facility] if:  You begin to have strong, frequent contractions  Your water breaks.  Sometimes it is a big gush of fluid, sometimes it is just a trickle that keeps getting your panties wet or running down your legs  You have vaginal bleeding.  It is normal to have a small amount of spotting if your cervix was checked.   You don't feel your baby moving like normal.  If you don't, get you something to eat and drink and lay down and focus on feeling your baby move.  You should feel at least 10 movements in 2 hours.  If you don't, you should call the office or go to [facility].     Braxton Hicks Contractions Contractions of the uterus can occur throughout pregnancy. Contractions are not always a sign that you are in labor.  WHAT ARE BRAXTON HICKS CONTRACTIONS?  Contractions that occur before labor are called Braxton Hicks contractions, or false labor. Toward the end of pregnancy (32-34 weeks), these contractions can develop more often and may become more forceful. This is not true labor because these contractions do not result in opening (dilatation) and thinning of the cervix. They are sometimes difficult to tell apart from true labor because these contractions can be forceful and people have different pain tolerances. You should not feel embarrassed if you go to the hospital with false labor. Sometimes, the only way to tell if you are in true labor is for your health care provider to look for changes in the cervix. If there are no prenatal problems or other health problems associated with the pregnancy, it is completely safe to be sent home with false labor and await the onset of true labor. HOW CAN YOU TELL THE DIFFERENCE BETWEEN TRUE AND FALSE LABOR? False Labor   The contractions of false labor are usually shorter and not as hard as those of true labor.   The contractions are usually irregular.   The contractions are often felt in the front of  the lower abdomen and in the groin.   The contractions may go away when you walk around or change positions while lying down.   The contractions get weaker and are shorter lasting as time goes on.   The contractions do not usually become progressively stronger, regular, and closer together as with true labor.  True Labor   Contractions in true labor last 30-70 seconds, become very regular, usually become more intense, and increase in frequency.   The contractions do not go away with walking.   The discomfort is usually felt in the top of the uterus and spreads to the lower abdomen and low back.   True labor can be determined by your health care provider with an exam. This will show that the cervix is dilating and getting thinner.  WHAT TO REMEMBER  Keep up with your usual exercises and follow other instructions given by your health care provider.   Take medicines as directed by your health care provider.   Keep your regular prenatal appointments.   Eat and drink lightly if you think you are going into labor.   If Braxton Hicks contractions are making you uncomfortable:   Change your position from lying down or resting to walking, or from walking to resting.   Sit and rest in a tub of warm water.   Drink 2-3 glasses of water. Dehydration may cause these contractions.   Do slow and deep breathing several   times an hour.  WHEN SHOULD I SEEK IMMEDIATE MEDICAL CARE? Seek immediate medical care if:  Your contractions become stronger, more regular, and closer together.   You have fluid leaking or gushing from your vagina.   You have a fever.   You pass blood-tinged mucus.   You have vaginal bleeding.   You have continuous abdominal pain.   You have low back pain that you never had before.   You feel your baby's head pushing down and causing pelvic pressure.   Your baby is not moving as much as it used to.  This information is not intended to  replace advice given to you by your health care provider. Make sure you discuss any questions you have with your health care provider. Document Released: 04/07/2005 Document Revised: 07/30/2015 Document Reviewed: 01/17/2013 Elsevier Interactive Patient Education  2017 Elsevier Inc.  

## 2016-06-18 ENCOUNTER — Ambulatory Visit (INDEPENDENT_AMBULATORY_CARE_PROVIDER_SITE_OTHER): Payer: BLUE CROSS/BLUE SHIELD | Admitting: Advanced Practice Midwife

## 2016-06-18 ENCOUNTER — Encounter: Payer: Self-pay | Admitting: Advanced Practice Midwife

## 2016-06-18 VITALS — BP 108/60 | HR 80 | Wt 157.0 lb

## 2016-06-18 DIAGNOSIS — Z1389 Encounter for screening for other disorder: Secondary | ICD-10-CM

## 2016-06-18 DIAGNOSIS — Z3A41 41 weeks gestation of pregnancy: Secondary | ICD-10-CM

## 2016-06-18 DIAGNOSIS — O480 Post-term pregnancy: Secondary | ICD-10-CM

## 2016-06-18 DIAGNOSIS — Z3403 Encounter for supervision of normal first pregnancy, third trimester: Secondary | ICD-10-CM

## 2016-06-18 DIAGNOSIS — Z331 Pregnant state, incidental: Secondary | ICD-10-CM

## 2016-06-18 LAB — POCT URINALYSIS DIPSTICK
GLUCOSE UA: NEGATIVE
Ketones, UA: NEGATIVE
Leukocytes, UA: NEGATIVE
Nitrite, UA: NEGATIVE
Protein, UA: NEGATIVE
RBC UA: NEGATIVE

## 2016-06-18 NOTE — Progress Notes (Signed)
G1P0000 3075w2d Estimated Date of Delivery: 06/16/16  Blood pressure 108/60, pulse 80, weight 157 lb (71.2 kg), last menstrual period 09/18/2015.   BP weight and urine results all reviewed and noted.  Please refer to the obstetrical flow sheet for the fundal height and fetal heart rate documentation:  Patient reports good fetal movement, denies any bleeding and no rupture of membranes symptoms or regular contractions. Patient is without complaints. All questions were answered.  Orders Placed This Encounter  Procedures  . POCT urinalysis dipstick    Plan:  Continued routine obstetrical care, IOL scheduled for MD on 3/5 if undelivered  Return in about 6 weeks (around 07/30/2016) for postpartum.

## 2016-06-18 NOTE — Patient Instructions (Signed)
If you are still pregnant on March 4 at 11:45 pm,, [facility] ([address] [place], [place]) to start your induction!  Eat a light meal before you come if you are hungry at all. Labor Induction Labor induction is when steps are taken to cause a pregnant woman to begin the labor process. Most women go into labor on their own between 37 weeks and 42 weeks of the pregnancy. When this does not happen or when there is a medical need, methods may be used to induce labor. Labor induction causes a pregnant woman's uterus to contract. It also causes the cervix to soften (ripen), open (dilate), and thin out (efface). Usually, labor is not induced before 39 weeks of the pregnancy unless there is a problem with the baby or mother. Before inducing labor, your health care provider will consider a number of factors, including the following:  The medical condition of you and the baby.  How many weeks along you are.  The status of the baby's lung maturity.  The condition of the cervix.  The position of the baby. What are the reasons for labor induction? Labor may be induced for the following reasons:  The health of the baby or mother is at risk.  The pregnancy is overdue by 1 week or more.  The water breaks but labor does not start on its own.  The mother has a health condition or serious illness, such as high blood pressure, infection, placental abruption, or diabetes.  The amniotic fluid amounts are low around the baby.  The baby is distressed. Convenience or wanting the baby to be born on a certain date is not a reason for inducing labor. What methods are used for labor induction? Several methods of labor induction may be used, such as:  Prostaglandin medicine. This medicine causes the cervix to dilate and ripen. The medicine will also start contractions. It can be taken by mouth or by inserting a suppository into the vagina.  Inserting a thin tube (catheter)  with a balloon on the end into the vagina to dilate the cervix. Once inserted, the balloon is expanded with water, which causes the cervix to open.  Stripping the membranes. Your health care provider separates amniotic sac tissue from the cervix, causing the cervix to be stretched and causing the release of a hormone called progesterone. This may cause the uterus to contract. It is often done during an office visit. You will be sent home to wait for the contractions to begin. You will then come in for an induction.  Breaking the water. Your health care provider makes a hole in the amniotic sac using a small instrument. Once the amniotic sac breaks, contractions should begin. This may still take hours to see an effect.  Medicine to trigger or strengthen contractions. This medicine is given through an IV access tube inserted into a vein in your arm. All of the methods of induction, besides stripping the membranes, will be done in the hospital. Induction is done in the hospital so that you and the baby can be carefully monitored. How long does it take for labor to be induced? Some inductions can take up to 2-3 days. Depending on the cervix, it usually takes less time. It takes longer when you are induced early in the pregnancy or if this is your first pregnancy. If a mother is still pregnant and the induction has been going on for 2-3 days, either the mother will be sent  home or a cesarean delivery will be needed. What are the risks associated with labor induction? Some of the risks of induction include:  Changes in fetal heart rate, such as too high, too low, or erratic.  Fetal distress.  Chance of infection for the mother and baby.  Increased chance of having a cesarean delivery.  Breaking off (abruption) of the placenta from the uterus (rare).  Uterine rupture (very rare). When induction is needed for medical reasons, the benefits of induction may outweigh the risks. What are some reasons  for not inducing labor? Labor induction should not be done if:  It is shown that your baby does not tolerate labor.  You have had previous surgeries on your uterus, such as a myomectomy or the removal of fibroids.  Your placenta lies very low in the uterus and blocks the opening of the cervix (placenta previa).  Your baby is not in a head-down position.  The umbilical cord drops down into the birth canal in front of the baby. This could cut off the baby's blood and oxygen supply.  You have had a previous cesarean delivery.  There are unusual circumstances, such as the baby being extremely premature. This information is not intended to replace advice given to you by your health care provider. Make sure you discuss any questions you have with your health care provider. Document Released: 08/27/2006 Document Revised: 09/13/2015 Document Reviewed: 11/04/2012 Elsevier Interactive Patient Education  2017 Elsevier Inc.   Daisey Must!!

## 2016-06-18 NOTE — Progress Notes (Signed)
   Induction Assessment Scheduling Form: [facility]ax to [facility]:  [phone]  [name]                                                                                   DOB:  07/22/1990                                                            MRN:  [id number]                                                                     Phone #:   [phone]                         Provider:  [facility]  GP:  G1P0000                                                            Estimated Date of Delivery: 06/16/16  Dating Criteria: early US    Medical Indications for induction:  postdates Admission Date/Time:  3/5 at midnight Gestational age on admission:  4541   Filed Weights   06/18/16 1556  Weight: 157 lb (71.2 kg)   HIV:  Non Reactive (11/27 0913) GBS: Negative (02/09 1432)  LTC cx   Method of induction(proposed):  cytotec    Scheduling Provider Signature:  Annia FriendlyCRESENZO-DISHMAN,Megan Tran, CNM                                            Today's Date:  06/18/2016

## 2016-06-19 ENCOUNTER — Inpatient Hospital Stay (HOSPITAL_COMMUNITY)
Admission: AD | Admit: 2016-06-19 | Discharge: 2016-06-20 | Disposition: A | Discharge status: Home / Self Care | Payer: BLUE CROSS/BLUE SHIELD | Source: Ambulatory Visit | Attending: Obstetrics & Gynecology | Admitting: Obstetrics & Gynecology

## 2016-06-19 ENCOUNTER — Telehealth: Payer: Self-pay | Admitting: *Deleted

## 2016-06-19 ENCOUNTER — Telehealth (HOSPITAL_COMMUNITY): Payer: Self-pay | Admitting: *Deleted

## 2016-06-19 DIAGNOSIS — O471 False labor at or after 37 completed weeks of gestation: Secondary | ICD-10-CM

## 2016-06-19 DIAGNOSIS — Z3403 Encounter for supervision of normal first pregnancy, third trimester: Secondary | ICD-10-CM

## 2016-06-19 NOTE — Telephone Encounter (Signed)
Patient called stating she was having some cramping and spotting since last night. She was seen yesterday in our office and was checked. Informed patient that she could be bleeding from her exam and may be starting early labor. [facility] call us back if she started having heavier bleeding. Pt verbalized understanding.

## 2016-06-19 NOTE — Telephone Encounter (Signed)
Preadmission screen  

## 2016-06-20 ENCOUNTER — Other Ambulatory Visit: Payer: Self-pay | Admitting: Advanced Practice Midwife

## 2016-06-20 ENCOUNTER — Encounter (HOSPITAL_COMMUNITY): Payer: Self-pay

## 2016-06-20 ENCOUNTER — Inpatient Hospital Stay (HOSPITAL_COMMUNITY)
Admission: AD | Admit: 2016-06-20 | Discharge: 2016-06-20 | Disposition: A | Discharge status: Home / Self Care | Payer: BLUE CROSS/BLUE SHIELD | Source: Ambulatory Visit | Attending: Obstetrics and Gynecology | Admitting: Obstetrics and Gynecology

## 2016-06-20 ENCOUNTER — Encounter (HOSPITAL_COMMUNITY): Payer: Self-pay | Admitting: *Deleted

## 2016-06-20 DIAGNOSIS — Z3403 Encounter for supervision of normal first pregnancy, third trimester: Secondary | ICD-10-CM

## 2016-06-20 DIAGNOSIS — O471 False labor at or after 37 completed weeks of gestation: Secondary | ICD-10-CM

## 2016-06-20 MED ORDER — ZOLPIDEM TARTRATE 5 MG PO TABS
5.0000 mg | ORAL_TABLET | Freq: Once | ORAL | Status: AC
  Administered 2016-06-20: 5 mg via ORAL
  Filled 2016-06-20: qty 1

## 2016-06-20 NOTE — MAU Note (Signed)
PT SAYS SHE FEELS  STRONG  UC  SINCE  10PM.   VE IN OFFICE  - CLOSED.    DENIES HSV AND MRSA.    GBS- NEG

## 2016-06-20 NOTE — MAU Note (Signed)
Contractions every 5-6 min apart all day, worse than when seen here yesterday.  1 cm at last visit.  No bleeding. No leaking. Baby moving today, little less than yesterday.

## 2016-06-20 NOTE — Discharge Instructions (Signed)
Braxton Hicks Contractions °Contractions of the uterus can occur throughout pregnancy, but they are not always a sign that you are in labor. You may have practice contractions called Braxton Hicks contractions. These false labor contractions are sometimes confused with true labor. °What are Braxton Hicks contractions? °Braxton Hicks contractions are tightening movements that occur in the muscles of the uterus before labor. Unlike true labor contractions, these contractions do not result in opening (dilation) and thinning of the cervix. Toward the end of pregnancy (32-34 weeks), Braxton Hicks contractions can happen more often and may become stronger. These contractions are sometimes difficult to tell apart from true labor because they can be very uncomfortable. You should not feel embarrassed if you go to the hospital with false labor. °Sometimes, the only way to tell if you are in true labor is for your health care provider to look for changes in the cervix. The health care provider will do a physical exam and may monitor your contractions. If you are not in true labor, the exam should show that your cervix is not dilating and your water has not broken. °If there are no prenatal problems or other health problems associated with your pregnancy, it is completely safe for you to be sent home with false labor. You may continue to have Braxton Hicks contractions until you go into true labor. °How can I tell the difference between true labor and false labor? °· Differences °¨ False labor °¨ Contractions last 30-70 seconds.: Contractions are usually shorter and not as strong as true labor contractions. °¨ Contractions become very regular.: Contractions are usually irregular. °¨ Discomfort is usually felt in the top of the uterus, and it spreads to the lower abdomen and low back.: Contractions are often felt in the front of the lower abdomen and in the groin. °¨ Contractions do not go away with walking.: Contractions may  go away when you walk around or change positions while lying down. °¨ Contractions usually become more intense and increase in frequency.: Contractions get weaker and are shorter-lasting as time goes on. °¨ The cervix dilates and gets thinner.: The cervix usually does not dilate or become thin. °Follow these instructions at home: °¨ Take over-the-counter and prescription medicines only as told by your health care provider. °¨ Keep up with your usual exercises and follow other instructions from your health care provider. °¨ Eat and drink lightly if you think you are going into labor. °¨ If Braxton Hicks contractions are making you uncomfortable: °¨ Change your position from lying down or resting to walking, or change from walking to resting. °¨ Sit and rest in a tub of warm water. °¨ Drink enough fluid to keep your urine clear or pale yellow. Dehydration may cause these contractions. °¨ Do slow and deep breathing several times an hour. °¨ Keep all follow-up prenatal visits as told by your health care provider. This is important. °Contact a health care provider if: °¨ You have a fever. °¨ You have continuous pain in your abdomen. °Get help right away if: °¨ Your contractions become stronger, more regular, and closer together. °¨ You have fluid leaking or gushing from your vagina. °¨ You pass blood-tinged mucus (bloody show). °¨ You have bleeding from your vagina. °¨ You have low back pain that you never had before. °¨ You feel your baby’s head pushing down and causing pelvic pressure. °¨ Your baby is not moving inside you as much as it used to. °Summary °¨ Contractions that occur before labor are   called Braxton Hicks contractions, false labor, or practice contractions. °¨ Braxton Hicks contractions are usually shorter, weaker, farther apart, and less regular than true labor contractions. True labor contractions usually become progressively stronger and regular and they become more frequent. °¨ Manage discomfort from  Braxton Hicks contractions by changing position, resting in a warm bath, drinking plenty of water, or practicing deep breathing. °This information is not intended to replace advice given to you by your health care provider. Make sure you discuss any questions you have with your health care provider. °Document Released: 04/07/2005 Document Revised: 02/25/2016 Document Reviewed: 02/25/2016 °Elsevier Interactive Patient Education © 2017 Elsevier Inc. °Fetal Movement Counts °Patient Name: ________________________________________________ Patient Due Date: ____________________ °What is a fetal movement count? °A fetal movement count is the number of times that you feel your baby move during a certain amount of time. This may also be called a fetal kick count. A fetal movement count is recommended for every pregnant woman. You may be asked to start counting fetal movements as early as week 28 of your pregnancy. °Pay attention to when your baby is most active. You may notice your baby's sleep and wake cycles. You may also notice things that make your baby move more. You should do a fetal movement count: °· When your baby is normally most active. °· At the same time each day. °A good time to count movements is while you are resting, after having something to eat and drink. °How do I count fetal movements? °1. Find a quiet, comfortable area. Sit, or lie down on your side. °2. Write down the date, the start time and stop time, and the number of movements that you felt between those two times. Take this information with you to your health care visits. °3. For 2 hours, count kicks, flutters, swishes, rolls, and jabs. You should feel at least 10 movements during 2 hours. °4. You may stop counting after you have felt 10 movements. °5. If you do not feel 10 movements in 2 hours, have something to eat and drink. Then, keep resting and counting for 1 hour. If you feel at least 4 movements during that hour, you may stop  counting. °Contact a health care provider if: °· You feel fewer than 4 movements in 2 hours. °· Your baby is not moving like he or she usually does. °Date: ____________ Start time: ____________ Stop time: ____________ Movements: ____________ °Date: ____________ Start time: ____________ Stop time: ____________ Movements: ____________ °Date: ____________ Start time: ____________ Stop time: ____________ Movements: ____________ °Date: ____________ Start time: ____________ Stop time: ____________ Movements: ____________ °Date: ____________ Start time: ____________ Stop time: ____________ Movements: ____________ °Date: ____________ Start time: ____________ Stop time: ____________ Movements: ____________ °Date: ____________ Start time: ____________ Stop time: ____________ Movements: ____________ °Date: ____________ Start time: ____________ Stop time: ____________ Movements: ____________ °Date: ____________ Start time: ____________ Stop time: ____________ Movements: ____________ °This information is not intended to replace advice given to you by your health care provider. Make sure you discuss any questions you have with your health care provider. °Document Released: 05/07/2006 Document Revised: 12/05/2015 Document Reviewed: 05/17/2015 °Elsevier Interactive Patient Education © 2017 Elsevier Inc. ° °

## 2016-06-20 NOTE — MAU Note (Signed)
I have communicated with Philipp DeputyKim Shaw [employer] and reviewed vital signs:  Vitals:   06/20/16 2129 06/20/16 2215  BP: 121/74 123/77  Pulse: 86 86  Resp: 16 16  Temp: 99.2 F (37.3 C) 98.6 F (37 C)    Vaginal exam:  Dilation: 1 (1-1.5) Effacement (%): 80, 90 Cervical Position: Posterior Station: -2 Presentation: Vertex Exam by:: Avery DennisonBenji Micaela Stith RN,   Also reviewed contraction pattern and that non-stress test is reactive.  It has been documented that patient is contracting every 4-6 minutes, after giving her the option of walking and be rechecked in an hour, patient decided she does not want to stay and prefers to receive medication for sleep and go home.   Pt's mother is driving.  Patient denies any other complaints.  Based on this report provider has given order for discharge.  A discharge diagnosis will be entered by a provider.  Labor discharge instructions reviewed with patient.

## 2016-06-20 NOTE — MAU Note (Signed)
I have communicated with Thressa ShellerHeather Hogan CNM and reviewed vital signs:  Vitals:   06/20/16 0010  BP: 128/79  Pulse: 89  Resp: 20  Temp: 98.5 F (36.9 C)    Vaginal exam:  Dilation: 1 Cervical Position: Posterior Exam by:: Iran Kievit, RN,   Also reviewed contraction pattern and that non-stress test is reactive.  It has been documented that patient is not contracting and rating her pain a 3/10 not indicating active labor.  Patient denies any other complaints.  Based on this report provider has given order for discharge.  A discharge diagnosis will be entered by a provider.  Labor discharge instructions reviewed with patient.

## 2016-06-20 NOTE — Discharge Instructions (Signed)
Fetal Movement Counts Patient Name: ________________________________________________ Patient Due Date: ____________________ What is a fetal movement count? A fetal movement count is the number of times that you feel your baby move during a certain amount of time. This may also be called a fetal kick count. A fetal movement count is recommended for every pregnant woman. You may be asked to start counting fetal movements as early as week 28 of your pregnancy. Pay attention to when your baby is most active. You may notice your baby's sleep and wake cycles. You may also notice things that make your baby move more. You should do a fetal movement count:  When your baby is normally most active.  At the same time each day. A good time to count movements is while you are resting, after having something to eat and drink. How do I count fetal movements? 1. Find a quiet, comfortable area. Sit, or lie down on your side. 2. Write down the date, the start time and stop time, and the number of movements that you felt between those two times. Take this information with you to your health care visits. 3. For 2 hours, count kicks, flutters, swishes, rolls, and jabs. You should feel at least 10 movements during 2 hours. 4. You may stop counting after you have felt 10 movements. 5. If you do not feel 10 movements in 2 hours, have something to eat and drink. Then, keep resting and counting for 1 hour. If you feel at least 4 movements during that hour, you may stop counting. Contact a health care provider if:  You feel fewer than 4 movements in 2 hours.  Your baby is not moving like he or she usually does. Date: ____________ Start time: ____________ Stop time: ____________ Movements: ____________ Date: ____________ Start time: ____________ Stop time: ____________ Movements: ____________ Date: ____________ Start time: ____________ Stop time: ____________ Movements: ____________ Date: ____________ Start time:  ____________ Stop time: ____________ Movements: ____________ Date: ____________ Start time: ____________ Stop time: ____________ Movements: ____________ Date: ____________ Start time: ____________ Stop time: ____________ Movements: ____________ Date: ____________ Start time: ____________ Stop time: ____________ Movements: ____________ Date: ____________ Start time: ____________ Stop time: ____________ Movements: ____________ Date: ____________ Start time: ____________ Stop time: ____________ Movements: ____________ This information is not intended to replace advice given to you by your health care provider. Make sure you discuss any questions you have with your health care provider. Document Released: 05/07/2006 Document Revised: 12/05/2015 Document Reviewed: 05/17/2015 Elsevier Interactive Patient Education  2017 [employer] Braxton Hicks Contractions Contractions of the uterus can occur throughout pregnancy, but they are not always a sign that you are in labor. You may have practice contractions called Braxton Hicks contractions. These false labor contractions are sometimes confused with true labor. What are Braxton Hicks contractions? Braxton Hicks contractions are tightening movements that occur in the muscles of the uterus before labor. Unlike true labor contractions, these contractions do not result in opening (dilation) and thinning of the cervix. Toward the end of pregnancy (32-34 weeks), Braxton Hicks contractions can happen more often and may become stronger. These contractions are sometimes difficult to tell apart from true labor because they can be very uncomfortable. You should not feel embarrassed if you go to the hospital with false labor. Sometimes, the only way to tell if you are in true labor is for your health care provider to look for changes in the cervix. The health care provider will do a physical exam and may monitor your contractions. If you   are not in true labor, the exam  should show that your cervix is not dilating and your water has not broken. If there are no prenatal problems or other health problems associated with your pregnancy, it is completely safe for you to be sent home with false labor. You may continue to have Braxton Hicks contractions until you go into true labor. How can I tell the difference between true labor and false labor?  Differences  False labor  Contractions last 30-70 seconds.: Contractions are usually shorter and not as strong as true labor contractions.  Contractions become very regular.: Contractions are usually irregular.  Discomfort is usually felt in the top of the uterus, and it spreads to the lower abdomen and low back.: Contractions are often felt in the front of the lower abdomen and in the groin.  Contractions do not go away with walking.: Contractions may go away when you walk around or change positions while lying down.  Contractions usually become more intense and increase in frequency.: Contractions get weaker and are shorter-lasting as time goes on.  The cervix dilates and gets thinner.: The cervix usually does not dilate or become thin. Follow these instructions at home:  Take over-the-counter and prescription medicines only as told by your health care provider.  Keep up with your usual exercises and follow other instructions from your health care provider.  Eat and drink lightly if you think you are going into labor.  If Braxton Hicks contractions are making you uncomfortable:  Change your position from lying down or resting to walking, or change from walking to resting.  Sit and rest in a tub of warm water.  Drink enough fluid to keep your urine clear or pale yellow. Dehydration may cause these contractions.  Do slow and deep breathing several times an hour.  Keep all follow-up prenatal visits as told by your health care provider. This is important. Contact a health care provider if:  You have a  fever.  You have continuous pain in your abdomen. Get help right away if:  Your contractions become stronger, more regular, and closer together.  You have fluid leaking or gushing from your vagina.  You pass blood-tinged mucus (bloody show).  You have bleeding from your vagina.  You have low back pain that you never had before.  You feel your baby's head pushing down and causing pelvic pressure.  Your baby is not moving inside you as much as it used to. Summary  Contractions that occur before labor are called Braxton Hicks contractions, false labor, or practice contractions.  Braxton Hicks contractions are usually shorter, weaker, farther apart, and less regular than true labor contractions. True labor contractions usually become progressively stronger and regular and they become more frequent.  Manage discomfort from Braxton Hicks contractions by changing position, resting in a warm bath, drinking plenty of water, or practicing deep breathing. This information is not intended to replace advice given to you by your health care provider. Make sure you discuss any questions you have with your health care provider. Document Released: 04/07/2005 Document Revised: 02/25/2016 Document Reviewed: 02/25/2016 Elsevier Interactive Patient Education  2017 Elsevier Inc[employer]  

## 2016-06-21 ENCOUNTER — Inpatient Hospital Stay (HOSPITAL_COMMUNITY)
Admission: AD | Admit: 2016-06-21 | Discharge: 2016-06-21 | Disposition: A | Discharge status: Home / Self Care | Payer: BLUE CROSS/BLUE SHIELD | Source: Ambulatory Visit | Attending: Obstetrics and Gynecology | Admitting: Obstetrics and Gynecology

## 2016-06-21 ENCOUNTER — Encounter (HOSPITAL_COMMUNITY): Payer: Self-pay | Admitting: *Deleted

## 2016-06-21 DIAGNOSIS — O479 False labor, unspecified: Secondary | ICD-10-CM

## 2016-06-21 DIAGNOSIS — Z3403 Encounter for supervision of normal first pregnancy, third trimester: Secondary | ICD-10-CM

## 2016-06-21 MED ORDER — OXYCODONE-ACETAMINOPHEN 5-325 MG PO TABS
1.0000 | ORAL_TABLET | Freq: Once | ORAL | Status: AC
  Administered 2016-06-21: 1 via ORAL
  Filled 2016-06-21: qty 1

## 2016-06-21 NOTE — Discharge Instructions (Signed)
Braxton Hicks Contractions °Contractions of the uterus can occur throughout pregnancy, but they are not always a sign that you are in labor. You may have practice contractions called Braxton Hicks contractions. These false labor contractions are sometimes confused with true labor. °What are Braxton Hicks contractions? °Braxton Hicks contractions are tightening movements that occur in the muscles of the uterus before labor. Unlike true labor contractions, these contractions do not result in opening (dilation) and thinning of the cervix. Toward the end of pregnancy (32-34 weeks), Braxton Hicks contractions can happen more often and may become stronger. These contractions are sometimes difficult to tell apart from true labor because they can be very uncomfortable. You should not feel embarrassed if you go to the hospital with false labor. °Sometimes, the only way to tell if you are in true labor is for your health care provider to look for changes in the cervix. The health care provider will do a physical exam and may monitor your contractions. If you are not in true labor, the exam should show that your cervix is not dilating and your water has not broken. °If there are no prenatal problems or other health problems associated with your pregnancy, it is completely safe for you to be sent home with false labor. You may continue to have Braxton Hicks contractions until you go into true labor. °How can I tell the difference between true labor and false labor? °· Differences °¨ False labor °¨ Contractions last 30-70 seconds.: Contractions are usually shorter and not as strong as true labor contractions. °¨ Contractions become very regular.: Contractions are usually irregular. °¨ Discomfort is usually felt in the top of the uterus, and it spreads to the lower abdomen and low back.: Contractions are often felt in the front of the lower abdomen and in the groin. °¨ Contractions do not go away with walking.: Contractions may  go away when you walk around or change positions while lying down. °¨ Contractions usually become more intense and increase in frequency.: Contractions get weaker and are shorter-lasting as time goes on. °¨ The cervix dilates and gets thinner.: The cervix usually does not dilate or become thin. °Follow these instructions at home: °¨ Take over-the-counter and prescription medicines only as told by your health care provider. °¨ Keep up with your usual exercises and follow other instructions from your health care provider. °¨ Eat and drink lightly if you think you are going into labor. °¨ If Braxton Hicks contractions are making you uncomfortable: °¨ Change your position from lying down or resting to walking, or change from walking to resting. °¨ Sit and rest in a tub of warm water. °¨ Drink enough fluid to keep your urine clear or pale yellow. Dehydration may cause these contractions. °¨ Do slow and deep breathing several times an hour. °¨ Keep all follow-up prenatal visits as told by your health care provider. This is important. °Contact a health care provider if: °¨ You have a fever. °¨ You have continuous pain in your abdomen. °Get help right away if: °¨ Your contractions become stronger, more regular, and closer together. °¨ You have fluid leaking or gushing from your vagina. °¨ You pass blood-tinged mucus (bloody show). °¨ You have bleeding from your vagina. °¨ You have low back pain that you never had before. °¨ You feel your baby’s head pushing down and causing pelvic pressure. °¨ Your baby is not moving inside you as much as it used to. °Summary °¨ Contractions that occur before labor are   called Braxton Hicks contractions, false labor, or practice contractions. °¨ Braxton Hicks contractions are usually shorter, weaker, farther apart, and less regular than true labor contractions. True labor contractions usually become progressively stronger and regular and they become more frequent. °¨ Manage discomfort from  Braxton Hicks contractions by changing position, resting in a warm bath, drinking plenty of water, or practicing deep breathing. °This information is not intended to replace advice given to you by your health care provider. Make sure you discuss any questions you have with your health care provider. °Document Released: 04/07/2005 Document Revised: 02/25/2016 Document Reviewed: 02/25/2016 °Elsevier Interactive Patient Education © 2017 Elsevier Inc. °Fetal Movement Counts °Patient Name: ________________________________________________ Patient Due Date: ____________________ °What is a fetal movement count? °A fetal movement count is the number of times that you feel your baby move during a certain amount of time. This may also be called a fetal kick count. A fetal movement count is recommended for every pregnant woman. You may be asked to start counting fetal movements as early as week 28 of your pregnancy. °Pay attention to when your baby is most active. You may notice your baby's sleep and wake cycles. You may also notice things that make your baby move more. You should do a fetal movement count: °· When your baby is normally most active. °· At the same time each day. °A good time to count movements is while you are resting, after having something to eat and drink. °How do I count fetal movements? °1. Find a quiet, comfortable area. Sit, or lie down on your side. °2. Write down the date, the start time and stop time, and the number of movements that you felt between those two times. Take this information with you to your health care visits. °3. For 2 hours, count kicks, flutters, swishes, rolls, and jabs. You should feel at least 10 movements during 2 hours. °4. You may stop counting after you have felt 10 movements. °5. If you do not feel 10 movements in 2 hours, have something to eat and drink. Then, keep resting and counting for 1 hour. If you feel at least 4 movements during that hour, you may stop  counting. °Contact a health care provider if: °· You feel fewer than 4 movements in 2 hours. °· Your baby is not moving like he or she usually does. °Date: ____________ Start time: ____________ Stop time: ____________ Movements: ____________ °Date: ____________ Start time: ____________ Stop time: ____________ Movements: ____________ °Date: ____________ Start time: ____________ Stop time: ____________ Movements: ____________ °Date: ____________ Start time: ____________ Stop time: ____________ Movements: ____________ °Date: ____________ Start time: ____________ Stop time: ____________ Movements: ____________ °Date: ____________ Start time: ____________ Stop time: ____________ Movements: ____________ °Date: ____________ Start time: ____________ Stop time: ____________ Movements: ____________ °Date: ____________ Start time: ____________ Stop time: ____________ Movements: ____________ °Date: ____________ Start time: ____________ Stop time: ____________ Movements: ____________ °This information is not intended to replace advice given to you by your health care provider. Make sure you discuss any questions you have with your health care provider. °Document Released: 05/07/2006 Document Revised: 12/05/2015 Document Reviewed: 05/17/2015 °Elsevier Interactive Patient Education © 2017 Elsevier Inc. ° °

## 2016-06-21 NOTE — MAU Note (Signed)
C/o ucs since thurs morning; seen in MAU earlier but sent home @ 1.5 cm; ucs got stronger at 2300 last night;denies SROM or vaginal bleeding; scheduled for induction at 2300 tomorrow;

## 2016-06-21 NOTE — MAU Note (Signed)
Pt seen MAU last night for labor ck. Was 1.5cm and sent home. Closer and stronger ctxs and lost mucous plug. Denies LOF or bleeding

## 2016-06-22 ENCOUNTER — Inpatient Hospital Stay (HOSPITAL_COMMUNITY)
Admission: AD | Admit: 2016-06-22 | Discharge: 2016-06-24 | DRG: 775 | Disposition: A | Discharge status: Home / Self Care | Payer: BLUE CROSS/BLUE SHIELD | Source: Ambulatory Visit | Attending: Obstetrics and Gynecology | Admitting: Obstetrics and Gynecology

## 2016-06-22 ENCOUNTER — Inpatient Hospital Stay (HOSPITAL_COMMUNITY): Payer: BLUE CROSS/BLUE SHIELD | Admitting: Anesthesiology

## 2016-06-22 ENCOUNTER — Encounter (HOSPITAL_COMMUNITY): Payer: Self-pay

## 2016-06-22 DIAGNOSIS — O480 Post-term pregnancy: Secondary | ICD-10-CM | POA: Diagnosis present

## 2016-06-22 DIAGNOSIS — Z3A41 41 weeks gestation of pregnancy: Secondary | ICD-10-CM

## 2016-06-22 DIAGNOSIS — Z3403 Encounter for supervision of normal first pregnancy, third trimester: Secondary | ICD-10-CM

## 2016-06-22 DIAGNOSIS — Z8249 Family history of ischemic heart disease and other diseases of the circulatory system: Secondary | ICD-10-CM

## 2016-06-22 DIAGNOSIS — Z3493 Encounter for supervision of normal pregnancy, unspecified, third trimester: Secondary | ICD-10-CM | POA: Diagnosis present

## 2016-06-22 DIAGNOSIS — Z87891 Personal history of nicotine dependence: Secondary | ICD-10-CM | POA: Diagnosis not present

## 2016-06-22 DIAGNOSIS — Z3A40 40 weeks gestation of pregnancy: Secondary | ICD-10-CM | POA: Diagnosis not present

## 2016-06-22 LAB — CBC
HEMATOCRIT: 40.5 % (ref 36.0–46.0)
HEMOGLOBIN: 13.9 g/dL (ref 12.0–15.0)
MCH: 30.8 pg (ref 26.0–34.0)
MCHC: 34.3 g/dL (ref 30.0–36.0)
MCV: 89.6 fL (ref 78.0–100.0)
Platelets: 180 10*3/uL (ref 150–400)
RBC: 4.52 MIL/uL (ref 3.87–5.11)
RDW: 13.5 % (ref 11.5–15.5)
WBC: 13.9 10*3/uL — AB (ref 4.0–10.5)

## 2016-06-22 LAB — TYPE AND SCREEN
ABO/RH(D): A POS
ANTIBODY SCREEN: NEGATIVE

## 2016-06-22 LAB — ABO/RH: ABO/RH(D): A POS

## 2016-06-22 LAB — RPR: RPR Ser Ql: NONREACTIVE

## 2016-06-22 MED ORDER — OXYCODONE HCL 5 MG PO TABS
5.0000 mg | ORAL_TABLET | ORAL | Status: DC | PRN
  Administered 2016-06-22 – 2016-06-23 (×4): 5 mg via ORAL
  Filled 2016-06-22 (×4): qty 1

## 2016-06-22 MED ORDER — ONDANSETRON HCL 4 MG/2ML IJ SOLN: 4.0000 mg | Freq: Four times a day (QID) | INTRAMUSCULAR | Status: DC | PRN

## 2016-06-22 MED ORDER — ACETAMINOPHEN 325 MG PO TABS
650.0000 mg | ORAL_TABLET | ORAL | Status: DC | PRN
Start: 2016-06-22 — End: 2016-06-22

## 2016-06-22 MED ORDER — LACTATED RINGERS IV SOLN: 500.0000 mL | INTRAVENOUS | Status: DC | PRN

## 2016-06-22 MED ORDER — LIDOCAINE HCL (PF) 1 % IJ SOLN: 30.0000 mL | INTRAMUSCULAR | Status: DC | PRN

## 2016-06-22 MED ORDER — SOD CITRATE-CITRIC ACID 500-334 MG/5ML PO SOLN: 30.0000 mL | ORAL | Status: DC | PRN

## 2016-06-22 MED ORDER — ZOLPIDEM TARTRATE 5 MG PO TABS: 5.0000 mg | ORAL_TABLET | Freq: Every evening | ORAL | Status: DC | PRN

## 2016-06-22 MED ORDER — ONDANSETRON HCL 4 MG/2ML IJ SOLN
4.0000 mg | INTRAMUSCULAR | Status: DC | PRN
Start: 2016-06-22 — End: 2016-06-24

## 2016-06-22 MED ORDER — SODIUM CHLORIDE 0.9 % IV SOLN: 250.0000 mL | INTRAVENOUS | Status: DC | PRN

## 2016-06-22 MED ORDER — MISOPROSTOL 25 MCG QUARTER TABLET
25.0000 ug | ORAL_TABLET | ORAL | Status: DC | PRN
  Filled 2016-06-22: qty 1

## 2016-06-22 MED ORDER — BENZOCAINE-MENTHOL 20-0.5 % EX AERO
1.0000 "application " | INHALATION_SPRAY | CUTANEOUS | Status: DC | PRN
  Administered 2016-06-23 – 2016-06-24 (×2): 1 via TOPICAL
  Filled 2016-06-22 (×4): qty 56

## 2016-06-22 MED ORDER — SODIUM BICARBONATE 8.4 % IV SOLN
INTRAVENOUS | Status: DC | PRN
  Administered 2016-06-22 (×2): 5 mL via EPIDURAL

## 2016-06-22 MED ORDER — ONDANSETRON HCL 4 MG PO TABS: 4.0000 mg | ORAL_TABLET | ORAL | Status: DC | PRN

## 2016-06-22 MED ORDER — SODIUM CHLORIDE 0.9% FLUSH: 3.0000 mL | Freq: Two times a day (BID) | INTRAVENOUS | Status: DC

## 2016-06-22 MED ORDER — LACTATED RINGERS IV SOLN
500.0000 mL | Freq: Once | INTRAVENOUS | Status: AC
  Administered 2016-06-22: 500 mL via INTRAVENOUS

## 2016-06-22 MED ORDER — LACTATED RINGERS IV SOLN: INTRAVENOUS | Status: DC

## 2016-06-22 MED ORDER — LIDOCAINE HCL (PF) 1 % IJ SOLN
30.0000 mL | INTRAMUSCULAR | Status: DC | PRN
  Filled 2016-06-22: qty 30

## 2016-06-22 MED ORDER — DIBUCAINE 1 % RE OINT: 1.0000 "application " | TOPICAL_OINTMENT | RECTAL | Status: DC | PRN

## 2016-06-22 MED ORDER — SODIUM CHLORIDE 0.9% FLUSH: 3.0000 mL | INTRAVENOUS | Status: DC | PRN

## 2016-06-22 MED ORDER — SENNOSIDES-DOCUSATE SODIUM 8.6-50 MG PO TABS
2.0000 | ORAL_TABLET | ORAL | Status: DC
  Administered 2016-06-22 – 2016-06-23 (×2): 2 via ORAL
  Filled 2016-06-22 (×2): qty 2

## 2016-06-22 MED ORDER — DIPHENHYDRAMINE HCL 25 MG PO CAPS: 25.0000 mg | ORAL_CAPSULE | Freq: Four times a day (QID) | ORAL | Status: DC | PRN

## 2016-06-22 MED ORDER — FENTANYL CITRATE (PF) 100 MCG/2ML IJ SOLN: 100.0000 ug | INTRAMUSCULAR | Status: DC | PRN

## 2016-06-22 MED ORDER — OXYCODONE-ACETAMINOPHEN 5-325 MG PO TABS: 2.0000 | ORAL_TABLET | ORAL | Status: DC | PRN

## 2016-06-22 MED ORDER — TETANUS-DIPHTH-ACELL PERTUSSIS 5-2.5-18.5 LF-MCG/0.5 IM SUSP: 0.5000 mL | Freq: Once | INTRAMUSCULAR | Status: DC

## 2016-06-22 MED ORDER — OXYTOCIN 40 UNITS IN LACTATED RINGERS INFUSION - SIMPLE MED: 2.5000 [IU]/h | INTRAVENOUS | Status: DC

## 2016-06-22 MED ORDER — EPHEDRINE 5 MG/ML INJ
10.0000 mg | INTRAVENOUS | Status: DC | PRN
  Filled 2016-06-22: qty 4

## 2016-06-22 MED ORDER — TERBUTALINE SULFATE 1 MG/ML IJ SOLN: 0.2500 mg | Freq: Once | INTRAMUSCULAR | Status: DC | PRN

## 2016-06-22 MED ORDER — MEASLES, MUMPS & RUBELLA VAC ~~LOC~~ INJ
0.5000 mL | INJECTION | Freq: Once | SUBCUTANEOUS | Status: DC
  Filled 2016-06-22: qty 0.5

## 2016-06-22 MED ORDER — OXYCODONE-ACETAMINOPHEN 5-325 MG PO TABS: 1.0000 | ORAL_TABLET | ORAL | Status: DC | PRN

## 2016-06-22 MED ORDER — LIDOCAINE HCL (PF) 1 % IJ SOLN
INTRAMUSCULAR | Status: DC | PRN
  Administered 2016-06-22 (×2): 5 mL

## 2016-06-22 MED ORDER — OXYCODONE HCL 5 MG PO TABS: 10.0000 mg | ORAL_TABLET | ORAL | Status: DC | PRN

## 2016-06-22 MED ORDER — WITCH HAZEL-GLYCERIN EX PADS
1.0000 "application " | MEDICATED_PAD | CUTANEOUS | Status: DC | PRN
  Administered 2016-06-23: 1 via TOPICAL

## 2016-06-22 MED ORDER — COCONUT OIL OIL
1.0000 "application " | TOPICAL_OIL | Status: DC | PRN
  Filled 2016-06-22: qty 120

## 2016-06-22 MED ORDER — ACETAMINOPHEN 325 MG PO TABS: 650.0000 mg | ORAL_TABLET | ORAL | Status: DC | PRN

## 2016-06-22 MED ORDER — FENTANYL 2.5 MCG/ML BUPIVACAINE 1/10 % EPIDURAL INFUSION (WH - ANES)
14.0000 mL/h | INTRAMUSCULAR | Status: DC | PRN
  Administered 2016-06-22 (×3): 14 mL/h via EPIDURAL
  Filled 2016-06-22 (×2): qty 100

## 2016-06-22 MED ORDER — PRENATAL MULTIVITAMIN CH
1.0000 | ORAL_TABLET | Freq: Every day | ORAL | Status: DC
  Administered 2016-06-23 – 2016-06-24 (×2): 1 via ORAL
  Filled 2016-06-22 (×2): qty 1

## 2016-06-22 MED ORDER — TERBUTALINE SULFATE 1 MG/ML IJ SOLN
0.2500 mg | Freq: Once | INTRAMUSCULAR | Status: DC | PRN
  Filled 2016-06-22: qty 1

## 2016-06-22 MED ORDER — OXYCODONE-ACETAMINOPHEN 5-325 MG PO TABS
1.0000 | ORAL_TABLET | ORAL | Status: DC | PRN
  Administered 2016-06-22: 1 via ORAL
  Filled 2016-06-22: qty 1

## 2016-06-22 MED ORDER — DIPHENHYDRAMINE HCL 50 MG/ML IJ SOLN
12.5000 mg | INTRAMUSCULAR | Status: DC | PRN
  Administered 2016-06-22: 12.5 mg via INTRAVENOUS
  Filled 2016-06-22: qty 1

## 2016-06-22 MED ORDER — ACETAMINOPHEN 325 MG PO TABS
650.0000 mg | ORAL_TABLET | ORAL | Status: DC | PRN
  Administered 2016-06-22: 650 mg via ORAL
  Filled 2016-06-22: qty 2

## 2016-06-22 MED ORDER — OXYTOCIN BOLUS FROM INFUSION: 500.0000 mL | Freq: Once | INTRAVENOUS | Status: DC

## 2016-06-22 MED ORDER — FENTANYL CITRATE (PF) 100 MCG/2ML IJ SOLN
100.0000 ug | INTRAMUSCULAR | Status: DC | PRN
  Administered 2016-06-22: 100 ug via INTRAVENOUS
  Filled 2016-06-22: qty 2

## 2016-06-22 MED ORDER — PHENYLEPHRINE 40 MCG/ML (10ML) SYRINGE FOR IV PUSH (FOR BLOOD PRESSURE SUPPORT)
80.0000 ug | PREFILLED_SYRINGE | INTRAVENOUS | Status: DC | PRN
  Filled 2016-06-22: qty 5
  Filled 2016-06-22 (×2): qty 10

## 2016-06-22 MED ORDER — SIMETHICONE 80 MG PO CHEW: 80.0000 mg | CHEWABLE_TABLET | ORAL | Status: DC | PRN

## 2016-06-22 MED ORDER — PHENYLEPHRINE 40 MCG/ML (10ML) SYRINGE FOR IV PUSH (FOR BLOOD PRESSURE SUPPORT)
80.0000 ug | PREFILLED_SYRINGE | INTRAVENOUS | Status: DC | PRN
  Administered 2016-06-22: 80 ug via INTRAVENOUS
  Filled 2016-06-22: qty 10
  Filled 2016-06-22: qty 5

## 2016-06-22 MED ORDER — IBUPROFEN 600 MG PO TABS
600.0000 mg | ORAL_TABLET | Freq: Four times a day (QID) | ORAL | Status: DC
  Administered 2016-06-22 – 2016-06-24 (×8): 600 mg via ORAL
  Filled 2016-06-22 (×8): qty 1

## 2016-06-22 MED ORDER — OXYTOCIN BOLUS FROM INFUSION
500.0000 mL | Freq: Once | INTRAVENOUS | Status: AC
  Administered 2016-06-22: 500 mL via INTRAVENOUS

## 2016-06-22 MED ORDER — OXYTOCIN 40 UNITS IN LACTATED RINGERS INFUSION - SIMPLE MED
1.0000 m[IU]/min | INTRAVENOUS | Status: DC
  Administered 2016-06-22: 2 m[IU]/min via INTRAVENOUS
  Filled 2016-06-22: qty 1000

## 2016-06-22 MED ORDER — LACTATED RINGERS IV SOLN
INTRAVENOUS | Status: DC
  Administered 2016-06-22 (×2): 125 mL/h via INTRAVENOUS

## 2016-06-22 NOTE — MAU Note (Signed)
Pt c/o contractions every 5 mins today. Denies LOF or vag bleeding. 1.5cm this morning. +FM.

## 2016-06-22 NOTE — Progress Notes (Signed)
Marlynn Perking Lawson CNM called into room to assess perineum.  RN concerned pt may tear

## 2016-06-22 NOTE — H&P (Signed)
LABOR AND DELIVERY ADMISSION HISTORY AND PHYSICAL NOTE  Megan Tran is a 26 y.o. female G1P0000 with IUP at [redacted]w[redacted]d by 8 wk [place] presenting for SOL. Patient reports contractions for the last three days, but reports in the past several hours they have gotten much more severe.  She reports positive fetal movement. She denies leakage of fluid or vaginal bleeding.  Prenatal History/Complications:  Past Medical History: Past Medical History:  Diagnosis Date  . Contraceptive management 03/03/2014  . Medical history non-contributory   . Pregnant 10/19/2015  . Vaginal burning 11/16/2015  . Vaginal discharge during pregnancy in first trimester 11/16/2015  . Yeast infection 11/02/2012    Past Surgical History: Past Surgical History:  Procedure Laterality Date  . MOUTH SURGERY  Oct. 2012    Obstetrical History: OB History    Gravida Para Term Preterm AB Living   1 0 0 0 0 0   SAB TAB Ectopic Multiple Live Births   0 0 0 0        Social History: Social History   Social History  . Marital status: Single    Spouse name: N/A  . Number of children: N/A  . Years of education: N/A   Social History Main Topics  . Smoking status: Former Smoker    Types: Cigarettes  . Smokeless tobacco: Former Neurosurgeon  . Alcohol use No     Comment: occassional; not now  . Drug use: No  . Sexual activity: Not Currently    Birth control/ protection: None   Other Topics Concern  . None   Social History Narrative  . None    Family History: Family History  Problem Relation Age of Onset  . Hypertension Maternal Grandmother   . Hypertension Maternal Grandfather   . Heart disease Maternal Grandfather     Allergies: Allergies  Allergen Reactions  . Cefdinir Hives and Other (See Comments)    Tight in throat    Prescriptions Prior to Admission  Medication Sig Dispense Refill Last Dose  . acetaminophen (TYLENOL) 500 MG tablet Take 1,000 mg by mouth as needed for moderate pain.    06/21/2016  .  calcium carbonate (TUMS - DOSED IN MG ELEMENTAL CALCIUM) 500 MG chewable tablet Chew 1 tablet by mouth daily.   06/21/2016  . prenatal vitamin w/FE, FA (PRENATAL 1 + 1) 27-1 MG TABS tablet Take 1 tablet by mouth daily at 12 noon. 30 each 12 06/21/2016     Review of Systems   All systems reviewed and negative except as stated in HPI  Blood pressure 129/78, pulse 102, temperature 98 F (36.7 C), resp. rate 18, last menstrual period 09/18/2015. General appearance: alert, cooperative and appears stated age Lungs: no respiratory distress, no audible wheezing Heart: regular rate, intact distal pulses Abdomen: soft, non-tender; bowel sounds normal Extremities: No calf swelling or tenderness Presentation: cephalic by nursing exam Fetal monitoring: category 1 Uterine activity: contractions every 3 minutes Dilation: 4.5 Effacement (%): 90 Station: -2 Exam by:: Steward Drone RN   Prenatal labs: ABO, Rh: A/Positive/-- (08/15 1206) Antibody: Negative (11/27 0913) Rubella: immune RPR: Non Reactive (11/27 0913)  HBsAg: Negative (08/15 1206)  HIV: Non Reactive (11/27 0913)  GBS: Negative (02/09 1432)  2 hr Glucola: normal Genetic screening:  normal Anatomy US: normal  Prenatal Transfer Tool  Maternal Diabetes: No Genetic Screening: Normal Maternal Ultrasounds/Referrals: Normal Fetal Ultrasounds or other Referrals:  None Maternal Substance Abuse:  No Significant Maternal Medications:  None Significant Maternal Lab Results: Lab  values include: Group B Strep negative  Results for orders placed or performed during the hospital encounter of 06/22/16 (from the past 24 hour(s))  CBC   Collection Time: 06/22/16  1:02 AM  Result Value Ref Range   WBC 13.9 (H) 4.0 - 10.5 K/uL   RBC 4.52 3.87 - 5.11 MIL/uL   Hemoglobin 13.9 12.0 - 15.0 g/dL   HCT 47.840.5 29.536.0 - 62.146.0 %   MCV 89.6 78.0 - 100.0 fL   MCH 30.8 26.0 - 34.0 pg   MCHC 34.3 30.0 - 36.0 g/dL   RDW 30.813.5 65.711.5 - 84.615.5 %   Platelets 180  150 - 400 K/uL    Patient Active Problem List   Diagnosis Date Noted  . Supervision of normal pregnancy 12/12/2015    Assessment: Megan Tran is a 26 y.o. G1P0000 at 332w6d here for SOL  #Labor:expectant management at this time #Pain: Plans for epidural, IV fentanyl until she can get epidural #FWB: Category 1 #ID:  GBS neg #MOF:  breast #MOC: IUD #Circ:  n/a  Ernestina Pennaicholas Schenk 06/22/2016, 1:15 AM

## 2016-06-22 NOTE — Anesthesia Preprocedure Evaluation (Signed)

## 2016-06-22 NOTE — Progress Notes (Signed)
Patient seen doing well and comfortable with epidural. No other complaints. No som small change in cervix since admission, but contractions have spaced out. AROM for large amount of clear fluid. cAtegory 1 tracing. Plan to start 2x2 pitocin.   Ernestina PennaNicholas Jakaden Ouzts, MD 06/22/16 [phone]

## 2016-06-22 NOTE — Progress Notes (Signed)
25yo G1P0000 female at 4952w6d;  AROM@0638  S: Comfortable. Resting. Laboring down.  O VSS. Latest BP 95/59 while sleeping. afebrile Feal monitoring: baseline 140, acels +; no decels. Moderate variability Toco: 200MVU  A: Active labor. Progressing. Good fetal monitoring  P: Will let her labor down or some more time  Adair LaundryAna Carvalho do Amaral MD PGY1

## 2016-06-22 NOTE — Progress Notes (Signed)
Epidural redosed per Dr Jean RosenthalJackson

## 2016-06-22 NOTE — Progress Notes (Signed)
CN will call back with room assignment

## 2016-06-22 NOTE — Anesthesia Procedure Notes (Signed)
Epidural Patient location during procedure: OB  Staffing Anesthesiologist: Rica Heather Performed: anesthesiologist   Preanesthetic Checklist Completed: patient identified, site marked, surgical consent, pre-op evaluation, timeout performed, IV checked, risks and benefits discussed and monitors and equipment checked  Epidural Patient position: sitting Prep: DuraPrep Patient monitoring: heart rate, continuous pulse ox and blood pressure Approach: right paramedian Location: L3-L4 Injection technique: LOR saline  Needle:  Needle type: Tuohy  Needle gauge: 17 G Needle length: 9 cm and 9 Needle insertion depth: 5 cm Catheter type: closed end flexible Catheter size: 20 Guage Catheter at skin depth: 9 cm Test dose: negative  Assessment Events: blood not aspirated, injection not painful, no injection resistance, negative IV test and no paresthesia  Additional Notes Patient identified. Risks/Benefits/Options discussed with patient including but not limited to bleeding, infection, nerve damage, paralysis, failed block, incomplete pain control, headache, blood pressure changes, nausea, vomiting, reactions to medication both or allergic, itching and postpartum back pain. Confirmed with bedside nurse the patient's most recent platelet count. Confirmed with patient that they are not currently taking any anticoagulation, have any bleeding history or any family history of bleeding disorders. Patient expressed understanding and wished to proceed. All questions were answered. Sterile technique was used throughout the entire procedure. Please see nursing notes for vital signs. Test dose was given through epidural needle and negative prior to continuing to dose epidural or start infusion. Warning signs of high block given to the patient including shortness of breath, tingling/numbness in hands, complete motor block, or any concerning symptoms with instructions to call for help. Patient was given  instructions on fall risk and not to get out of bed. All questions and concerns addressed with instructions to call with any issues.     

## 2016-06-22 NOTE — Progress Notes (Signed)
Provider suggested to have patient labor down. RN talked with patient and family, pt agrees with plan of care.

## 2016-06-23 ENCOUNTER — Inpatient Hospital Stay (HOSPITAL_COMMUNITY): Admission: RE | Admit: 2016-06-23 | Payer: BLUE CROSS/BLUE SHIELD | Source: Ambulatory Visit

## 2016-06-23 NOTE — Lactation Note (Signed)
This note was copied from a baby's chart. Lactation Consultation Note  Patient Name: Megan Tran Today's Date: 06/23/2016 Reason for consult: Initial assessment Visited with this first time Mom, baby 3822 hrs old.  Mom states baby latched on right after birth, and has latched and fed 8 times so far.  Mom says her nipples are getting sore, and noted some pinkness but no skin trauma.  Demonstrated hand expression, and explained benefits of colostrum on nipples.  Mom lying on her side, and baby sleeping next to her.  Offered to undress baby and see if she will latch.  Recommended baby to be STS at breast.  Baby easily latched onto areola, with nice rhythmic sucking and swallowing.  Basics reviewed with Mom.  Encouraged STS and cue based feedings. Brochure left in room, informed of IP and OP lactation services available. Encouraged Mom to call for assistance as needed.  Lactation to follow up in am.   Consult Status Consult Status: Follow-up Date: 06/24/16 Follow-up type: In-patient    Megan Tran, Megan Tran 06/23/2016, 1:10 PM

## 2016-06-23 NOTE — Anesthesia Postprocedure Evaluation (Signed)
Anesthesia Post Note  Patient: Megan Tran  Procedure(s) Performed: * No procedures listed *  Patient location during evaluation: Mother Baby Anesthesia Type: Epidural Level of consciousness: awake and alert Pain management: pain level controlled Vital Signs Assessment: post-procedure vital signs reviewed and stable Respiratory status: spontaneous breathing, nonlabored ventilation and respiratory function stable Cardiovascular status: stable Postop Assessment: no headache, no backache and epidural receding Anesthetic complications: no        Last Vitals:  Vitals:   06/22/16 1930 06/22/16 2320  BP: 115/73 112/75  Pulse: 93 100  Resp: 20 20  Temp: 36.7 C 36.7 C    Last Pain:  Vitals:   06/23/16 0525  TempSrc:   PainSc: 6    Pain Goal: Patients Stated Pain Goal: 2 (06/22/16 0146)               Junious SilkGILBERT,Jhoana Upham

## 2016-06-23 NOTE — Progress Notes (Signed)
Post Partum Day#1 s/p NSVD with 4th degree tear Subjective: no complaints, up ad lib, voiding and tolerating PO  Objective: Blood pressure 112/75, pulse 100, temperature 98 F (36.7 C), temperature source Oral, resp. rate 20, height 5\' 5"  (1.651 m), weight 69.9 kg (154 lb), last menstrual period 09/18/2015, SpO2 98 %, unknown if currently breastfeeding.  Physical Exam:  General: alert Lochia: appropriate Uterine Fundus: firm and NT at U-1 DVT Evaluation: No evidence of DVT seen on physical exam.   Recent Labs  06/22/16 0102  HGB 13.9  HCT 40.5    Assessment/Plan: Plan for discharge tomorrow with percocet and stool softeners   LOS: 1 day   Shandon Matson C Dia Jefferys 06/23/2016, 6:59 AM

## 2016-06-24 MED ORDER — IBUPROFEN 600 MG PO TABS: 600.0000 mg | ORAL_TABLET | Freq: Four times a day (QID) | ORAL | 0 refills | Status: DC

## 2016-06-24 MED ORDER — OXYCODONE HCL 5 MG PO TABS: 5.0000 mg | ORAL_TABLET | ORAL | 0 refills | Status: DC | PRN

## 2016-06-24 NOTE — Lactation Note (Addendum)
This note was copied from a baby's chart. Lactation Consultation Note; Mom reports void this morning- no void for > 24 hours 5 stools per family yesterday. Mom latching baby to breast as I went into room in side lying position. Mom reports pain with initial latch that then eases off. Nipples tender but intact. Mom reports breasts are feeling a little fuller this morning and when she hand expresses it looks more whitish. Still nursing as I left room. Has Spectra pump for home.  Grand mother asking about milk storage and foods to eat. Reviewed quidelines with family  Reviewed our phone number, OP appointments and BFSG as resources for support after DC. No further questions at present. To call prn  Patient Name: Megan Tran [id number]'BToday's Date: 06/24/2016 Reason for consult: Follow-up assessment   Maternal Data Formula Feeding for Exclusion: No Has patient been taught Hand Expression?: Yes Does the patient have breastfeeding experience prior to this delivery?: No  Feeding Feeding Type: Breast Fed Length of feed: 5 min  LATCH Score/Interventions Latch: Grasps breast easily, tongue down, lips flanged, rhythmical sucking.  Audible Swallowing: A few with stimulation Intervention(s): Skin to skin;Hand expression Intervention(s): Skin to skin;Hand expression  Type of Nipple: Everted at rest and after stimulation  Comfort (Breast/Nipple): Filling, red/small blisters or bruises, mild/mod discomfort  Problem noted: Mild/Moderate discomfort Interventions (Mild/moderate discomfort):  (coconut oil)  Hold (Positioning): Assistance needed to correctly position infant at breast and maintain latch. Intervention(s): Breastfeeding basics reviewed;Position options  LATCH Score: 7  Lactation Tools Discussed/Used WIC Program: No   Consult Status Consult Status: Complete    Pamelia HoitWeeks, [name] 06/24/2016, 10:36 AM

## 2016-06-24 NOTE — Discharge Summary (Signed)
Obstetric Discharge Summary Reason for Admission: IOL at 41 weeks with pitocin Prenatal Procedures: ultrasound Intrapartum Procedures: spontaneous vaginal delivery Postpartum Procedures: none Complications-Operative and Postpartum: 4th degree perineal laceration Hemoglobin  Date Value Ref Range Status  06/22/2016 13.9 12.0 - 15.0 g/dL Final   HCT  Date Value Ref Range Status  06/22/2016 40.5 36.0 - 46.0 % Final   Hematocrit  Date Value Ref Range Status  03/17/2016 36.0 34.0 - 46.6 % Final    Physical Exam:  General: alert Lochia: appropriate Uterine Fundus: firm and NT at U-2 DVT Evaluation: No evidence of DVT seen on physical exam.  Discharge Diagnoses: Term Pregnancy-delivered  Discharge Information: Date: 06/24/2016 Activity: pelvic rest Diet: routine Medications: PNV, Ibuprofen and Percocet Condition: stable Instructions: refer to practice specific booklet Discharge to: home Follow-up Information    [facility]. Schedule an appointment as soon as possible for a visit in 6 week(s).   Why:  Tell them you want an IUD Contact information: [address] [place] [phone]          Newborn Data: Live born female  Birth Weight: 7 lb 14.5 oz (3585 g) APGAR: 8, 9  Home with mother.  Megan BossierMyra C Loreda Tran 06/24/2016, 7:13 AM

## 2016-06-24 NOTE — Discharge Instructions (Signed)

## 2016-06-24 NOTE — Addendum Note (Signed)
Addendum  created 06/24/16 1242 by Phillips GroutPeter Noura Purpura, MD   Anesthesia Staff edited

## 2016-06-26 NOTE — Addendum Note (Signed)
Addendum  created 06/26/16 2001 by Phillips GroutPeter Vashti Bolanos, MD   Anesthesia Staff edited

## 2016-07-02 ENCOUNTER — Telehealth: Payer: Self-pay | Admitting: Advanced Practice Midwife

## 2016-07-02 NOTE — Telephone Encounter (Signed)
Patient called stating she noticed a "greenish/yellow discharge" 1 time today. She is not running a fever or having any other complaints just concerned it could be infected. Advised patient that since it only occurred once, that is was probably not infected but continue to monitor. If fever develops or severe pain begins, to give us a call back. Pt verbalized understanding.

## 2016-07-03 ENCOUNTER — Telehealth: Payer: Self-pay | Admitting: *Deleted

## 2016-07-03 NOTE — Telephone Encounter (Signed)
Patient called stating she looked at her vaginal laceration and it looked "open". Pt states she is not bleeding or having any severe pain. Advised patient that I would expect if she was "open", she would be bleeding or having more severe pain. Encouraged patient to continue to monitor and if bleeding becomes heavy or severe pain develops, to give us a call back. Pt verbalized understanding.

## 2016-07-25 ENCOUNTER — Telehealth: Payer: Self-pay | Admitting: *Deleted

## 2016-07-28 NOTE — Telephone Encounter (Signed)
Pt called c/o noticing some blood when she has a BM. I asked pt if she had hemorrhoids, that sometimes that could cause her to see a little blood. She stated that she didn't see any but saw what looked to her like a raised vessel on the perineum. She stated that her appt was in 2 days and felt like she could wait until then to be seen. I spoke with a provider who agreed. Advised pt if bleeding increased or new symptoms arise to feel free to call us back. Pt verbalized understanding.

## 2016-07-30 ENCOUNTER — Encounter: Payer: Self-pay | Admitting: Advanced Practice Midwife

## 2016-07-30 ENCOUNTER — Ambulatory Visit (INDEPENDENT_AMBULATORY_CARE_PROVIDER_SITE_OTHER): Payer: BLUE CROSS/BLUE SHIELD | Admitting: Advanced Practice Midwife

## 2016-07-30 NOTE — Progress Notes (Signed)
Megan Tran is a 26 y.o. who presents for a postpartum visit. She is 5 weeks postpartum following a spontaneous vaginal delivery. I have fully reviewed the prenatal and intrapartum course. The delivery was at 40.1 gestational weeks. She had a 4th degree lac.  Feels fine.   Anesthesia: epidural. Postpartum course has been uneventful. Baby's course has been uneventful. Baby is feeding by bottle. Bleeding: staining only. Bowel function is normal. Bladder function is normal. Patient is not sexually active. Contraception method is IUD. Postpartum depression screening: negative.   Current Outpatient Prescriptions:  .  calcium carbonate (TUMS - DOSED IN MG ELEMENTAL CALCIUM) 500 MG chewable tablet, Chew 1 tablet by mouth daily., Disp: , Rfl:  .  ibuprofen (ADVIL,MOTRIN) 600 MG tablet, Take 1 tablet (600 mg total) by mouth every 6 (six) hours., Disp: 30 tablet, Rfl: 0 .  prenatal vitamin w/FE, FA (PRENATAL 1 + 1) 27-1 MG TABS tablet, Take 1 tablet by mouth daily at 12 noon. (Patient not taking: Reported on 07/30/2016), Disp: 30 each, Rfl: 12  Review of Systems   Constitutional: Negative for fever and chills Eyes: Negative for visual disturbances Respiratory: Negative for shortness of breath, dyspnea Cardiovascular: Negative for chest pain or palpitations  Gastrointestinal: Negative for vomiting, diarrhea and constipation Genitourinary: Negative for dysuria and urgency Musculoskeletal: Negative for back pain, joint pain, myalgias  Neurological: Negative for dizziness and headaches    Objective:     Vitals:   07/30/16 0937  BP: (!) 90/52  Pulse: 80   General:  alert, cooperative and no distress   Breasts:  negative  Lungs: clear to auscultation bilaterally  Heart:  regular rate and rhythm  Abdomen: Soft, nontender   Vulva:  normal  Vagina: 1/2cm granulation tissue in middle of perineum . Coexam w/JVF>  Sprayed w/hurricaine spray, trimmed tissue, AgNO3 applied  Cervix:  closed  Corpus:  Well involuted     Rectal Exam: no hemorrhoids, good sphincter tone        Assessment:    normal postpartum exam. Excess granulation tissue from 4th degree lac Plan:   1. Contraception: IUD 2. Follow up in: 1 week for IUD (no sex)  3.  Recheck perineum

## 2016-08-06 ENCOUNTER — Ambulatory Visit (INDEPENDENT_AMBULATORY_CARE_PROVIDER_SITE_OTHER): Payer: BLUE CROSS/BLUE SHIELD | Admitting: Obstetrics and Gynecology

## 2016-08-06 ENCOUNTER — Encounter: Payer: Self-pay | Admitting: Obstetrics and Gynecology

## 2016-08-06 VITALS — BP 100/62 | HR 76 | Ht 64.0 in | Wt 132.0 lb

## 2016-08-06 DIAGNOSIS — Z3202 Encounter for pregnancy test, result negative: Secondary | ICD-10-CM

## 2016-08-06 DIAGNOSIS — Z30011 Encounter for initial prescription of contraceptive pills: Secondary | ICD-10-CM | POA: Diagnosis not present

## 2016-08-06 LAB — POCT URINE PREGNANCY: Preg Test, Ur: NEGATIVE

## 2016-08-06 MED ORDER — LEVONORGEST-ETH ESTRAD 91-DAY 0.15-0.03 &0.01 MG PO TABS: 1.0000 | ORAL_TABLET | Freq: Every day | ORAL | 4 refills | Status: DC

## 2016-08-06 NOTE — Progress Notes (Signed)
   Family [facility] Clinic Visit  @            Patient name: Megan Tran MRN [id number]  Date of birth: 05-13-1990  CC & HPI:  Megan Tran is a 26 y.o. female presenting today to get a prescription for OCP. She is currently bottle feeding. She has used OCP in the past and notes she did well with it aside from having facial acne.   She had a 4th degree laceration during delivery and had granulation tissue at last visit. She was to be rechecked today however patient declines due to no pain or bleeding at this time.   ROS:  ROS -fever -pain  Pertinent History Reviewed:   Reviewed: Significant for  Medical         Past Medical History:  Diagnosis Date  . Contraceptive management 03/03/2014  . Medical history non-contributory   . Pregnant 10/19/2015  . Vaginal burning 11/16/2015  . Vaginal discharge during pregnancy in first trimester 11/16/2015  . Yeast infection 11/02/2012                              Surgical Hx:    Past Surgical History:  Procedure Laterality Date  . MOUTH SURGERY  Oct. 2012   Medications: Reviewed & Updated - see associated section                       Current Outpatient Prescriptions:  .  calcium carbonate (TUMS - DOSED IN MG ELEMENTAL CALCIUM) 500 MG chewable tablet, Chew 1 tablet by mouth daily., Disp: , Rfl:  .  ibuprofen (ADVIL,MOTRIN) 600 MG tablet, Take 1 tablet (600 mg total) by mouth every 6 (six) hours. (Patient not taking: Reported on 08/06/2016), Disp: 30 tablet, Rfl: 0 .  prenatal vitamin w/FE, FA (PRENATAL 1 + 1) 27-1 MG TABS tablet, Take 1 tablet by mouth daily at 12 noon. (Patient not taking: Reported on 07/30/2016), Disp: 30 each, Rfl: 12   Social History: Reviewed -  reports that she quit smoking about 10 months ago. Her smoking use included Cigarettes. She has quit using smokeless tobacco.  Objective Findings:  Vitals: Blood pressure 100/62, pulse 76, height  (1.626 m), weight 132 lb (59.9 kg), not currently  breastfeeding.  Physical Examination: General appearance - alert, well appearing, and in no distress Mental status - alert, oriented to person, place, and time Pelvic - examination declined by patient   Assessment & Plan:   A:  1. Contraceptive management 2 resolved granulation tissue at 4th degree laceration and repair site. P:  1. Start Seasonique, 3 month pack 2. Follow up PRN    By signing my name below, I, Sonum Patel, attest that this documentation has been prepared under the direction and in the presence of Tilda Burrow, MD. Electronically Signed: Sonum Patel, Neurosurgeon. 08/06/16. 11:57 AM.  I personally performed the services described in this documentation, which was SCRIBED in my presence. The recorded information has been reviewed and considered accurate. It has been edited as necessary during review. Tilda Burrow, MD

## 2016-08-11 ENCOUNTER — Telehealth: Payer: Self-pay | Admitting: Women's Health

## 2016-08-11 ENCOUNTER — Encounter: Payer: Self-pay | Admitting: *Deleted

## 2017-10-13 ENCOUNTER — Other Ambulatory Visit: Payer: Self-pay | Admitting: Obstetrics and Gynecology

## 2018-03-12 IMAGING — US US ABDOMEN LIMITED
1 series · 15 of 25 positions shown · non-contrast
Comparison: None.

CLINICAL DATA: Right-sided abdominal and rib cage pain for over a
month, the patient is 33 weeks pregnant

EXAM:
US ABDOMEN LIMITED - RIGHT UPPER QUADRANT

[Series 1: us abdomen limited · 15 of 32 slices shown]
[im 1/32]
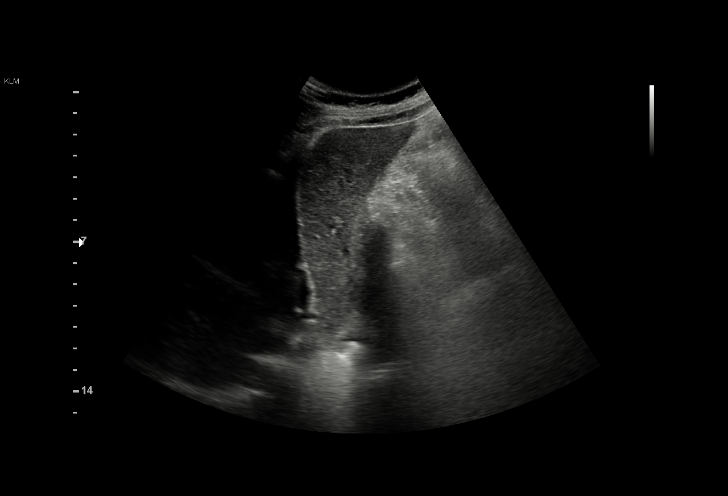
[im 3/32]
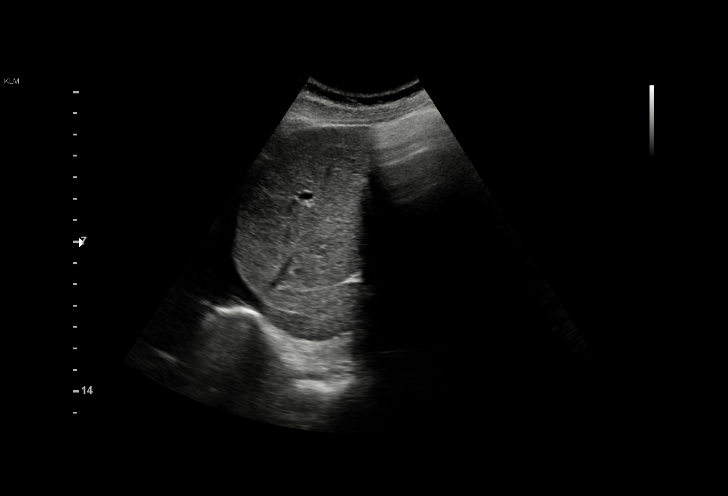
[im 6/32]
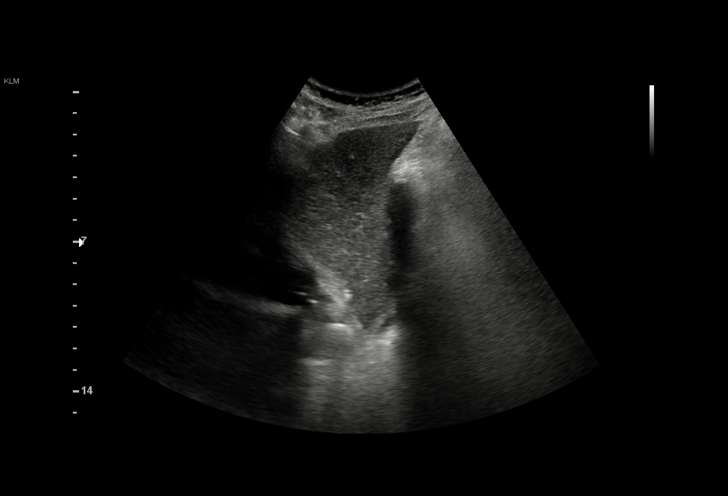
[im 7/32]
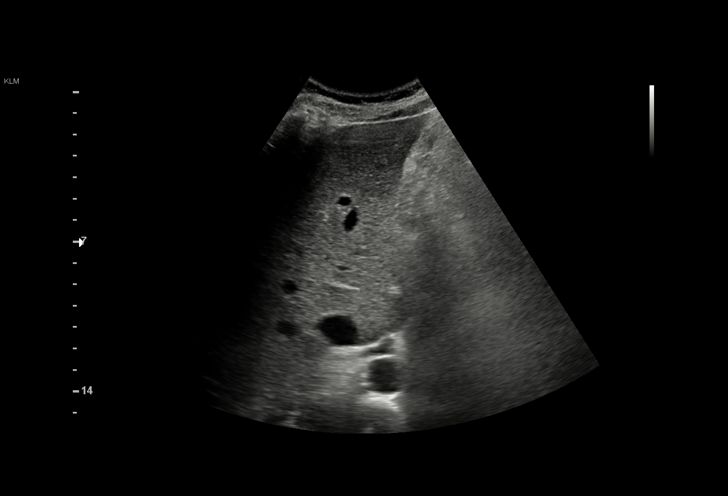
[im 10/32]
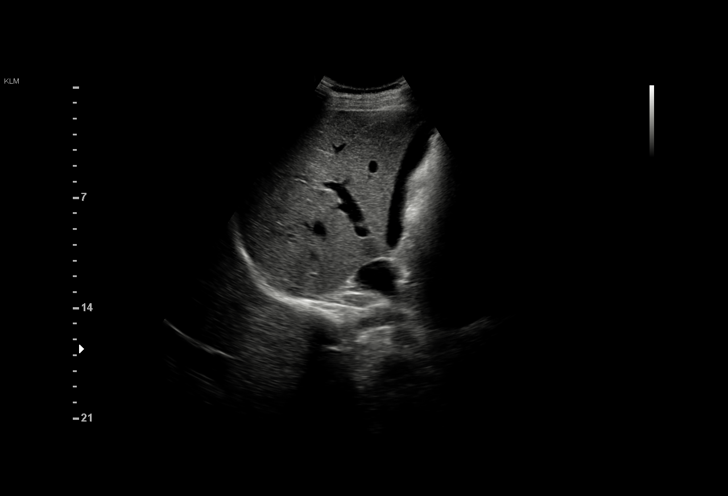
[im 12/32]
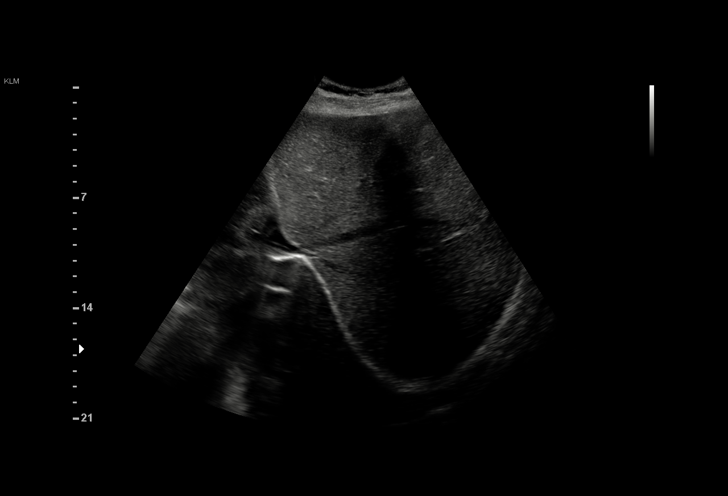
[im 13/32]
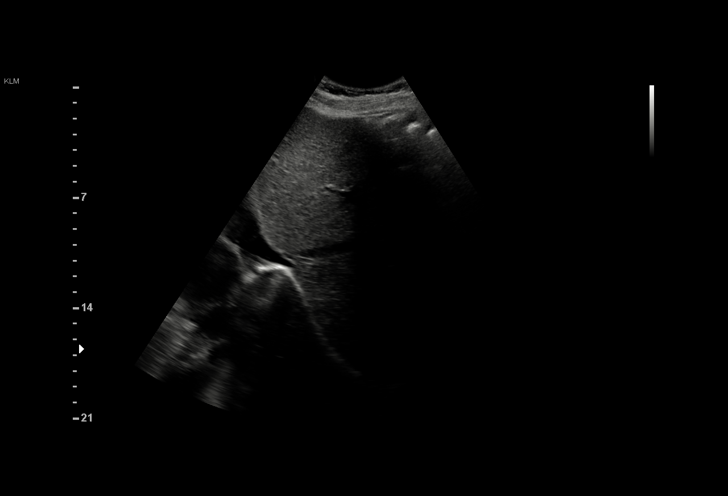
[im 16/32]
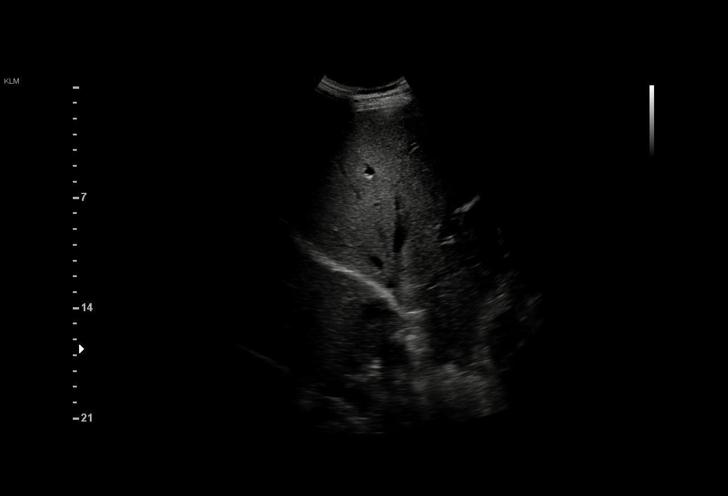
[im 19/32]
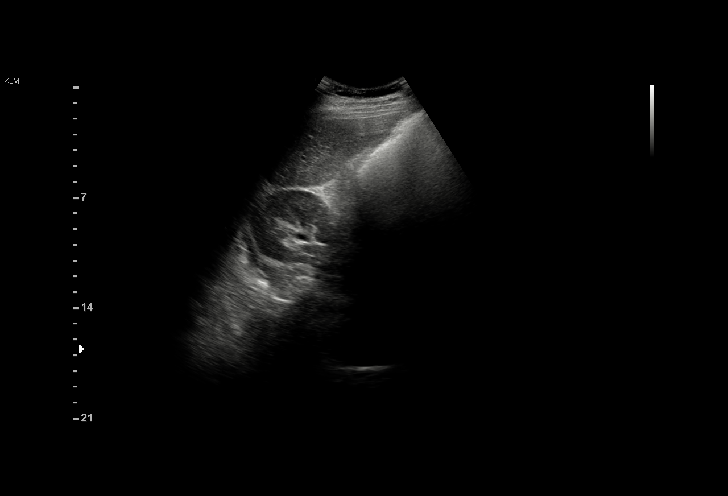
[im 20/32]
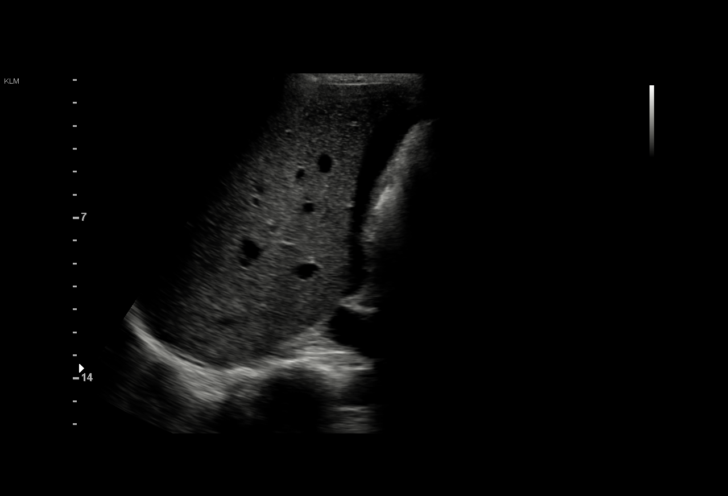
[im 22/32]
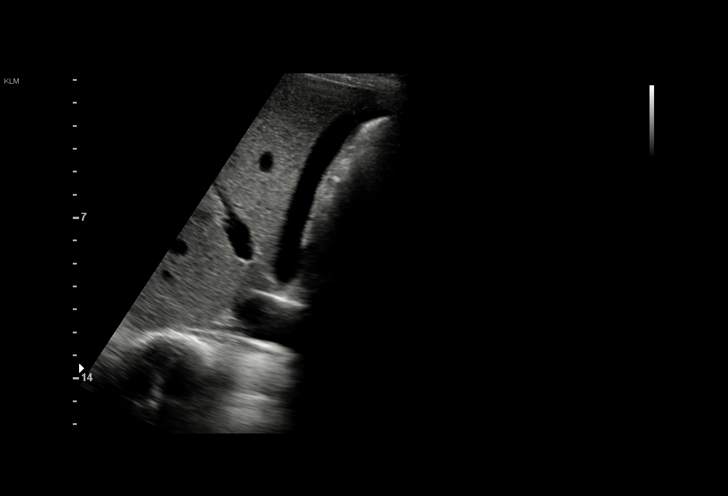
[im 25/32]
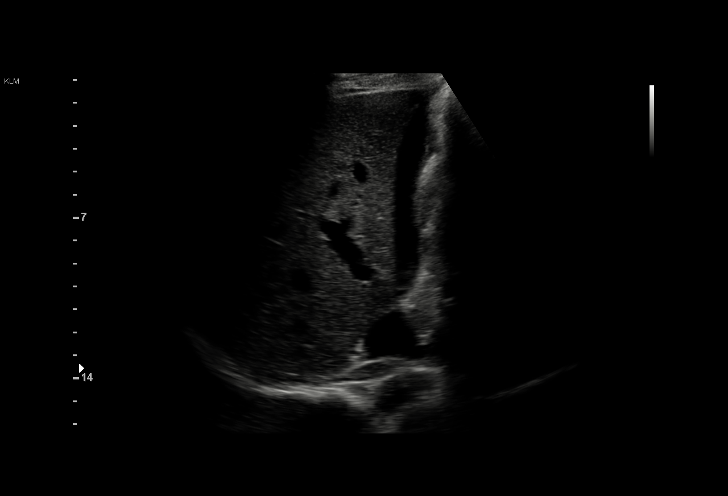
[im 26/32]
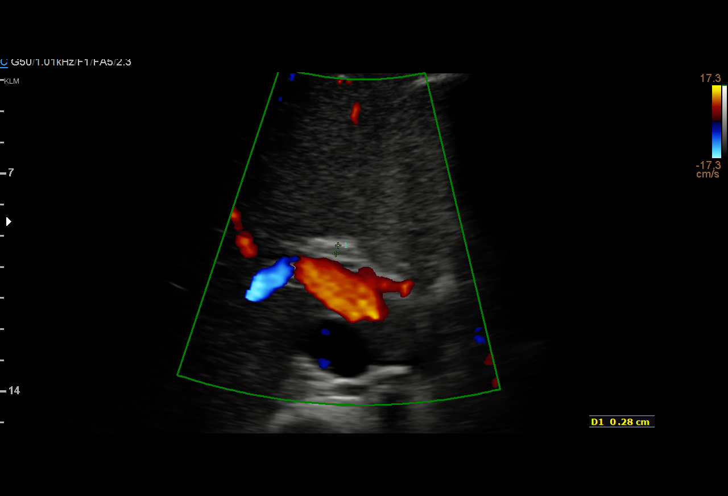
[im 29/32]
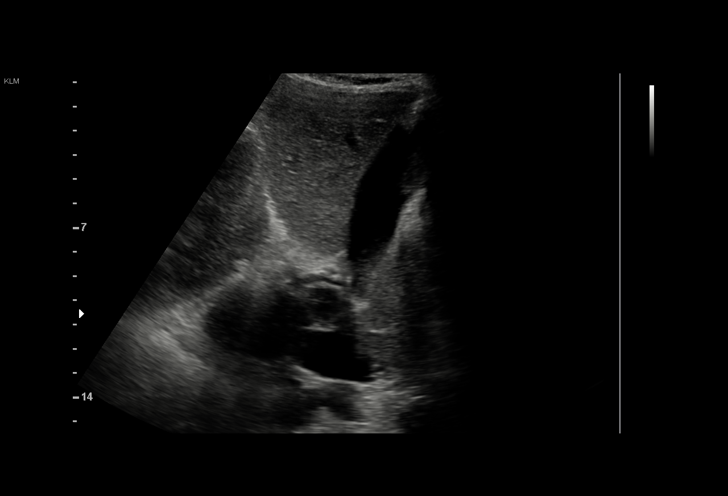
[im 32/32]
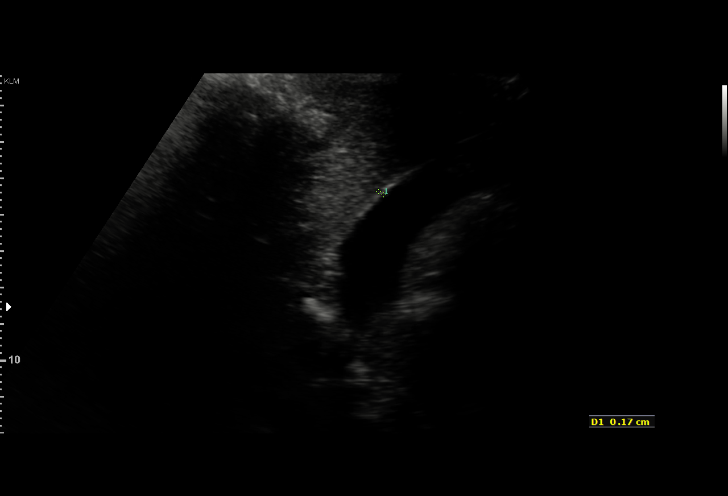

[15 of 25 positions shown; findings below may reference images not displayed]

FINDINGS: Gallbladder:

The gallbladder is visualized and no gallstones are noted. There is
no pain over the gallbladder with compression.

Common bile duct:

Diameter: The common bile duct is normal measuring 2.8 mm in
diameter.

Liver:

The liver has a normal echogenic pattern. No focal hepatic
abnormality is seen.
IMPRESSION: Negative limited ultrasound of the right upper quadrant.

## 2018-03-23 ENCOUNTER — Encounter: Payer: Self-pay | Admitting: Adult Health

## 2018-03-23 ENCOUNTER — Ambulatory Visit (INDEPENDENT_AMBULATORY_CARE_PROVIDER_SITE_OTHER): Payer: BLUE CROSS/BLUE SHIELD | Admitting: Adult Health

## 2018-03-23 VITALS — BP 114/71 | HR 84 | Ht 64.0 in | Wt 133.0 lb

## 2018-03-23 DIAGNOSIS — N644 Mastodynia: Secondary | ICD-10-CM

## 2018-03-23 DIAGNOSIS — N6323 Unspecified lump in the left breast, lower outer quadrant: Secondary | ICD-10-CM | POA: Insufficient documentation

## 2018-03-23 NOTE — Patient Instructions (Signed)
Breast Cyst A breast cyst is a sac in the breast that is filled with fluid. Breast cysts are usually noncancerous (benign). They are common among women, and they are most often located in the upper, outer portion of the breast. One or more cysts may develop. They form when fluid builds up inside of the breast glands. There are several types of breast cysts:  Macrocyst. This is a cyst that is about 2 inches (5.1 cm) across (in diameter).  Microcyst. This is a very small cyst that you cannot feel, but it can be seen with imaging tests such as an X-ray of the breast (mammogram) or ultrasound.  Galactocele. This is a cyst that contains milk. It may develop if you suddenly stop breastfeeding.  Breast cysts do not increase your risk of breast cancer. They usually disappear after menopause, unless you take artificial hormones (are on hormone therapy). What are the causes? The exact cause of breast cysts is not known. Possible causes include:  Blockage of tubes (ducts) in the breast glands, which leads to fluid buildup. Duct blockage may result from: ? Fibrocystic breast changes. This is a common, benign condition that occurs when women go through hormonal changes during the menstrual cycle. This is a common cause of multiple breast cysts. ? Overgrowth of breast tissue or breast glands. ? Scar tissue in the breast from previous surgery.  Changes in certain female hormones (estrogen and progesterone).  What increases the risk? You may be more likely to develop breast cysts if you have not gone through menopause. What are the signs or symptoms? Symptoms of a breast cyst may include:  Feeling one or more smooth, round, soft lumps (like grapes) in the breast that are easily moveable. The lump(s) may get bigger and more painful before your period and get smaller after your period.  Breast discomfort or pain.  How is this diagnosed? A cyst can be felt during a physical exam by your health care  provider. A mammogram and ultrasound will be done to confirm the diagnosis. Fluid may be removed from the cyst with a needle (fine-needle aspiration) and tested to make sure the cyst is not cancerous. How is this treated? Treatment may not be necessary. Your health care provider may monitor the cyst to see if it goes away on its own. If the cyst is uncomfortable or gets bigger, or if you do not like how the cyst makes your breast look, you may need treatment. Treatment may include:  Hormone treatment.  Fine-needle aspiration, to drain fluid from the cyst. There is a chance of the cyst coming back (recurring) after aspiration.  Surgery to remove the cyst.  Follow these instructions at home:  See your health care provider regularly. ? Get a yearly physical exam. ? If you are 20-40 years old, get a clinical breast exam every 1-3 years. After age 40, get this exam every year. ? Get mammograms as often as directed.  Do a breast self-exam every month, or as often as directed. Having many breast cysts, or "lumpy" breasts, may make it harder to feel for new lumps. Understand how your breasts normally look and feel, and write down any changes in your breasts so you can tell your health care provider about the changes. A breast self-exam involves: ? Comparing your breasts in the mirror. ? Looking for visible changes in your skin or nipples. ? Feeling for lumps or changes.  Take over-the-counter and prescription medicines only as told by your health care   provider.  Wear a supportive bra, especially when exercising.  Follow instructions from your health care provider about eating and drinking restrictions. ? Avoid caffeine. ? Cut down on salt (sodium) in what you eat and drink, especially before your menstrual period. Too much sodium can cause fluid buildup (retention), breast swelling, and discomfort.  Keep all follow-up visits as told your health care provider. This is important. Contact a  health care provider if:  You feel, or think you feel, a lump in your breast.  You notice that both breasts look or feel different than usual.  Your breast is still causing pain after your menstrual period is over.  You find new lumps or bumps that were not there before.  You feel lumps in your armpit (axilla). Get help right away if:  You have severe pain, tenderness, redness, or warmth in your breast.  You have fluid or blood leaking from your nipple.  Your breast lump becomes hard and painful.  You notice dimpling or wrinkling of the breast or nipple. This information is not intended to replace advice given to you by your health care provider. Make sure you discuss any questions you have with your health care provider. Document Released: 04/07/2005 Document Revised: 12/28/2015 Document Reviewed: 12/28/2015 Elsevier Interactive Patient Education  2017 Elsevier Inc.  
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
## 2018-03-30 ENCOUNTER — Ambulatory Visit (HOSPITAL_COMMUNITY): Payer: BLUE CROSS/BLUE SHIELD

## 2018-04-06 ENCOUNTER — Other Ambulatory Visit (HOSPITAL_COMMUNITY): Payer: BLUE CROSS/BLUE SHIELD

## 2018-04-19 ENCOUNTER — Ambulatory Visit (INDEPENDENT_AMBULATORY_CARE_PROVIDER_SITE_OTHER): Payer: BLUE CROSS/BLUE SHIELD | Admitting: Adult Health

## 2018-04-19 ENCOUNTER — Other Ambulatory Visit: Payer: Self-pay

## 2018-04-19 ENCOUNTER — Other Ambulatory Visit (HOSPITAL_COMMUNITY)
Admission: RE | Admit: 2018-04-19 | Discharge: 2018-04-19 | Disposition: A | Discharge status: Home / Self Care | Payer: BLUE CROSS/BLUE SHIELD | Source: Ambulatory Visit | Attending: Adult Health | Admitting: Adult Health

## 2018-04-19 ENCOUNTER — Encounter: Payer: Self-pay | Admitting: Adult Health

## 2018-04-19 VITALS — BP 124/79 | HR 83 | Resp 16 | Ht 64.0 in | Wt 132.0 lb

## 2018-04-19 DIAGNOSIS — Z01419 Encounter for gynecological examination (general) (routine) without abnormal findings: Secondary | ICD-10-CM | POA: Insufficient documentation

## 2018-04-19 DIAGNOSIS — N6323 Unspecified lump in the left breast, lower outer quadrant: Secondary | ICD-10-CM

## 2018-04-19 DIAGNOSIS — Z3009 Encounter for other general counseling and advice on contraception: Secondary | ICD-10-CM | POA: Diagnosis not present

## 2018-04-19 DIAGNOSIS — Z113 Encounter for screening for infections with a predominantly sexual mode of transmission: Secondary | ICD-10-CM | POA: Diagnosis not present

## 2018-04-19 DIAGNOSIS — Z3041 Encounter for surveillance of contraceptive pills: Secondary | ICD-10-CM | POA: Insufficient documentation

## 2018-04-19 MED ORDER — LEVONORGEST-ETH ESTRAD 91-DAY 0.15-0.03 &0.01 MG PO TABS: 1.0000 | ORAL_TABLET | Freq: Every day | ORAL | 4 refills | Status: DC

## 2018-04-19 NOTE — Progress Notes (Signed)
Patient ID: Megan Tran, female   DOB: 06/30/1990, 27 y.o.   MRN: [id number] History of Present Illness: Megan Tran is a 27 year old white female, single, G1P1 in for well woman gyn exam and pap. No PCP.   Current Medications, Allergies, Past Medical History, Past Surgical History, Family History and Social History were reviewed in [facility] electronic medical record.     Review of Systems: Patient denies any headaches, hearing loss, fatigue, blurred vision, shortness of breath, chest pain, abdominal pain, problems with bowel movements, urination, or intercourse. No joint pain or mood swings. Has not has US on left breast yet, it is tomorrow,(she was sick and rescheduled it) has had some pain radiating to left shoulder and chest hurts on left, too.  She is happy with her OCs.     Physical Exam:BP 124/79 (BP Location: Right Arm, Patient Position: Sitting, Cuff Size: Normal)   Pulse 83   Resp 16   Ht 5\' 4"  (1.626 m)   Wt 132 lb (59.9 kg)   LMP 04/14/2018   BMI 22.66 kg/m  General:  Well developed, well nourished, no acute distress Skin:  Warm and dry Neck:  Midline trachea, normal thyroid, good ROM, no lymphadenopathy Lungs; Clear to auscultation bilaterally Breast:  No dominant palpable mass, retraction, or nipple discharge on the right , on the left, no retraction or nipple discharge, has pea sized mass at 3 o'clock 1 FB from areola  Cardiovascular: Regular rate and rhythm Abdomen:  Soft, non tender, no hepatosplenomegaly Pelvic:  External genitalia is normal in appearance, no lesions.  The vagina is normal in appearance. Urethra has no lesions or masses. The cervix is everted at os, pap with GC/CHL performed.  Uterus is felt to be normal size, shape, and contour.  No adnexal masses or tenderness noted.Bladder is non tender, no masses felt. Extremities/musculoskeletal:  No swelling or varicosities noted, no clubbing or cyanosis Psych:  No mood changes, alert and  cooperative,seems happy PHQ 2 score 0. Fall risk is low. Examination chaperoned by Francene FindersKim Lancaster RN.   Impression: 1. Encounter for gynecological examination with Papanicolaou smear of cervix   2. Family planning   3. Encounter for surveillance of contraceptive pills   4. Screening examination for STD (sexually transmitted disease)   5. Mass of lower outer quadrant of left breast       Plan: Check HIV and RPR Left breast [facility] 12/[facility] 3:30 [facility] Physical in 1 year Pap in 3 if normal  Meds ordered this encounter  Medications  . Levonorgestrel-Ethinyl Estradiol (ASHLYNA) 0.15-0.03 &0.01 MG tablet    Sig: Take 1 tablet by mouth daily.    Dispense:  91 tablet    Refill:  4    Order Specific Question:   Supervising Provider    Answer:   Duane LopeEURE, LUTHER H [[id number]]

## 2018-04-20 ENCOUNTER — Ambulatory Visit (HOSPITAL_COMMUNITY)
Admission: RE | Admit: 2018-04-20 | Discharge: 2018-04-20 | Disposition: A | Discharge status: Home / Self Care | Payer: BLUE CROSS/BLUE SHIELD | Source: Ambulatory Visit | Attending: Adult Health | Admitting: Adult Health

## 2018-04-20 DIAGNOSIS — N6323 Unspecified lump in the left breast, lower outer quadrant: Secondary | ICD-10-CM | POA: Insufficient documentation

## 2018-04-20 LAB — RPR: RPR Ser Ql: NONREACTIVE

## 2018-04-20 LAB — HIV ANTIBODY (ROUTINE TESTING W REFLEX): HIV Screen 4th Generation wRfx: NONREACTIVE

## 2018-04-23 LAB — CYTOLOGY - PAP
CHLAMYDIA, DNA PROBE: NEGATIVE
DIAGNOSIS: NEGATIVE
Neisseria Gonorrhea: NEGATIVE

## 2018-06-15 ENCOUNTER — Other Ambulatory Visit: Payer: Self-pay

## 2018-06-15 ENCOUNTER — Emergency Department (HOSPITAL_COMMUNITY)
Admission: EM | Admit: 2018-06-15 | Discharge: 2018-06-15 | Disposition: A | Discharge status: Home / Self Care | Payer: BLUE CROSS/BLUE SHIELD | Attending: Emergency Medicine | Admitting: Emergency Medicine

## 2018-06-15 ENCOUNTER — Encounter (HOSPITAL_COMMUNITY): Payer: Self-pay | Admitting: Emergency Medicine

## 2018-06-15 DIAGNOSIS — R0600 Dyspnea, unspecified: Secondary | ICD-10-CM | POA: Insufficient documentation

## 2018-06-15 DIAGNOSIS — Z87891 Personal history of nicotine dependence: Secondary | ICD-10-CM | POA: Insufficient documentation

## 2018-06-15 DIAGNOSIS — R002 Palpitations: Secondary | ICD-10-CM | POA: Insufficient documentation

## 2018-06-15 DIAGNOSIS — R0789 Other chest pain: Secondary | ICD-10-CM | POA: Diagnosis not present

## 2018-06-15 LAB — BASIC METABOLIC PANEL
Anion gap: 9 (ref 5–15)
BUN: 10 mg/dL (ref 6–20)
CO2: 25 mmol/L (ref 22–32)
CREATININE: 0.56 mg/dL (ref 0.44–1.00)
Calcium: 9.4 mg/dL (ref 8.9–10.3)
Chloride: 103 mmol/L (ref 98–111)
GFR calc Af Amer: >60 mL/min (ref >60)
GFR calc non Af Amer: >60 mL/min (ref >60)
Glucose, Bld: 85 mg/dL (ref 70–99)
Potassium: 3.7 mmol/L (ref 3.5–5.1)
Sodium: 137 mmol/L (ref 135–145)

## 2018-06-15 LAB — TROPONIN I: Troponin I: <0.03 ng/mL (ref <0.03)

## 2018-06-15 LAB — TSH: TSH: 2.158 u[IU]/mL (ref 0.350–4.500)

## 2018-06-15 LAB — CBC WITH DIFFERENTIAL/PLATELET
Abs Immature Granulocytes: 0.02 10*3/uL (ref 0.00–0.07)
Basophils Absolute: 0.1 10*3/uL (ref 0.0–0.1)
Basophils Relative: 1 %
Eosinophils Absolute: 0.2 10*3/uL (ref 0.0–0.5)
Eosinophils Relative: 2 %
HCT: 45.7 % (ref 36.0–46.0)
Hemoglobin: 14.4 g/dL (ref 12.0–15.0)
Immature Granulocytes: 0 %
Lymphocytes Relative: 34 %
Lymphs Abs: 2.8 10*3/uL (ref 0.7–4.0)
MCH: 27.9 pg (ref 26.0–34.0)
MCHC: 31.5 g/dL (ref 30.0–36.0)
MCV: 88.6 fL (ref 80.0–100.0)
MONOS PCT: 6 %
Monocytes Absolute: 0.5 10*3/uL (ref 0.1–1.0)
Neutro Abs: 4.6 10*3/uL (ref 1.7–7.7)
Neutrophils Relative %: 57 %
Platelets: 335 10*3/uL (ref 150–400)
RBC: 5.16 MIL/uL — ABNORMAL HIGH (ref 3.87–5.11)
RDW: 13 % (ref 11.5–15.5)
WBC: 8.2 10*3/uL (ref 4.0–10.5)
nRBC: 0 % (ref 0.0–0.2)

## 2018-06-15 LAB — PREGNANCY, URINE: Preg Test, Ur: NEGATIVE

## 2018-06-15 LAB — MAGNESIUM: Magnesium: 2.1 mg/dL (ref 1.7–2.4)

## 2018-06-15 LAB — D-DIMER, QUANTITATIVE: D-Dimer, Quant: <0.27 ug/mL-FEU (ref 0.00–0.50)

## 2018-06-15 MED ORDER — SODIUM CHLORIDE 0.9 % IV BOLUS
1000.0000 mL | Freq: Once | INTRAVENOUS | Status: AC
  Administered 2018-06-15: 1000 mL via INTRAVENOUS

## 2018-06-15 NOTE — ED Notes (Signed)
Pt states she went to her PCP and had an EKG done, the PCP told her everything looked good; pt states she still is feeling the palpitations and she has cut out caffeine

## 2018-06-15 NOTE — ED Provider Notes (Signed)
Emergency Department Provider Note   I have reviewed the triage vital signs and the nursing notes.   HISTORY  Chief Complaint Palpitations   HPI Megan Tran is a 28 y.o. female with PMH of palpitations presents to the emergency department for evaluation of intermittent palpitations over the past week.  She has seen her primary care physician who performed an EKG which was normal.  She was encouraged to increase her fluid intake and has cut out caffeine entirely but continues to have palpitation symptoms.  She states the palpitation improved in the evening but are worse during the day.  She does have intermittent episodes of dyspnea and focal, left-sided, anterior chest discomfort.  She denies calling of pain but states it is a tightness and seems localized over the left chest.  No active chest pain or shortness of breath.  She is not currently feeling heart palpitations.  She denies taking any herbal supplements or other prescription medications other than oral contraception.   Past Medical History:  Diagnosis Date  . Contraceptive management 03/03/2014  . Medical history non-contributory   . Pregnant 10/19/2015  . Vaginal burning 11/16/2015  . Vaginal discharge during pregnancy in first trimester 11/16/2015  . Yeast infection 11/02/2012    Patient Active Problem List   Diagnosis Date Noted  . Encounter for surveillance of contraceptive pills 04/19/2018  . Screening examination for STD (sexually transmitted disease) 04/19/2018  . Family planning 04/19/2018  . Encounter for gynecological examination with Papanicolaou smear of cervix 04/19/2018  . Breast tenderness 03/23/2018  . Mass of lower outer quadrant of left breast 03/23/2018  . Normal labor 06/22/2016  . Encounter for other contraceptive management 03/03/2014    Past Surgical History:  Procedure Laterality Date  . MOUTH SURGERY  Oct. 2012   Allergies Cefdinir  Family History  Problem Relation Age of Onset  .  Hypertension Maternal Grandmother   . Hypertension Maternal Grandfather   . Heart disease Maternal Grandfather     Social History Social History   Tobacco Use  . Smoking status: Former Smoker    Types: Cigarettes    Last attempt to quit: 09/24/2015    Years since quitting: 2.7  . Smokeless tobacco: Former Engineer, water Use Topics  . Alcohol use: No    Alcohol/week: 0.0 standard drinks    Comment: occassional; not now  . Drug use: No    Review of Systems  Constitutional: No fever/chills Eyes: No visual changes. ENT: No sore throat. Cardiovascular: Positive intermittent chest pain. Positive heart palpitations.  Respiratory: Positive intermittent shortness of breath. Gastrointestinal: No abdominal pain.  No nausea, no vomiting.  No diarrhea.  No constipation. Genitourinary: Negative for dysuria. Musculoskeletal: Negative for back pain. Skin: Negative for rash. Neurological: Negative for headaches, focal weakness or numbness.  10-point ROS otherwise negative.  ____________________________________________   PHYSICAL EXAM:  VITAL SIGNS: ED Triage Vitals  Enc Vitals Group     BP 06/15/18 1853 115/68     Pulse Rate 06/15/18 1853 97     Resp 06/15/18 1853 16     Temp 06/15/18 1853 98.5 F (36.9 C)     Temp Source 06/15/18 1853 Oral     SpO2 06/15/18 1853 100 %     Weight 06/15/18 1853 131 lb (59.4 kg)     Height 06/15/18 1853 5\' 5"  (1.651 m)   Constitutional: Alert and oriented. Well appearing and in no acute distress. Eyes: Conjunctivae are normal. Head: Atraumatic. Nose: No congestion/rhinnorhea. Mouth/Throat:  Mucous membranes are moist. Neck: No stridor.   Cardiovascular: Normal rate, regular rhythm. Good peripheral circulation. Grossly normal heart sounds.   Respiratory: Normal respiratory effort.  No retractions. Lungs CTAB. Gastrointestinal: Soft and nontender. No distention.  Musculoskeletal: No lower extremity tenderness nor edema. No gross deformities of  extremities. Neurologic:  Normal speech and language. No gross focal neurologic deficits are appreciated.  Skin:  Skin is warm, dry and intact. No rash noted.  ____________________________________________   LABS (all labs ordered are listed, but only abnormal results are displayed)  Labs Reviewed  CBC WITH DIFFERENTIAL/PLATELET - Abnormal; Notable for the following components:      Result Value   RBC 5.16 (*)    All other components within normal limits  BASIC METABOLIC PANEL  MAGNESIUM  TSH  D-DIMER, QUANTITATIVE (NOT AT [facility])  TROPONIN I  PREGNANCY, URINE   ____________________________________________  EKG   EKG Interpretation  Date/Time:  Tuesday June 15 2018 23:28:47 EST Ventricular Rate:  84 PR Interval:    QRS Duration: 87 QT Interval:  343 QTC Calculation: 406 R Axis:   94 Text Interpretation:  Sinus rhythm Ventricular premature complex Borderline right axis deviation No STEMI  Confirmed by Alona Bene [phone]) on 06/15/2018 11:40:37 PM       ____________________________________________  RADIOLOGY  None ____________________________________________   PROCEDURES  Procedure(s) performed:   Procedures  None ____________________________________________   INITIAL IMPRESSION / ASSESSMENT AND PLAN / ED COURSE  Pertinent labs & imaging results that were available during my care of the patient were reviewed by me and considered in my medical decision making (see chart for details).  Patient presents to the emergency department for evaluation of heart palpitations.  No active palpitations but while was examining the patient she had a PVC on monitor.  She confirmed that this was the sensation she is feeling but states that it is often much more persistent and prolonged.  Plan for screening labs including electrolytes.  Patient is describing some anterior chest discomfort as well as occasional dyspnea.  She has borderline tachycardia here and increased risk  for PE with her oral contraception.  I will obtain d-dimer and troponin.   Additional lab work reviewed with no acute findings.  I encouraged the patient to call her PCP for further follow-up but also provided contact information for local cardiology for follow-up.  Patient is having occasional PVCs on monitor but no runs of bigeminy or more frequent PVCs.  Encourage the patient to continue to abstain from caffeine.  Discussed emergency department return precautions in detail. ____________________________________________  FINAL CLINICAL IMPRESSION(S) / ED DIAGNOSES  Final diagnoses:  Palpitations     MEDICATIONS GIVEN DURING THIS VISIT:  Medications  sodium chloride 0.9 % bolus 1,000 mL (0 mLs Intravenous Stopped 06/15/18 2348)    Note:  This document was prepared using Dragon voice recognition software and may include unintentional dictation errors.  Alona Bene, MD Emergency Medicine    Nafis Farnan, Arlyss Repress, MD 06/16/18 [phone]

## 2018-06-15 NOTE — ED Triage Notes (Signed)
Pt reports intermittent palpitations for over a week. Pt states that she saw PCP, EKG was performed and everything turned out normal. Pt thought it was due to caffeine so she cut it out. She reports this episode began last night with no caffeine intake.

## 2018-06-15 NOTE — Discharge Instructions (Signed)
Continue to hydrate at home and follow up with your PCP. I have listed the name of a Cardiologist to call for follow up. Return to the ED with any new or worsening symptoms.

## 2018-06-29 ENCOUNTER — Ambulatory Visit (INDEPENDENT_AMBULATORY_CARE_PROVIDER_SITE_OTHER): Payer: BLUE CROSS/BLUE SHIELD | Admitting: Cardiology

## 2018-06-29 ENCOUNTER — Encounter: Payer: Self-pay | Admitting: Cardiology

## 2018-06-29 ENCOUNTER — Ambulatory Visit: Payer: BLUE CROSS/BLUE SHIELD

## 2018-06-29 VITALS — BP 88/56 | HR 90 | Ht 65.0 in | Wt 130.0 lb

## 2018-06-29 DIAGNOSIS — R002 Palpitations: Secondary | ICD-10-CM

## 2018-06-29 NOTE — Progress Notes (Signed)
Clinical Summary Megan Tran is a 28 y.o.female seen as new consult, referred by    1. Palpitations - feeling of heart racing and skipping started in early Feb - last all day. Worst with activity. Can feel lightheaded, SOB. Left sided pain.  - she has cut off caffeine with mild improvement.  - seen in ER 05/2018 with palpitations - Trop neg, Ddimer neg, TSH 2.1 Mg 2.1 K 3.7. EKG normal sinus rhythm. From ER note she had one symptomatic PVC during stay  - walks regularly at work without exertional symptoms     SH: works at [employer]. Daughter 24 year old.    Past Medical History:  Diagnosis Date  . Contraceptive management 03/03/2014  . Medical history non-contributory   . Pregnant 10/19/2015  . Vaginal burning 11/16/2015  . Vaginal discharge during pregnancy in first trimester 11/16/2015  . Yeast infection 11/02/2012     Allergies  Allergen Reactions  . Cefdinir Hives and Other (See Comments)    Tight in throat     Current Outpatient Medications  Medication Sig Dispense Refill  . Levonorgestrel-Ethinyl Estradiol (ASHLYNA) 0.15-0.03 &0.01 MG tablet Take 1 tablet by mouth daily. 91 tablet 4   No current facility-administered medications for this visit.      Past Surgical History:  Procedure Laterality Date  . MOUTH SURGERY  Oct. 2012     Allergies  Allergen Reactions  . Cefdinir Hives and Other (See Comments)    Tight in throat      Family History  Problem Relation Age of Onset  . Hypertension Maternal Grandmother   . Hypertension Maternal Grandfather   . Heart disease Maternal Grandfather      Social History Megan Tran reports that she quit smoking about 2 years ago. Her smoking use included cigarettes. She has quit using smokeless tobacco. Megan Tran reports no history of alcohol use.   Review of Systems CONSTITUTIONAL: No weight loss, fever, chills, weakness or fatigue.  HEENT: Eyes: No visual loss, blurred vision, double vision or yellow  sclerae.No hearing loss, sneezing, congestion, runny nose or sore throat.  SKIN: No rash or itching.  CARDIOVASCULAR: per hpi RESPIRATORY: per hpi GASTROINTESTINAL: No anorexia, nausea, vomiting or diarrhea. No abdominal pain or blood.  GENITOURINARY: No burning on urination, no polyuria NEUROLOGICAL: No headache, dizziness, syncope, paralysis, ataxia, numbness or tingling in the extremities. No change in bowel or bladder control.  MUSCULOSKELETAL: No muscle, back pain, joint pain or stiffness.  LYMPHATICS: No enlarged nodes. No history of splenectomy.  PSYCHIATRIC: No history of depression or anxiety.  ENDOCRINOLOGIC: No reports of sweating, cold or heat intolerance. No polyuria or polydipsia.  Marland Kitchen   Physical Examination Today's Vitals   06/29/18 0831 06/29/18 0833  BP: (!) 92/50 (!) 88/56  Pulse: 90   SpO2: 98%   Weight: 130 lb (59 kg)   Height: 5\' 5"  (1.651 m)    Body mass index is 21.63 kg/m.  Gen: resting comfortably, no acute distress HEENT: no scleral icterus, pupils equal round and reactive, no palptable cervical adenopathy,  CV: RRR, no m/r/g, no jvd Resp: Clear to auscultation bilaterally GI: abdomen is soft, non-tender, non-distended, normal bowel sounds, no hepatosplenomegaly MSK: extremities are warm, no edema.  Skin: warm, no rash Neuro:  no focal deficits Psych: appropriate affect    Assessment and Plan   1. Palpitations - baseline EKG is normal. Normal TSH, Mg,K - we will obtain a 2 week event monitor to further evaluate.  2. Low bp - repeat manual bp is 105/60, suspect dynamap initially check was inaccurate.   F/u pending monitor results   Antoine Poche, M.D.

## 2018-06-29 NOTE — Patient Instructions (Signed)
Medication Instructions:  Your physician recommends that you continue on your current medications as directed. Please refer to the Current Medication list given to you today.   Labwork: NONE  Testing/Procedures: Your physician has recommended that you wear an event monitor. Event monitors are medical devices that record the heart's electrical activity. Doctors most often [place] these monitors to diagnose arrhythmias. Arrhythmias are problems with the speed or rhythm of the heartbeat. The monitor is a small, portable device. You can wear one while you do your normal daily activities. This is usually used to diagnose what is causing palpitations/syncope (passing out).    Follow-Up: Your physician recommends that you schedule a follow-up appointment: PENDING EVENT MONITOR RESULTS    Any Other Special Instructions Will Be Listed Below (If Applicable).     If you need a refill on your cardiac medications before your next appointment, please call your pharmacy.

## 2018-12-09 ENCOUNTER — Telehealth: Payer: Self-pay | Admitting: Women's Health

## 2018-12-09 NOTE — Telephone Encounter (Signed)
Pt is wanting STD testing. Requesting to have blood work drawn as well.

## 2018-12-09 NOTE — Telephone Encounter (Signed)
Message routed to [place] to schedule pt for self swab and on the lab schedule. [place]

## 2018-12-10 ENCOUNTER — Other Ambulatory Visit: Payer: BC Managed Care – PPO

## 2018-12-10 ENCOUNTER — Other Ambulatory Visit: Payer: Self-pay

## 2018-12-10 DIAGNOSIS — Z113 Encounter for screening for infections with a predominantly sexual mode of transmission: Secondary | ICD-10-CM

## 2018-12-11 LAB — HIV ANTIBODY (ROUTINE TESTING W REFLEX): HIV Screen 4th Generation wRfx: NONREACTIVE

## 2018-12-11 LAB — RPR: RPR Ser Ql: NONREACTIVE

## 2018-12-14 LAB — NUSWAB VAGINITIS PLUS (VG+)
Candida albicans, NAA: NEGATIVE
Candida glabrata, NAA: NEGATIVE
Chlamydia trachomatis, NAA: NEGATIVE
Neisseria gonorrhoeae, NAA: NEGATIVE
Trich vag by NAA: NEGATIVE

## 2019-05-03 ENCOUNTER — Ambulatory Visit: Payer: BC Managed Care – PPO | Attending: Internal Medicine

## 2019-05-03 DIAGNOSIS — Z20822 Contact with and (suspected) exposure to covid-19: Secondary | ICD-10-CM

## 2019-05-04 ENCOUNTER — Other Ambulatory Visit: Payer: BC Managed Care – PPO

## 2019-05-05 LAB — NOVEL CORONAVIRUS, NAA: SARS-CoV-2, NAA: NOT DETECTED

## 2019-07-14 ENCOUNTER — Other Ambulatory Visit: Payer: Self-pay | Admitting: Adult Health

## 2019-12-17 ENCOUNTER — Encounter: Payer: Self-pay | Admitting: Emergency Medicine

## 2019-12-17 ENCOUNTER — Other Ambulatory Visit: Payer: Self-pay

## 2019-12-17 ENCOUNTER — Ambulatory Visit
Admission: EM | Admit: 2019-12-17 | Discharge: 2019-12-17 | Disposition: A | Discharge status: Home / Self Care | Payer: BC Managed Care – PPO | Attending: Emergency Medicine | Admitting: Emergency Medicine

## 2019-12-17 DIAGNOSIS — Z1152 Encounter for screening for COVID-19: Secondary | ICD-10-CM

## 2019-12-17 DIAGNOSIS — J320 Chronic maxillary sinusitis: Secondary | ICD-10-CM

## 2019-12-17 DIAGNOSIS — J069 Acute upper respiratory infection, unspecified: Secondary | ICD-10-CM | POA: Diagnosis not present

## 2019-12-17 MED ORDER — BENZONATATE 100 MG PO CAPS: 100.0000 mg | ORAL_CAPSULE | Freq: Three times a day (TID) | ORAL | 0 refills | Status: DC

## 2019-12-17 MED ORDER — PREDNISONE 10 MG PO TABS: 20.0000 mg | ORAL_TABLET | Freq: Every day | ORAL | 0 refills | Status: DC

## 2019-12-17 MED ORDER — MONTELUKAST SODIUM 4 MG PO CHEW: 4.0000 mg | CHEWABLE_TABLET | Freq: Every day | ORAL | 0 refills | Status: DC

## 2019-12-17 MED ORDER — DEXAMETHASONE SODIUM PHOSPHATE 10 MG/ML IJ SOLN
10.0000 mg | Freq: Once | INTRAMUSCULAR | Status: AC
  Administered 2019-12-17: 10 mg via INTRAMUSCULAR

## 2019-12-17 NOTE — Discharge Instructions (Addendum)
COVID testing ordered.  It will take between 2-7 days for test results.  Someone will contact you regarding abnormal results.    In the meantime: You should remain isolated in your home for 10 days from symptom onset AND greater than 24 hours after symptoms resolution (absence of fever without the use of fever-reducing medication and improvement in respiratory symptoms), whichever is longer Get plenty of rest and push fluids Tessalon Perles prescribed for cough Low-dose prednisone prescribed Use medications daily for symptom relief Use OTC medications like ibuprofen or tylenol as needed fever or pain Call or go to the ED if you have any new or worsening symptoms such as fever, worsening cough, shortness of breath, chest tightness, chest pain, turning blue, changes in mental status, etc..Marland Kitchen

## 2019-12-17 NOTE — ED Provider Notes (Signed)
[facility]   [id number] 12/17/19 Arrival Time: 1411   CC: COVID symptoms  SUBJECTIVE: History from: patient.  Megan Tran is a 29 y.o. female who presents to the urgent care for complaint of chills and fever, sinus pressure, nasdal congestion, cough and body ache that started this past Thursday.  Denies sick exposure to COVID, flu or strep.  Denies recent travel.  Has tried OTC medication without relief.  Denies aggravating factors.  Denies previous symptoms in the past.   Denies fever, chills, fatigue, sinus pain, rhinorrhea, sore throat, SOB, wheezing, chest pain, nausea, changes in bowel or bladder habits.     ROS: As per HPI.  All other pertinent ROS negative.      Past Medical History:  Diagnosis Date  . Contraceptive management 03/03/2014  . Medical history non-contributory   . Pregnant 10/19/2015  . Vaginal burning 11/16/2015  . Vaginal discharge during pregnancy in first trimester 11/16/2015  . Yeast infection 11/02/2012   Past Surgical History:  Procedure Laterality Date  . MOUTH SURGERY  Oct. 2012   Allergies  Allergen Reactions  . Cefdinir Hives and Other (See Comments)    Tight in throat   No current facility-administered medications on file prior to encounter.   Current Outpatient Medications on File Prior to Encounter  Medication Sig Dispense Refill  . AMETHIA 0.15-0.03 &0.01 MG tablet TAKE 1 TABLET BY MOUTH DAILY 91 tablet 4   Social History   Socioeconomic History  . Marital status: Single    Spouse name: Not on file  . Number of children: Not on file  . Years of education: Not on file  . Highest education level: Not on file  Occupational History  . Not on file  Tobacco Use  . Smoking status: Former Smoker    Types: Cigarettes    Quit date: 09/24/2015    Years since quitting: 4.2  . Smokeless tobacco: Former Clinical biochemist  . Vaping Use: Never used  Substance and Sexual Activity  . Alcohol use: No    Alcohol/week: 0.0  standard drinks    Comment: occassional; not now  . Drug use: No  . Sexual activity: Not Currently    Birth control/protection: Pill  Other Topics Concern  . Not on file  Social History Narrative  . Not on file   Social Determinants of Health   Financial Resource Strain:   . Difficulty of Paying Living Expenses: Not on file  Food Insecurity:   . Worried About Programme researcher, broadcasting/film/video in the Last Year: Not on file  . Ran Out of Food in the Last Year: Not on file  Transportation Needs:   . Lack of Transportation (Medical): Not on file  . Lack of Transportation (Non-Medical): Not on file  Physical Activity:   . Days of Exercise per Week: Not on file  . Minutes of Exercise per Session: Not on file  Stress:   . Feeling of Stress : Not on file  Social Connections:   . Frequency of Communication with Friends and Family: Not on file  . Frequency of Social Gatherings with Friends and Family: Not on file  . Attends Religious Services: Not on file  . Active Member of Clubs or Organizations: Not on file  . Attends Banker Meetings: Not on file  . Marital Status: Not on file  Intimate Partner Violence:   . Fear of Current or Ex-Partner: Not on file  . Emotionally Abused: Not on file  .  Physically Abused: Not on file  . Sexually Abused: Not on file   Family History  Problem Relation Age of Onset  . Hypertension Maternal Grandmother   . Hypertension Maternal Grandfather   . Heart disease Maternal Grandfather     OBJECTIVE:  Vitals:   12/17/19 1440  BP: 109/70  Pulse: (!) 101  Resp: 16  Temp: 99.5 F (37.5 C)  TempSrc: Oral  SpO2: 97%     General appearance: alert; appears fatigued, but nontoxic; speaking in full sentences and tolerating own secretions HEENT: NCAT; Ears: EACs clear, TMs pearly gray; Eyes: PERRL.  EOM grossly intact. Sinuses:maxilliary tender; Nose: nares patent without rhinorrhea, Throat: oropharynx clear, tonsils non erythematous or enlarged, uvula  midline  Neck: supple without LAD Lungs: unlabored respirations, symmetrical air entry; cough: moderate; no respiratory distress; CTAB Heart: regular rate and rhythm.  Radial pulses 2+ symmetrical bilaterally Skin: warm and dry Psychological: alert and cooperative; normal mood and affect  LABS:  No results found for this or any previous visit (from the past 24 hour(s)).   ASSESSMENT & PLAN:  1. Viral URI with cough   2. Encounter for screening for COVID-19   3. Chronic maxillary sinusitis     Meds ordered this encounter  Medications  . benzonatate (TESSALON) 100 MG capsule    Sig: Take 1 capsule (100 mg total) by mouth every 8 (eight) hours.    Dispense:  30 capsule    Refill:  0  . montelukast (SINGULAIR) 4 MG chewable tablet    Sig: Chew 1 tablet (4 mg total) by mouth at bedtime.    Dispense:  30 tablet    Refill:  0  . predniSONE (DELTASONE) 10 MG tablet    Sig: Take 2 tablets (20 mg total) by mouth daily.    Dispense:  15 tablet    Refill:  0  . dexamethasone (DECADRON) injection 10 mg    Discharge Instructions   COVID testing ordered.  It will take between 2-7 days for test results.  Someone will contact you regarding abnormal results.    In the meantime: You should remain isolated in your home for 10 days from symptom onset AND greater than 24 hours after symptoms resolution (absence of fever without the use of fever-reducing medication and improvement in respiratory symptoms), whichever is longer Get plenty of rest and push fluids Tessalon Perles prescribed for cough Low-dose prednisone prescribed Use medications daily for symptom relief Use OTC medications like ibuprofen or tylenol as needed fever or pain Call or go to the ED if you have any new or worsening symptoms such as fever, worsening cough, shortness of breath, chest tightness, chest pain, turning blue, changes in mental status, etc...   Reviewed expectations re: course of current medical issues.  Questions answered. Outlined signs and symptoms indicating need for more acute intervention. Patient verbalized understanding. After Visit Summary given.      Note: This document was prepared using Dragon voice recognition software and may include unintentional dictation errors.    Durward Parcel, FNP 12/17/19 1507

## 2019-12-17 NOTE — ED Triage Notes (Signed)
sinus congestion, body aches, cough fever since thrusday. states she gets sinus infections but usually does not run a temp with them

## 2019-12-19 LAB — SARS-COV-2, NAA 2 DAY TAT

## 2019-12-19 LAB — NOVEL CORONAVIRUS, NAA: SARS-CoV-2, NAA: DETECTED — AB

## 2019-12-20 ENCOUNTER — Encounter: Payer: Self-pay | Admitting: Oncology

## 2019-12-20 ENCOUNTER — Telehealth: Payer: Self-pay | Admitting: Oncology

## 2019-12-20 NOTE — Telephone Encounter (Signed)
Called to Discuss with patient about Covid symptoms and the use of regeneron, a monoclonal antibody infusion for those with mild to moderate Covid symptoms and at a high risk of hospitalization.     Pt is qualified for this infusion at the [facility] infusion center due to co-morbid conditions and/or a member of an at-risk group.    Past Medical History:  Diagnosis Date  . Contraceptive management 03/03/2014  . Medical history non-contributory   . Pregnant 10/19/2015  . Vaginal burning 11/16/2015  . Vaginal discharge during pregnancy in first trimester 11/16/2015  . Yeast infection 11/02/2012    Unable to reach pt. VM full- My chart message sent.   Mignon Pine, AGNP-C [phone] (Infusion Center Hotline)

## 2020-02-07 ENCOUNTER — Ambulatory Visit (INDEPENDENT_AMBULATORY_CARE_PROVIDER_SITE_OTHER): Payer: BC Managed Care – PPO | Admitting: Obstetrics and Gynecology

## 2020-02-07 ENCOUNTER — Other Ambulatory Visit: Payer: Self-pay

## 2020-02-07 ENCOUNTER — Encounter: Payer: Self-pay | Admitting: Obstetrics and Gynecology

## 2020-02-07 ENCOUNTER — Ambulatory Visit: Payer: BC Managed Care – PPO | Admitting: Obstetrics and Gynecology

## 2020-02-07 ENCOUNTER — Other Ambulatory Visit (HOSPITAL_COMMUNITY)
Admission: RE | Admit: 2020-02-07 | Discharge: 2020-02-07 | Disposition: A | Discharge status: Home / Self Care | Payer: BC Managed Care – PPO | Source: Ambulatory Visit | Attending: Obstetrics and Gynecology | Admitting: Obstetrics and Gynecology

## 2020-02-07 VITALS — BP 105/69 | HR 79 | Ht 62.0 in | Wt 137.0 lb

## 2020-02-07 DIAGNOSIS — Z113 Encounter for screening for infections with a predominantly sexual mode of transmission: Secondary | ICD-10-CM

## 2020-02-07 NOTE — Progress Notes (Signed)
Megan Tran presents for STD testing. She has not concerns for STD's, just wants to be check  PE AF VSS Lungs clear Heart RRR Abd soft + BS GU Nl EGBUS no discharge cervix no lesions Swab collected  A/P STD screening F/U and Tx as per test results

## 2020-02-07 NOTE — Patient Instructions (Signed)
Health Maintenance, Female Adopting a healthy lifestyle and getting preventive care are important in promoting health and wellness. Ask your health care provider about:  The right schedule for you to have regular tests and exams.  Things you can do on your own to prevent diseases and keep yourself healthy. What should I know about diet, weight, and exercise? Eat a healthy diet   Eat a diet that includes plenty of vegetables, fruits, low-fat dairy products, and lean protein.  Do not eat a lot of foods that are high in solid fats, added sugars, or sodium. Maintain a healthy weight Body mass index (BMI) is used to identify weight problems. It estimates body fat based on height and weight. Your health care provider can help determine your BMI and help you achieve or maintain a healthy weight. Get regular exercise Get regular exercise. This is one of the most important things you can do for your health. Most adults should:  Exercise for at least 150 minutes each week. The exercise should increase your heart rate and make you sweat (moderate-intensity exercise).  Do strengthening exercises at least twice a week. This is in addition to the moderate-intensity exercise.  Spend less time sitting. Even light physical activity can be beneficial. Watch cholesterol and blood lipids Have your blood tested for lipids and cholesterol at 27 years of age, then have this test every 5 years. Have your cholesterol levels checked more often if:  Your lipid or cholesterol levels are high.  You are older than 27 years of age.  You are at high risk for heart disease. What should I know about cancer screening? Depending on your health history and family history, you may need to have cancer screening at various ages. This may include screening for:  Breast cancer.  Cervical cancer.  Colorectal cancer.  Skin cancer.  Lung cancer. What should I know about heart disease, diabetes, and high blood  pressure? Blood pressure and heart disease  High blood pressure causes heart disease and increases the risk of stroke. This is more likely to develop in people who have high blood pressure readings, are of African descent, or are overweight.  Have your blood pressure checked: ? Every 3-5 years if you are 18-39 years of age. ? Every year if you are 40 years old or older. Diabetes Have regular diabetes screenings. This checks your fasting blood sugar level. Have the screening done:  Once every three years after age 40 if you are at a normal weight and have a low risk for diabetes.  More often and at a younger age if you are overweight or have a high risk for diabetes. What should I know about preventing infection? Hepatitis B If you have a higher risk for hepatitis B, you should be screened for this virus. Talk with your health care provider to find out if you are at risk for hepatitis B infection. Hepatitis C Testing is recommended for:  Everyone born from 1945 through 1965.  Anyone with known risk factors for hepatitis C. Sexually transmitted infections (STIs)  Get screened for STIs, including gonorrhea and chlamydia, if: ? You are sexually active and are younger than 27 years of age. ? You are older than 27 years of age and your health care provider tells you that you are at risk for this type of infection. ? Your sexual activity has changed since you were last screened, and you are at increased risk for chlamydia or gonorrhea. Ask your health care provider if   you are at risk.  Ask your health care provider about whether you are at high risk for HIV. Your health care provider may recommend a prescription medicine to help prevent HIV infection. If you choose to take medicine to prevent HIV, you should first get tested for HIV. You should then be tested every 3 months for as long as you are taking the medicine. Pregnancy  If you are about to stop having your period (premenopausal) and  you may become pregnant, seek counseling before you get pregnant.  Take 400 to 800 micrograms (mcg) of folic acid every day if you become pregnant.  Ask for birth control (contraception) if you want to prevent pregnancy. Osteoporosis and menopause Osteoporosis is a disease in which the bones lose minerals and strength with aging. This can result in bone fractures. If you are 65 years old or older, or if you are at risk for osteoporosis and fractures, ask your health care provider if you should:  Be screened for bone loss.  Take a calcium or vitamin D supplement to lower your risk of fractures.  Be given hormone replacement therapy (HRT) to treat symptoms of menopause. Follow these instructions at home: Lifestyle  Do not use any products that contain nicotine or tobacco, such as cigarettes, e-cigarettes, and chewing tobacco. If you need help quitting, ask your health care provider.  Do not use street drugs.  Do not share needles.  Ask your health care provider for help if you need support or information about quitting drugs. Alcohol use  Do not drink alcohol if: ? Your health care provider tells you not to drink. ? You are pregnant, may be pregnant, or are planning to become pregnant.  If you drink alcohol: ? Limit how much you use to 0-1 drink a day. ? Limit intake if you are breastfeeding.  Be aware of how much alcohol is in your drink. In the [place] one drink equals one 12 oz bottle of beer (355 mL), one 5 oz glass of wine (148 mL), or one 1 oz glass of hard liquor (44 mL). General instructions  Schedule regular health, dental, and eye exams.  Stay current with your vaccines.  Tell your health care provider if: ? You often feel depressed. ? You have ever been abused or do not feel safe at home. Summary  Adopting a healthy lifestyle and getting preventive care are important in promoting health and wellness.  Follow your health care provider's instructions about healthy  diet, exercising, and getting tested or screened for diseases.  Follow your health care provider's instructions on monitoring your cholesterol and blood pressure. This information is not intended to replace advice given to you by your health care provider. Make sure you discuss any questions you have with your health care provider. Document Revised: 03/31/2018 Document Reviewed: 03/31/2018 Elsevier Patient Education  2020 Elsevier Inc.  

## 2020-02-08 LAB — HEPATITIS B SURFACE ANTIGEN: Hepatitis B Surface Ag: NEGATIVE

## 2020-02-08 LAB — RPR: RPR Ser Ql: NONREACTIVE

## 2020-02-08 LAB — HIV ANTIBODY (ROUTINE TESTING W REFLEX): HIV Screen 4th Generation wRfx: NONREACTIVE

## 2020-02-09 LAB — CERVICOVAGINAL ANCILLARY ONLY
Bacterial Vaginitis (gardnerella): NEGATIVE
Candida Glabrata: NEGATIVE
Candida Vaginitis: NEGATIVE
Chlamydia: NEGATIVE
Comment: NEGATIVE
Comment: NEGATIVE
Comment: NEGATIVE
Comment: NEGATIVE
Comment: NEGATIVE
Comment: NORMAL
Neisseria Gonorrhea: NEGATIVE
Trichomonas: NEGATIVE

## 2020-03-02 IMAGING — US ULTRASOUND LEFT BREAST LIMITED
1 series · 3 of 3 positions shown · non-contrast
Comparison: None

CLINICAL DATA: 27-year-old female with pain in the UPPER OUTER LEFT
breast and palpable thickening in the OUTER LEFT breast discovered
on clinical examination.

EXAM:
ULTRASOUND OF THE LEFT BREAST

[Series 1: ultrasound left breast limited · 0.07mm/px · 3 of 3 slices shown]
[im 1/3]
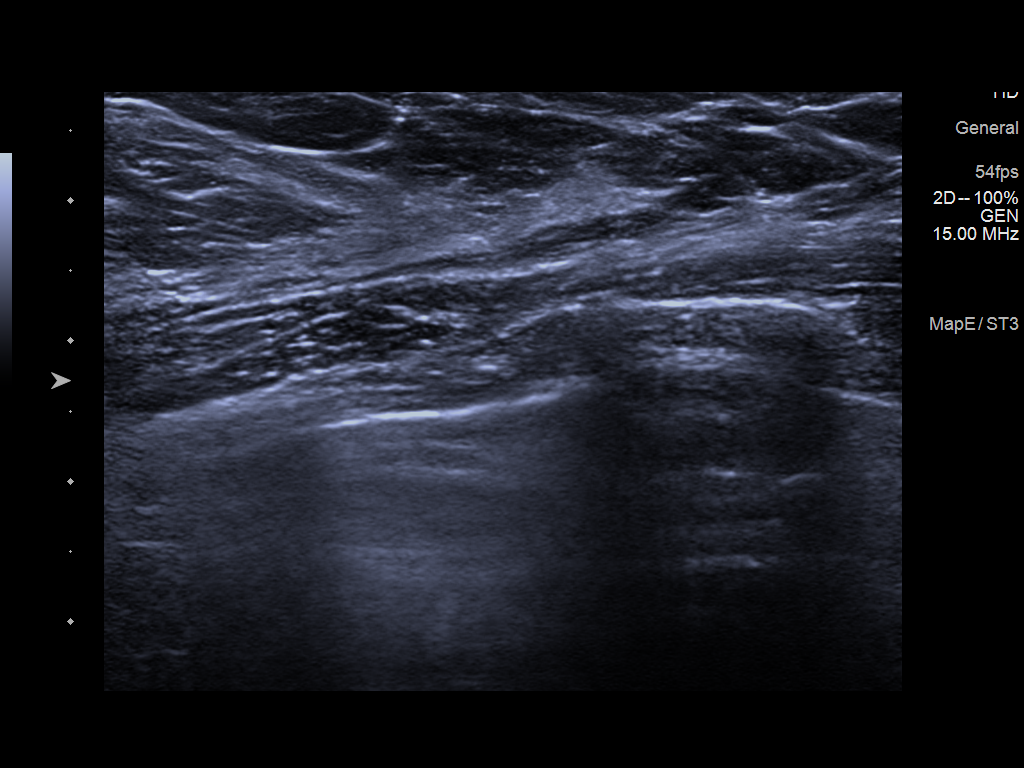
[im 2/3]
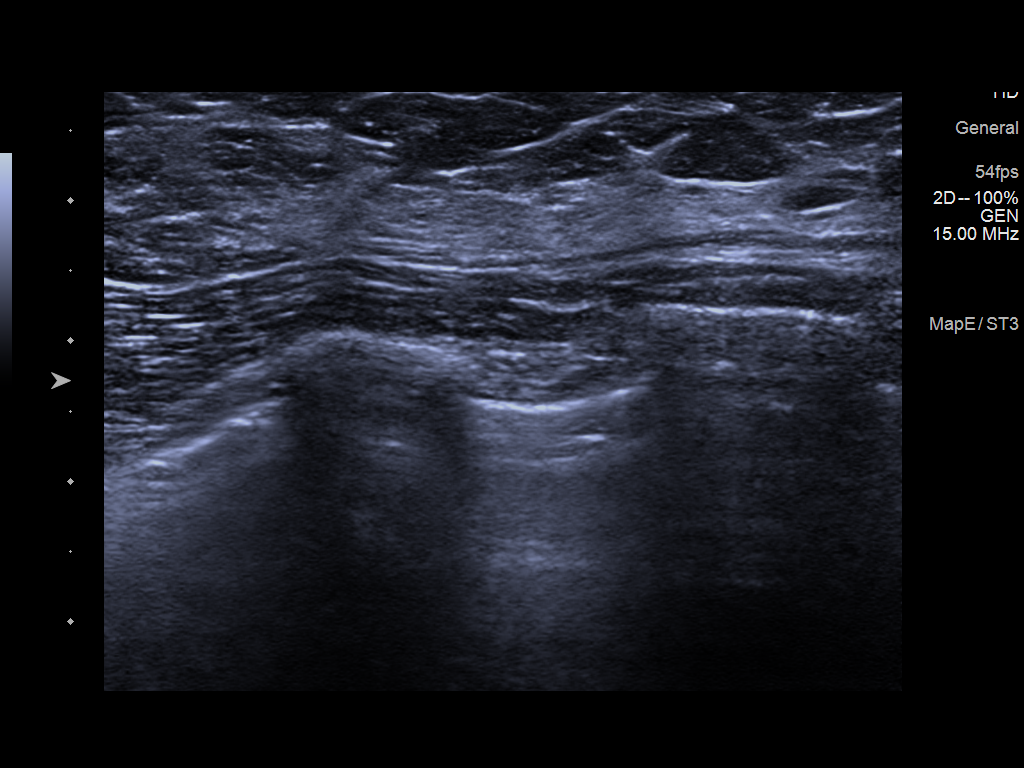
[im 3/3]
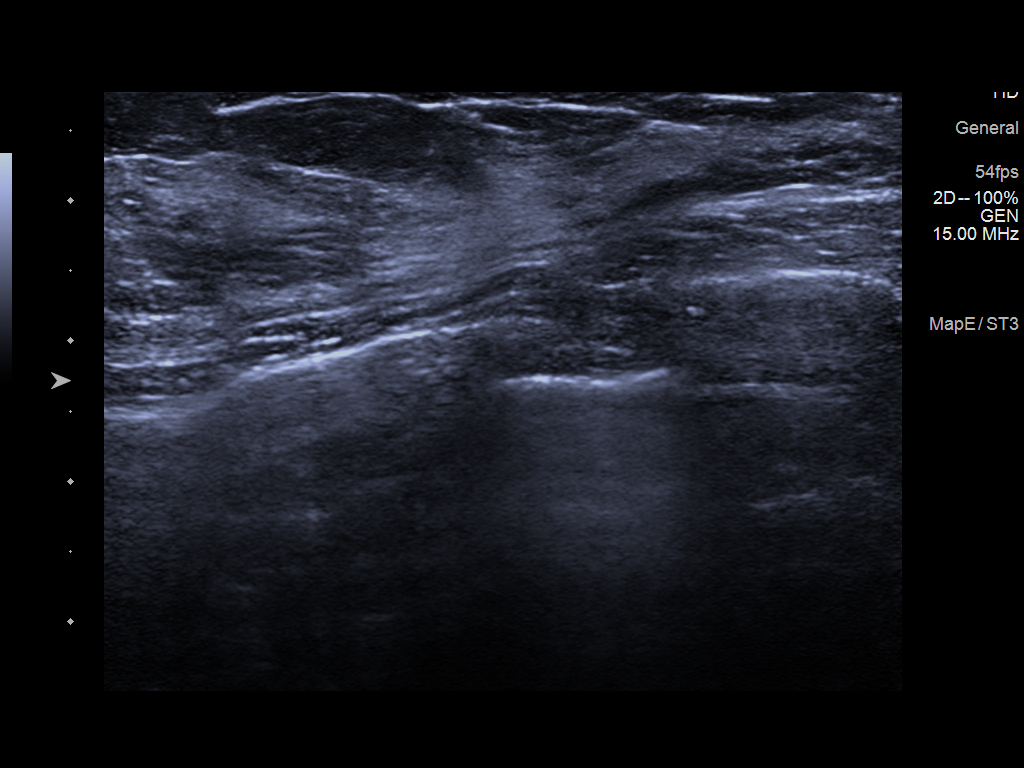

[3 of 3 positions shown; findings below may reference images not displayed]

FINDINGS: Targeted ultrasound is performed, showing normal dense
fibroglandular tissue throughout the UPPER and OUTER LEFT breast. No
solid mass, distortion or abnormal shadowing identified.
IMPRESSION: No sonographic abnormalities within the UPPER or OUTER LEFT breast.

RECOMMENDATION:
Clinical follow-up of LEFT breast pain as indicated.

Bilateral screening mammogram at age 42

I have discussed the findings and recommendations with the patient.
Results were also provided in writing at the conclusion of the
visit. If applicable, a reminder letter will be sent to the patient
regarding the next appointment.

BI-RADS CATEGORY  1: Negative.

## 2020-05-28 ENCOUNTER — Ambulatory Visit: Payer: BC Managed Care – PPO | Admitting: Adult Health

## 2020-06-08 ENCOUNTER — Encounter: Payer: Self-pay | Admitting: Women's Health

## 2020-06-08 ENCOUNTER — Other Ambulatory Visit: Payer: Self-pay

## 2020-06-08 ENCOUNTER — Ambulatory Visit (INDEPENDENT_AMBULATORY_CARE_PROVIDER_SITE_OTHER): Payer: BC Managed Care – PPO | Admitting: Women's Health

## 2020-06-08 VITALS — BP 117/69 | HR 96 | Ht 65.0 in | Wt 142.0 lb

## 2020-06-08 DIAGNOSIS — Z30015 Encounter for initial prescription of vaginal ring hormonal contraceptive: Secondary | ICD-10-CM | POA: Diagnosis not present

## 2020-06-08 DIAGNOSIS — Z30011 Encounter for initial prescription of contraceptive pills: Secondary | ICD-10-CM | POA: Diagnosis not present

## 2020-06-08 LAB — POCT URINE PREGNANCY: Preg Test, Ur: NEGATIVE

## 2020-06-08 MED ORDER — ANNOVERA 0.013-0.15 MG/24HR VA RING: VAGINAL_RING | VAGINAL | 0 refills | Status: DC

## 2020-06-08 NOTE — Progress Notes (Signed)
   GYN VISIT Patient name: Megan Tran MRN [id number]  Date of birth: 08-04-1990 Chief Complaint:   Contraception  History of Present Illness:   Megan Tran is a 30 y.o. [id number] Caucasian female being seen today for birth control. Wants ring. Does not smoke, no h/o HTN, DVT/PE, CVA, MI. Had 1 headache w/ seeing spots a long time ago, never dx w/ migraines w/ aura. Discussed nuvaring vs annovera, wants annovera.  Depression screen [facility] 2/9 04/19/2018 03/23/2018  Decreased Interest 0 0  Down, Depressed, Hopeless 0 0  PHQ - 2 Score 0 0    Patient's last menstrual period was 05/21/2020 (exact date). The current method of family planning is condoms.  Last pap 04/19/18. Results were: NILM w/ HRHPV not done Review of Systems:   Pertinent items are noted in HPI Denies fever/chills, dizziness, headaches, visual disturbances, fatigue, shortness of breath, chest pain, abdominal pain, vomiting, abnormal vaginal discharge/itching/odor/irritation, problems with periods, bowel movements, urination, or intercourse unless otherwise stated above.  Pertinent History Reviewed:  Reviewed past medical,surgical, social, obstetrical and family history.  Reviewed problem list, medications and allergies. Physical Assessment:   Vitals:   06/08/20 1208  BP: 117/69  Pulse: 96  Weight: 142 lb (64.4 kg)  Height: 5\' 5"  (1.651 m)  Body mass index is 23.63 kg/m.       Physical Examination:   General appearance: alert, well appearing, and in no distress  Mental status: alert, oriented to person, place, and time  Skin: warm & dry   Cardiovascular: normal heart rate noted  Respiratory: normal respiratory effort, no distress  Abdomen: soft, non-tender   Pelvic: examination not indicated  Extremities: no edema   Chaperone: N/A    Results for orders placed or performed in visit on 06/08/20 (from the past 24 hour(s))  POCT urine pregnancy   Collection Time: 06/08/20 12:05 PM  Result Value Ref Range    Preg Test, Ur Negative Negative    Assessment & Plan:  1) Contraception management> rx annovera, discussed use, f/u 06/10/20 for med f/u in office. Condoms x 2wks  Meds:  Meds ordered this encounter  Medications  . Segesterone-Ethinyl Estradiol (ANNOVERA) 0.15-0.013 MG/24HR RING    Sig: Place vaginally, leave in for 3 weeks, remove for 1 week to have period, then replace    Dispense:  1 each    Refill:  0    Order Specific Question:   Supervising Provider    Answer:   05-09-2000 [[id number]]    Orders Placed This Encounter  Procedures  . POCT urine pregnancy    Return in about 3 months (around 09/05/2020) for med f/u in office, then Dec for pap & physical .  Jan CNM, [facility] 06/08/2020 12:59 PM

## 2020-08-22 ENCOUNTER — Encounter (INDEPENDENT_AMBULATORY_CARE_PROVIDER_SITE_OTHER): Payer: Self-pay | Admitting: *Deleted

## 2020-09-10 ENCOUNTER — Ambulatory Visit: Payer: BC Managed Care – PPO | Admitting: Women's Health

## 2020-10-09 ENCOUNTER — Other Ambulatory Visit: Payer: BC Managed Care – PPO | Admitting: Women's Health

## 2020-12-20 ENCOUNTER — Ambulatory Visit (INDEPENDENT_AMBULATORY_CARE_PROVIDER_SITE_OTHER): Payer: BC Managed Care – PPO | Admitting: Gastroenterology

## 2020-12-20 ENCOUNTER — Encounter (INDEPENDENT_AMBULATORY_CARE_PROVIDER_SITE_OTHER): Payer: Self-pay | Admitting: Gastroenterology

## 2020-12-20 ENCOUNTER — Other Ambulatory Visit: Payer: Self-pay

## 2020-12-20 DIAGNOSIS — K5900 Constipation, unspecified: Secondary | ICD-10-CM | POA: Insufficient documentation

## 2020-12-20 DIAGNOSIS — R140 Abdominal distension (gaseous): Secondary | ICD-10-CM

## 2020-12-20 DIAGNOSIS — K219 Gastro-esophageal reflux disease without esophagitis: Secondary | ICD-10-CM | POA: Diagnosis not present

## 2020-12-20 MED ORDER — DICYCLOMINE HCL 10 MG PO CAPS: 10.0000 mg | ORAL_CAPSULE | Freq: Two times a day (BID) | ORAL | 2 refills | Status: DC | PRN

## 2020-12-20 MED ORDER — ESOMEPRAZOLE MAGNESIUM 40 MG PO CPDR: 40.0000 mg | DELAYED_RELEASE_CAPSULE | Freq: Every day | ORAL | 1 refills | Status: DC

## 2020-12-20 NOTE — Progress Notes (Signed)
Megan Tran, M.D. Gastroenterology & Hepatology [facility] [address] [place], [place] [address] Primary Care Physician: [name], MD [address] [place] [address]  Referring MD: PCP  Chief Complaint: Heartburn and bloating  History of Present Illness: Megan Tran is a 30 y.o. female with past medical history of GERD, who presents for evaluation of heartburn and bloating.  Patient reports that for the last 4 year she has been presenting recurrent episodes of burning pain in her mid chest which radiates to her throat. She states it is currently happening on a daily basis. She has noticed [place] food, greassy food and alcohol worsens her symptoms. She also notices that sometimes "there is air coming up and it burns".  She takes Nexium 20 mg in AM and PM, which somehow helps but she still has episodes of regurgitation and heartburn when waking up. No odynophagia or dysphagia. Occasionally has some discomfort in her throat.  She states that she has presented bloating frequently since she was child. She is having these symptoms on a daily basis. Takes Gasex for this but also has noticed some body positions help with her symptoms. States she cannot burp and has frequent pain in her lower abdomen. She has 1 BM every day but she has to strain occasionally. Most of her stool are described as pellets.However, she reports that she sometimes cannot burp when she has bloating.    The patient denies having any nausea, vomiting, fever, chills, hematochezia, melena, hematemesis, abdominal distention, abdominal pain, diarrhea, jaundice, pruritus or weight loss.  Last PXT:GGYIR Last Colonoscopy:never  FHx: mothe has GERD, neg for any gastrointestinal/liver disease, no malignancies Social: neg smoking, frequent alcohol or illicit drug use Surgical: no abdominal surgeries  Past Medical History: Past Medical History:   Diagnosis Date   Contraceptive management 03/03/2014   Medical history non-contributory    Pregnant 10/19/2015   Vaginal burning 11/16/2015   Vaginal discharge during pregnancy in first trimester 11/16/2015   Yeast infection 11/02/2012    Past Surgical History: Past Surgical History:  Procedure Laterality Date   MOUTH SURGERY  Oct. 2012    Family History: Family History  Problem Relation Age of Onset   Hypertension Maternal Grandmother    Hypertension Maternal Grandfather    Heart disease Maternal Grandfather     Social History: Social History   Tobacco Use  Smoking Status Former   Types: Cigarettes   Quit date: 09/24/2015   Years since quitting: 5.2  Smokeless Tobacco Former   Social History   Substance and Sexual Activity  Alcohol Use Not Currently   Comment: occassional; not now   Social History   Substance and Sexual Activity  Drug Use No    Allergies: Allergies  Allergen Reactions   Cefdinir Hives and Other (See Comments)    Tight in throat    Medications: Current Outpatient Medications  Medication Sig Dispense Refill   esomeprazole (NEXIUM) 20 MG capsule Take 20 mg by mouth daily at 12 noon.     MONTELUKAST SODIUM PO Take by mouth. prn     Segesterone-Ethinyl Estradiol (ANNOVERA) 0.15-0.013 MG/24HR RING Place vaginally, leave in for 3 weeks, remove for 1 week to have period, then replace 1 each 0   No current facility-administered medications for this visit.    Review of Systems: GENERAL: negative for malaise, night sweats HEENT: No changes in hearing or vision, no nose bleeds or other nasal problems. NECK: Negative for lumps, goiter, pain  and significant neck swelling RESPIRATORY: Negative for cough, wheezing CARDIOVASCULAR: Negative for chest pain, leg swelling, palpitations, orthopnea GI: SEE HPI MUSCULOSKELETAL: Negative for joint pain or swelling, back pain, and muscle pain. SKIN: Negative for lesions, rash PSYCH: Negative for sleep  disturbance, mood disorder and recent psychosocial stressors. HEMATOLOGY Negative for prolonged bleeding, bruising easily, and swollen nodes. ENDOCRINE: Negative for cold or heat intolerance, polyuria, polydipsia and goiter. NEURO: negative for tremor, gait imbalance, syncope and seizures. The remainder of the review of systems is noncontributory.   Physical Exam: BP 106/72 (BP Location: Left Arm, Patient Position: Sitting, Cuff Size: Large)   Pulse 83   Temp 98.9 F (37.2 C) (Oral)   Ht 5\' 5"  (1.651 m)   Wt 147 lb 14.4 oz (67.1 kg)   BMI 24.61 kg/m  GENERAL: The patient is AO x3, in no acute distress. HEENT: Head is normocephalic and atraumatic. EOMI are intact. Mouth is well hydrated and without lesions. NECK: Supple. No masses LUNGS: Clear to auscultation. No presence of rhonchi/wheezing/rales. Adequate chest expansion HEART: RRR, normal s1 and s2. ABDOMEN: Soft, nontender, no guarding, no peritoneal signs, and nondistended. BS +. No masses. EXTREMITIES: Without any cyanosis, clubbing, rash, lesions or edema. NEUROLOGIC: AOx3, no focal motor deficit. SKIN: no jaundice, no rashes   Imaging/Labs: as above  I personally reviewed and interpreted the available labs, imaging and endoscopic files.  Impression and Plan: Megan Tran is a 30 y.o. female with past medical history of GERD, who presents for evaluation of heartburn, constipation and bloating.  Patient has presented recurrent episodes of heartburn despite taking her PPI compliantly.  It is possible that she would benefit from a higher dose PPI, for which I advised her to start taking Nexium 40 mg in the morning.  She was given instructions on the proper way to take this medication.  However, given persistence of symptoms we will explore this further with an EGD with small bowel biopsies.  I also advised her that if her PPI does not control her symptoms and the EGD is unremarkable, we will consider performing a 24-hour pH  impedance testing.  She understood and agreed.  She has also presented with episode of bloating and constipation chronically.  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  She would benefit from improving her bowel movements with MiraLAX on a daily basis but she can also take Bentyl to decrease her bloating episodes.  She will need to implement a low FODMAP diet.  We will check celiac serologies today.  Finally, I consider that her heartburn symptoms could be explained by bowel hypersensitivity which will go along with her IBS symptoms.  - Take Nexium 40 mg every day - Explained presumed etiology of reflux symptoms. Instruction provided in the use of antireflux medication - patient should take medication in the morning 30-45 minutes before eating breakfast. Discussed avoidance of eating within 2 hours of lying down to sleep and benefit of blocks to elevate head of bed. Also, will benefit from avoiding carbonated drinks/sodas or food that has tomatoes, spicy or greasy food. - Schedule EGD - Start taking Miralax 1 capful every day for one week. If bowel movements do not improve, increase to 1 capful every 12 hours. If after two weeks there is no improvement, increase to 1 capful every 8 hours Start Bentyl 1 tablet q12h as needed for abdominal bloating - Explained presumed etiology of IBS symptoms. Patient was counseled about the benefit of implementing a low FODMAP  to improve symptoms and recurrent episodes. A dietary list was provided to the patient. Also, the patient was counseled about the benefit of avoiding stressing situations and potential environmental triggers leading to symptomatology. - Check celiac serology  All questions were answered.      Megan Blazing, MD Gastroenterology and Hepatology [facility]

## 2020-12-20 NOTE — Patient Instructions (Addendum)
Take Nexium 40 mg every day Explained presumed etiology of reflux symptoms. Instruction provided in the use of antireflux medication - patient should take medication in the morning 30-45 minutes before eating breakfast. Discussed avoidance of eating within 2 hours of lying down to sleep and benefit of blocks to elevate head of bed. Also, will benefit from avoiding carbonated drinks/sodas or food that has tomatoes, spicy or greasy food. Schedule EGD Start taking Miralax 1 capful every day for one week. If bowel movements do not improve, increase to 1 capful every 12 hours. If after two weeks there is no improvement, increase to 1 capful every 8 hours Start Bentyl 1 tablet q12h as needed for abdominal bloating Explained presumed etiology of IBS symptoms. Patient was counseled about the benefit of implementing a low FODMAP to improve symptoms and recurrent episodes. A dietary list was provided to the patient. Also, the patient was counseled about the benefit of avoiding stressing situations and potential environmental triggers leading to symptomatology. Perform blood workup

## 2021-01-01 ENCOUNTER — Other Ambulatory Visit (INDEPENDENT_AMBULATORY_CARE_PROVIDER_SITE_OTHER): Payer: Self-pay

## 2021-01-01 DIAGNOSIS — R140 Abdominal distension (gaseous): Secondary | ICD-10-CM

## 2021-01-01 DIAGNOSIS — K219 Gastro-esophageal reflux disease without esophagitis: Secondary | ICD-10-CM

## 2021-01-02 ENCOUNTER — Encounter (INDEPENDENT_AMBULATORY_CARE_PROVIDER_SITE_OTHER): Payer: Self-pay

## 2021-01-23 DIAGNOSIS — R140 Abdominal distension (gaseous): Secondary | ICD-10-CM | POA: Diagnosis not present

## 2021-01-24 LAB — CELIAC DISEASE PANEL
(tTG) Ab, IgA: <1 U/mL
(tTG) Ab, IgG: <1 U/mL
Gliadin IgA: 1.9 U/mL
Gliadin IgG: <1 U/mL
Immunoglobulin A: 382 mg/dL — ABNORMAL HIGH (ref 47–310)

## 2021-01-25 NOTE — Patient Instructions (Addendum)
20    Your procedure is scheduled on: 02/01/2021  Report to [facility] Main entrance at  7:15   AM.  Call this number if you have problems the morning of surgery: [phone]   Remember:   Follow instructions on letter from office regarding when to stop eating and drinking        No Smoking the day of procedure      Take these medicines the morning of surgery with A SIP OF WATER: Nexium   Do not wear jewelry, make-up or nail polish.  Do not wear lotions, powders, or perfumes. You may wear deodorant.                Do not bring valuables to the hospital.  Contacts, dentures or bridgework may not be worn into surgery.  Leave suitcase in the car. After surgery it may be brought to your room.  For patients admitted to the hospital, checkout time is 11:00 AM the day of discharge.   Patients discharged the day of surgery will not be allowed to drive home. Upper Endoscopy, Adult Upper endoscopy is a procedure to look inside the upper GI (gastrointestinal) tract. The upper GI tract is made up of: The part of the body that moves food from your mouth to your stomach (esophagus). The stomach. The first part of your small intestine (duodenum). This procedure is also called esophagogastroduodenoscopy (EGD) or gastroscopy. In this procedure, your health care provider passes a thin, flexible tube (endoscope) through your mouth and down your esophagus into your stomach. A small camera is attached to the end of the tube. Images from the camera appear on a monitor in the exam room. During this procedure, your health care provider may also remove a small piece of tissue to be sent to a lab and examined under a microscope (biopsy). Your health care provider may do an upper endoscopy to diagnose cancers of the upper GI tract. You may also have this procedure to find the cause of other conditions, such as: Stomach pain. Heartburn. Pain or problems when swallowing. Nausea and vomiting. Stomach  bleeding. Stomach ulcers. Tell a health care provider about: Any allergies you have. All medicines you are taking, including vitamins, herbs, eye drops, creams, and over-the-counter medicines. Any problems you or family members have had with anesthetic medicines. Any blood disorders you have. Any surgeries you have had. Any medical conditions you have. Whether you are pregnant or may be pregnant. What are the risks? Generally, this is a safe procedure. However, problems may occur, including: Infection. Bleeding. Allergic reactions to medicines. A tear or hole (perforation) in the esophagus, stomach, or duodenum. What happens before the procedure? Staying hydrated Follow instructions from your health care provider about hydration, which may include: Up to 4 hours before the procedure - you may continue to drink clear liquids, such as water, clear fruit juice, black coffee, and plain tea.   Medicines Ask your health care provider about: Changing or stopping your regular medicines. This is especially important if you are taking diabetes medicines or blood thinners. Taking medicines such as aspirin and ibuprofen. These medicines can thin your blood. Do not take these medicines unless your health care provider tells you to take them. Taking over-the-counter medicines, vitamins, herbs, and supplements. General instructions Plan to have someone take you home from the hospital or clinic. If you will be going home right after the procedure, plan to have someone with you for 24 hours. Ask your health care  provider what steps will be taken to help prevent infection. What happens during the procedure?  An IV will be inserted into one of your veins. You may be given one or more of the following: A medicine to help you relax (sedative). A medicine to numb the throat (local anesthetic). You will lie on your left side on an exam table. Your health care provider will pass the endoscope through  your mouth and down your esophagus. Your health care provider will use the scope to check the inside of your esophagus, stomach, and duodenum. Biopsies may be taken. The endoscope will be removed. The procedure may vary among health care providers and hospitals. What happens after the procedure? Your blood pressure, heart rate, breathing rate, and blood oxygen level will be monitored until you leave the hospital or clinic. Do not drive for 24 hours if you were given a sedative during your procedure. When your throat is no longer numb, you may be given some fluids to drink. It is up to you to get the results of your procedure. Ask your health care provider, or the department that is doing the procedure, when your results will be ready. Summary Upper endoscopy is a procedure to look inside the upper GI tract. During the procedure, an IV will be inserted into one of your veins. You may be given a medicine to help you relax. A medicine will be used to numb your throat. The endoscope will be passed through your mouth and down your esophagus. This information is not intended to replace advice given to you by your health care provider. Make sure you discuss any questions you have with your health care provider. Document Revised: 09/30/2017 Document Reviewed: 09/07/2017 Elsevier Patient Education  [place] After  Please read the instructions outlined below and refer to this sheet in the next few weeks. These discharge instructions provide you with general information on caring for yourself after you leave the hospital. Your doctor may also give you specific instructions. While your treatment has been planned according to the most current medical practices available, unavoidable complications occasionally occur. If you have any problems or  questions after discharge, please call your doctor. HOME CARE INSTRUCTIONS Activity You may resume your regular activity but move at a slower pace for the next 24 hours.  Take frequent rest periods for the next 24 hours.  Walking will help expel (get rid of) the air and reduce the bloated feeling in your abdomen.  No driving for 24 hours (because of the anesthesia (medicine) used during the test).  You may shower.  Do not sign any important legal documents or operate any machinery for 24 hours (because of the anesthesia used during the  test).  Nutrition Drink plenty of fluids.  You may resume your normal diet.  Begin with a light meal and progress to your normal diet.  Avoid alcoholic beverages for 24 hours or as instructed by your caregiver.  Medications You may resume your normal medications unless your caregiver tells you otherwise. What you can expect today You may experience abdominal discomfort such as a feeling of fullness or "gas" pains.  You may experience a sore throat for 2 to 3 days. This is normal. Gargling with salt water may help this.  Follow-up Your doctor will discuss the results of your test with you. SEEK IMMEDIATE MEDICAL CARE IF: You have excessive nausea (feeling sick to your stomach) and/or vomiting.  You have severe abdominal pain and distention (swelling).  You have trouble swallowing.  You have a temperature over 100 F (37.8 C).  You have rectal bleeding or vomiting of blood.  Document Released: 11/20/2003 Document Revised: 03/27/2011 Document Reviewed: 06/02/2007

## 2021-01-29 ENCOUNTER — Encounter (HOSPITAL_COMMUNITY): Payer: Self-pay

## 2021-01-29 ENCOUNTER — Encounter (HOSPITAL_COMMUNITY)
Admission: RE | Admit: 2021-01-29 | Discharge: 2021-01-29 | Disposition: A | Discharge status: Home / Self Care | Payer: BC Managed Care – PPO | Source: Ambulatory Visit | Attending: Gastroenterology | Admitting: Gastroenterology

## 2021-01-29 DIAGNOSIS — R12 Heartburn: Secondary | ICD-10-CM | POA: Diagnosis not present

## 2021-01-29 DIAGNOSIS — K2289 Other specified disease of esophagus: Secondary | ICD-10-CM | POA: Diagnosis not present

## 2021-01-29 DIAGNOSIS — Z01812 Encounter for preprocedural laboratory examination: Secondary | ICD-10-CM | POA: Insufficient documentation

## 2021-01-29 DIAGNOSIS — Z881 Allergy status to other antibiotic agents status: Secondary | ICD-10-CM | POA: Diagnosis not present

## 2021-01-29 DIAGNOSIS — R140 Abdominal distension (gaseous): Secondary | ICD-10-CM | POA: Insufficient documentation

## 2021-01-29 DIAGNOSIS — K219 Gastro-esophageal reflux disease without esophagitis: Secondary | ICD-10-CM | POA: Insufficient documentation

## 2021-01-29 DIAGNOSIS — Z79899 Other long term (current) drug therapy: Secondary | ICD-10-CM | POA: Diagnosis not present

## 2021-01-29 DIAGNOSIS — Z87891 Personal history of nicotine dependence: Secondary | ICD-10-CM | POA: Diagnosis not present

## 2021-01-29 DIAGNOSIS — K449 Diaphragmatic hernia without obstruction or gangrene: Secondary | ICD-10-CM | POA: Diagnosis not present

## 2021-01-29 HISTORY — DX: Gastro-esophageal reflux disease without esophagitis: K21.9

## 2021-01-29 LAB — PREGNANCY, URINE: Preg Test, Ur: NEGATIVE

## 2021-02-01 ENCOUNTER — Encounter (HOSPITAL_COMMUNITY)
Admission: RE | Disposition: A | Discharge status: Home / Self Care | Payer: Self-pay | Source: Home / Self Care | Attending: Gastroenterology

## 2021-02-01 ENCOUNTER — Other Ambulatory Visit: Payer: Self-pay

## 2021-02-01 ENCOUNTER — Encounter (HOSPITAL_COMMUNITY): Payer: Self-pay | Admitting: Gastroenterology

## 2021-02-01 ENCOUNTER — Ambulatory Visit (HOSPITAL_COMMUNITY)
Admission: RE | Admit: 2021-02-01 | Discharge: 2021-02-01 | Disposition: A | Discharge status: Home / Self Care | Payer: BC Managed Care – PPO | Attending: Gastroenterology | Admitting: Gastroenterology

## 2021-02-01 ENCOUNTER — Ambulatory Visit (HOSPITAL_COMMUNITY): Payer: BC Managed Care – PPO | Admitting: Anesthesiology

## 2021-02-01 DIAGNOSIS — K219 Gastro-esophageal reflux disease without esophagitis: Secondary | ICD-10-CM | POA: Diagnosis not present

## 2021-02-01 DIAGNOSIS — K449 Diaphragmatic hernia without obstruction or gangrene: Secondary | ICD-10-CM

## 2021-02-01 DIAGNOSIS — Z881 Allergy status to other antibiotic agents status: Secondary | ICD-10-CM | POA: Diagnosis not present

## 2021-02-01 DIAGNOSIS — Z87891 Personal history of nicotine dependence: Secondary | ICD-10-CM | POA: Diagnosis not present

## 2021-02-01 DIAGNOSIS — R12 Heartburn: Secondary | ICD-10-CM

## 2021-02-01 DIAGNOSIS — R140 Abdominal distension (gaseous): Secondary | ICD-10-CM

## 2021-02-01 DIAGNOSIS — K2289 Other specified disease of esophagus: Secondary | ICD-10-CM | POA: Diagnosis not present

## 2021-02-01 DIAGNOSIS — Z79899 Other long term (current) drug therapy: Secondary | ICD-10-CM | POA: Diagnosis not present

## 2021-02-01 HISTORY — PX: BIOPSY: SHX5522

## 2021-02-01 HISTORY — PX: ESOPHAGOGASTRODUODENOSCOPY (EGD) WITH PROPOFOL: SHX5813

## 2021-02-01 SURGERY — ESOPHAGOGASTRODUODENOSCOPY (EGD) WITH PROPOFOL
Anesthesia: General

## 2021-02-01 MED ORDER — PROPOFOL 500 MG/50ML IV EMUL
INTRAVENOUS | Status: DC | PRN
  Administered 2021-02-01: 150 ug/kg/min via INTRAVENOUS

## 2021-02-01 MED ORDER — LACTATED RINGERS IV SOLN: INTRAVENOUS | Status: DC

## 2021-02-01 MED ORDER — PROPOFOL 10 MG/ML IV BOLUS
INTRAVENOUS | Status: DC | PRN
  Administered 2021-02-01 (×2): 50 mg via INTRAVENOUS

## 2021-02-01 NOTE — H&P (Signed)
Megan Tran is an 30 y.o. female.   Chief Complaint:  heartburn and bloating. HPI: Megan Tran is a 30 y.o. female with past medical history of GERD, who presents for evaluation of heartburn and bloating.  Patient reported she has been taking Nexium 40 mg every day compliantly and has not felt any improvement.  Reports that she feels constant heartburn, usually worse when she lays flat.  This also causes significant bloating sensation but no abdominal pain.  She has not implemented any dietary changes and has not taken any Bentyl yet for her bloating.  Celiac serology was negative for any abnormalities.  Past Medical History:  Diagnosis Date   Contraceptive management 03/03/2014   GERD (gastroesophageal reflux disease)    Medical history non-contributory    Pregnant 10/19/2015   Vaginal burning 11/16/2015   Vaginal discharge during pregnancy in first trimester 11/16/2015   Yeast infection 11/02/2012    Past Surgical History:  Procedure Laterality Date   MOUTH SURGERY  Oct. 2012    Family History  Problem Relation Age of Onset   Hypertension Maternal Grandmother    Hypertension Maternal Grandfather    Heart disease Maternal Grandfather    Social History:  reports that she quit smoking about 5 years ago. Her smoking use included cigarettes. She has quit using smokeless tobacco. She reports that she does not currently use alcohol. She reports that she does not use drugs.  Allergies:  Allergies  Allergen Reactions   Cefdinir Anaphylaxis and Hives    Tight in throat    Medications Prior to Admission  Medication Sig Dispense Refill   esomeprazole (NEXIUM) 40 MG capsule Take 1 capsule (40 mg total) by mouth daily at 12 noon. (Patient taking differently: Take 40 mg by mouth 2 (two) times daily before a meal.) 90 capsule 1   montelukast (SINGULAIR) 10 MG tablet Take 10 mg by mouth at bedtime.     dicyclomine (BENTYL) 10 MG capsule Take 1 capsule (10 mg total) by mouth  every 12 (twelve) hours as needed (bloating and abdominal pain). 60 capsule 2    No results found for this or any previous visit (from the past 48 hour(s)). No results found.  Review of Systems  Constitutional: Negative.   HENT: Negative.    Eyes: Negative.   Respiratory: Negative.    Gastrointestinal:  Positive for abdominal distention.  Endocrine: Negative.   Genitourinary: Negative.   Musculoskeletal: Negative.   Skin: Negative.   Allergic/Immunologic: Negative.   Neurological: Negative.   Hematological: Negative.   Psychiatric/Behavioral: Negative.     Blood pressure 127/80, pulse 100, temperature 97.8 F (36.6 C), temperature source Oral, resp. rate 16, last menstrual period 01/23/2021, SpO2 99 %. Physical Exam  GENERAL: The patient is AO x3, in no acute distress. HEENT: Head is normocephalic and atraumatic. EOMI are intact. Mouth is well hydrated and without lesions. NECK: Supple. No masses LUNGS: Clear to auscultation. No presence of rhonchi/wheezing/rales. Adequate chest expansion HEART: RRR, normal s1 and s2. ABDOMEN: Soft, nontender, no guarding, no peritoneal signs, and nondistended. BS +. No masses. EXTREMITIES: Without any cyanosis, clubbing, rash, lesions or edema. NEUROLOGIC: AOx3, no focal motor deficit. SKIN: no jaundice, no rashes  Assessment/Plan LASUNDRA HASCALL is a 30 y.o. female with past medical history of GERD, who presents for evaluation of heartburn and bloating.  We will proceed esophagogastroduodenospy.  Dolores Frame, MD 02/01/2021, 7:58 AM

## 2021-02-01 NOTE — Op Note (Addendum)
[facility] Patient Name: Megan Tran Procedure Date: 02/01/2021 8:39 AM MRN: [id number] Date of Birth: 10-Sep-1990 Attending MD: [name] ,  CSN: [id number] Age: 30 Admit Type: Outpatient Procedure:                Upper GI endoscopy Indications:              Heartburn, Abdominal bloating Providers:                [name], Nena Polio, RN, Cyril Mourning, Technician Referring MD:              Medicines:                Monitored Anesthesia Care Complications:            No immediate complications. Estimated Blood Loss:     Estimated blood loss: none. Procedure:                Pre-Anesthesia Assessment:                           - Prior to the procedure, a History and Physical                            was performed, and patient medications, allergies                            and sensitivities were reviewed. The patient's                            tolerance of previous anesthesia was reviewed.                           - The risks and benefits of the procedure and the                            sedation options and risks were discussed with the                            patient. All questions were answered and informed                            consent was obtained.                           - ASA Grade Assessment: II - A patient with mild                            systemic disease.                           After obtaining informed consent, the endoscope was                            passed under direct vision. Throughout the  procedure, the patient's blood pressure, pulse, and                            oxygen saturations were monitored continuously. The                            GIF-H190 ([id number]) scope was introduced through the                            mouth, and advanced to the second part of duodenum.                            The upper GI endoscopy was accomplished without                             difficulty. The patient tolerated the procedure                            well. Scope In: 8:50:27 AM Scope Out: 8:56:13 AM Total Procedure Duration: 0 hours 5 minutes 46 seconds  Findings:      The Z-line was irregular and was found 34 cm from the incisors.      A 1 cm hiatal hernia was present.      The entire examined stomach was normal.      The examined duodenum was normal. Biopsies were taken with a cold       forceps for histology. Impression:               - Z-line irregular, 34 cm from the incisors.                           - 1 cm hiatal hernia.                           - Normal stomach.                           - Normal examined duodenum. Biopsied. Moderate Sedation:      Per Anesthesia Care Recommendation:           - Discharge patient to home (ambulatory).                           - Resume previous diet.                           - Await pathology results.                           - Referral for 24 hour pH impedance testing on                            current dose of Nexium. Procedure Code(s):        --- Professional ---                           [phone], Esophagogastroduodenoscopy, flexible,  transoral; with biopsy, single or multiple Diagnosis Code(s):        --- Professional ---                           K22.8, Other specified diseases of esophagus                           K44.9, Diaphragmatic hernia without obstruction or                            gangrene                           R12, Heartburn                           R14.0, Abdominal distension (gaseous) CPT copyright 2019 American Medical Association. All rights reserved. The codes documented in this report are preliminary and upon coder review may  be revised to meet current compliance requirements. [name], MD [name],  02/01/2021 8:59:04 AM This report has been signed electronically. Number of Addenda: 0

## 2021-02-01 NOTE — Transfer of Care (Signed)
Immediate Anesthesia Transfer of Care Note  Patient: Megan Tran  Procedure(s) Performed: ESOPHAGOGASTRODUODENOSCOPY (EGD) WITH PROPOFOL BIOPSY  Patient Location: PACU  Anesthesia Type:General  Level of Consciousness: awake, alert , oriented and patient cooperative  Airway & Oxygen Therapy: Patient Spontanous Breathing  Post-op Assessment: Report given to RN, Post -op Vital signs reviewed and stable and Patient moving all extremities X 4  Post vital signs: Reviewed and stable  Last Vitals:  Vitals Value Taken Time  BP 91/50 02/01/21 0903  Temp 36.5 C 02/01/21 0903  Pulse 104 02/01/21 0903  Resp 13 02/01/21 0903  SpO2 98 % 02/01/21 0903    Last Pain:  Vitals:   02/01/21 0903  TempSrc: Oral  PainSc: 0-No pain      Patients Stated Pain Goal: 8 (02/01/21 0903)  Complications: No notable events documented.

## 2021-02-01 NOTE — Anesthesia Postprocedure Evaluation (Signed)
Anesthesia Post Note  Patient: Megan Tran  Procedure(s) Performed: ESOPHAGOGASTRODUODENOSCOPY (EGD) WITH PROPOFOL BIOPSY  Patient location during evaluation: Phase II Anesthesia Type: General Level of consciousness: awake and alert and oriented Pain management: pain level controlled Vital Signs Assessment: post-procedure vital signs reviewed and stable Respiratory status: spontaneous breathing, nonlabored ventilation and respiratory function stable Cardiovascular status: blood pressure returned to baseline and stable Postop Assessment: no apparent nausea or vomiting Anesthetic complications: no   No notable events documented.   Last Vitals:  Vitals:   02/01/21 0903 02/01/21 0906  BP: (!) 91/50 97/63  Pulse: (!) 104 88  Resp: 13   Temp: 36.5 C   SpO2: 98%     Last Pain:  Vitals:   02/01/21 0903  TempSrc: Oral  PainSc: 0-No pain                 Gurman Ashland C Willard Farquharson

## 2021-02-01 NOTE — Anesthesia Preprocedure Evaluation (Signed)
Anesthesia Evaluation  Patient identified by MRN, date of birth, ID band Patient awake    Reviewed: Allergy & Precautions, NPO status , Patient's Chart, lab work & pertinent test results  Airway Mallampati: II  TM Distance: >3 FB Neck ROM: Full    Dental  (+) Dental Advisory Given, Teeth Intact   Pulmonary former smoker,    Pulmonary exam normal breath sounds clear to auscultation       Cardiovascular negative cardio ROS Normal cardiovascular exam Rhythm:Regular Rate:Normal     Neuro/Psych negative neurological ROS  negative psych ROS   GI/Hepatic Neg liver ROS, GERD  Medicated,  Endo/Other  negative endocrine ROS  Renal/GU negative Renal ROS  negative genitourinary   Musculoskeletal negative musculoskeletal ROS (+)   Abdominal   Peds negative pediatric ROS (+)  Hematology negative hematology ROS (+)   Anesthesia Other Findings Recent sore throat  Reproductive/Obstetrics negative OB ROS                             Anesthesia Physical Anesthesia Plan  ASA: 1  Anesthesia Plan: General   Post-op Pain Management:    Induction: Intravenous  PONV Risk Score and Plan: TIVA  Airway Management Planned: Nasal Cannula and Natural Airway  Additional Equipment:   Intra-op Plan:   Post-operative Plan:   Informed Consent: I have reviewed the patients History and Physical, chart, labs and discussed the procedure including the risks, benefits and alternatives for the proposed anesthesia with the patient or authorized representative who has indicated his/her understanding and acceptance.     Dental advisory given  Plan Discussed with: CRNA and Surgeon  Anesthesia Plan Comments:         Anesthesia Quick Evaluation

## 2021-02-01 NOTE — Discharge Instructions (Addendum)
You are being discharged to home.  Resume your previous diet.  We are waiting for your pathology results.  Referral for 24 hour pH impedance testing on current dose of Nexium.

## 2021-02-04 LAB — SURGICAL PATHOLOGY

## 2021-02-05 ENCOUNTER — Encounter (INDEPENDENT_AMBULATORY_CARE_PROVIDER_SITE_OTHER): Payer: Self-pay

## 2021-02-05 ENCOUNTER — Telehealth (INDEPENDENT_AMBULATORY_CARE_PROVIDER_SITE_OTHER): Payer: Self-pay

## 2021-02-05 NOTE — Telephone Encounter (Signed)
Patient called today stating she had a EGD on 02/01/2021. She states she has pain when swallowing and tenderness and swelling in neck. She also has a rash around her neck. She was started on Dicyclomine two days ago. She took it 02/03/2021 and 02/04/2021 and developed the rash today, has not taken it today.She states she does have some shortness of breath when bending down, but no other time. Please advise.

## 2021-02-05 NOTE — Telephone Encounter (Signed)
Patient aware of all and dicyclomine was added to her allergies.

## 2021-02-05 NOTE — Telephone Encounter (Signed)
She may need to stop Bentyl as she may be allergic to it (please add this to her chart), can try IBGard or edible peppermint oil as needed for bloating. No interventions were performed on her esophagus during most recent EGD, so not sure what is causing that pain , she may have some soreness in the neck for a few days but it is usually self limited.

## 2021-02-06 ENCOUNTER — Encounter (HOSPITAL_COMMUNITY): Payer: Self-pay | Admitting: Gastroenterology

## 2021-05-10 ENCOUNTER — Encounter: Payer: Self-pay | Admitting: Women's Health

## 2021-05-10 ENCOUNTER — Other Ambulatory Visit: Payer: Self-pay

## 2021-05-10 ENCOUNTER — Other Ambulatory Visit (HOSPITAL_COMMUNITY)
Admission: RE | Admit: 2021-05-10 | Discharge: 2021-05-10 | Disposition: A | Discharge status: Home / Self Care | Payer: BC Managed Care – PPO | Source: Ambulatory Visit | Attending: Women's Health | Admitting: Women's Health

## 2021-05-10 ENCOUNTER — Ambulatory Visit (INDEPENDENT_AMBULATORY_CARE_PROVIDER_SITE_OTHER): Payer: BC Managed Care – PPO | Admitting: Women's Health

## 2021-05-10 VITALS — BP 119/70 | HR 88 | Ht 64.0 in | Wt 148.0 lb

## 2021-05-10 DIAGNOSIS — N9089 Other specified noninflammatory disorders of vulva and perineum: Secondary | ICD-10-CM

## 2021-05-10 DIAGNOSIS — Z124 Encounter for screening for malignant neoplasm of cervix: Secondary | ICD-10-CM | POA: Insufficient documentation

## 2021-05-10 NOTE — Progress Notes (Signed)
° °  GYN VISIT Patient name: Megan Tran MRN [id number]  Date of birth: 1991-03-19 Chief Complaint:   having frequent yeast infections (+ irritation; pain during sex)  History of Present Illness:   Megan Tran is a 31 y.o. G27P1001 Caucasian female being seen today for report of frequent yeast infections, treats herself w/ otc creams. Sometimes has small clumps in discharge, no odor, no itching. Vulva just raw and irritated, so sex is painful. Trying to get pregnant x , not taking pnv yet.     Patient's last menstrual period was 04/29/2021. The current method of family planning is none.  Last pap 04/19/18. Results were: NILM w/ HRHPV not done  Depression screen [facility] 2/9 04/19/2018 03/23/2018  Decreased Interest 0 0  Down, Depressed, Hopeless 0 0  PHQ - 2 Score 0 0    No flowsheet data found.   Review of Systems:   Pertinent items are noted in HPI Denies fever/chills, dizziness, headaches, visual disturbances, fatigue, shortness of breath, chest pain, abdominal pain, vomiting, abnormal vaginal discharge/itching/odor/irritation, problems with periods, bowel movements, urination, or intercourse unless otherwise stated above.  Pertinent History Reviewed:  Reviewed past medical,surgical, social, obstetrical and family history.  Reviewed problem list, medications and allergies. Physical Assessment:   Vitals:   05/10/21 0903  BP: 119/70  Pulse: 88  Weight: 148 lb (67.1 kg)  Height: 5\' 4"  (1.626 m)  Body mass index is 25.4 kg/m.       Physical Examination:   General appearance: alert, well appearing, and in no distress  Mental status: alert, oriented to person, place, and time  Skin: warm & dry   Cardiovascular: normal heart rate noted  Respiratory: normal respiratory effort, no distress  Abdomen: soft, non-tender   Pelvic: VULVA: inner vulva a little erythematous VAGINA: normal appearing vagina with normal color and discharge, no lesions, CERVIX: normal appearing cervix  without discharge or lesions, UTERUS: uterus is normal size, shape, consistency and nontender, ADNEXA: normal adnexa in size, nontender and no masses  Extremities: no edema   Chaperone:    No results found for this or any previous visit (from the past 24 hour(s)).  Assessment & Plan:  1) Vulvar irritation/?frequent yeast infections per pt> will see what shows on pap and discuss when results are back  2) Screening for cervical cancer> pap today  3) Trying to conceive> start pnv, let Malachy Mood know when gets +HPT  Meds: No orders of the defined types were placed in this encounter.   No orders of the defined types were placed in this encounter.   Return in about 1 year (around 05/10/2022) for Physical.  05/12/2022 CNM, [facility] 05/10/2021 9:36 AM

## 2021-05-14 DIAGNOSIS — K219 Gastro-esophageal reflux disease without esophagitis: Secondary | ICD-10-CM | POA: Diagnosis not present

## 2021-05-14 DIAGNOSIS — R1110 Vomiting, unspecified: Secondary | ICD-10-CM | POA: Diagnosis not present

## 2021-05-14 DIAGNOSIS — R1111 Vomiting without nausea: Secondary | ICD-10-CM | POA: Diagnosis not present

## 2021-05-14 DIAGNOSIS — R12 Heartburn: Secondary | ICD-10-CM | POA: Diagnosis not present

## 2021-05-14 DIAGNOSIS — R140 Abdominal distension (gaseous): Secondary | ICD-10-CM | POA: Diagnosis not present

## 2021-05-16 ENCOUNTER — Telehealth: Payer: Self-pay | Admitting: Women's Health

## 2021-05-16 ENCOUNTER — Encounter: Payer: Self-pay | Admitting: Women's Health

## 2021-05-16 DIAGNOSIS — R87619 Unspecified abnormal cytological findings in specimens from cervix uteri: Secondary | ICD-10-CM | POA: Insufficient documentation

## 2021-05-16 DIAGNOSIS — R87612 Low grade squamous intraepithelial lesion on cytologic smear of cervix (LGSIL): Secondary | ICD-10-CM

## 2021-05-16 HISTORY — DX: Low grade squamous intraepithelial lesion on cytologic smear of cervix (LGSIL): R87.612

## 2021-05-16 NOTE — Telephone Encounter (Signed)
Patient calling wanting to know if someone will call her about her test results if you could please call her

## 2021-05-16 NOTE — Telephone Encounter (Signed)
Pt called about pap, explained HPV, and LSIl, repeat in 1 year. Has been irritated, clip hair no shaving and try astroglide not [place]

## 2021-05-21 ENCOUNTER — Encounter: Payer: Self-pay | Admitting: Women's Health

## 2021-05-21 LAB — CYTOLOGY - PAP
Chlamydia: NEGATIVE
Comment: NEGATIVE
Comment: NEGATIVE
Comment: NEGATIVE
Comment: NORMAL
HPV 16: NEGATIVE
HPV 18 / 45: NEGATIVE
High risk HPV: POSITIVE — AB
Neisseria Gonorrhea: NEGATIVE

## 2021-05-31 DIAGNOSIS — K219 Gastro-esophageal reflux disease without esophagitis: Secondary | ICD-10-CM | POA: Diagnosis not present

## 2021-06-03 ENCOUNTER — Telehealth (INDEPENDENT_AMBULATORY_CARE_PROVIDER_SITE_OTHER): Payer: Self-pay | Admitting: Gastroenterology

## 2021-06-03 DIAGNOSIS — K2100 Gastro-esophageal reflux disease with esophagitis, without bleeding: Secondary | ICD-10-CM

## 2021-06-03 MED ORDER — DEXLANSOPRAZOLE 60 MG PO CPDR: 60.0000 mg | DELAYED_RELEASE_CAPSULE | Freq: Two times a day (BID) | ORAL | 1 refills | Status: DC

## 2021-06-03 NOTE — Telephone Encounter (Signed)
Spoke with the patient today regarding the results of her most recent esophageal manometry and pH impedance testing performed on 05/14/2021 at [facility].  Esophageal manometry showed presence of some fractioned or weak swallows but overall it was considered a normal study.  The pH impedance testing on PPI twice a day showed a 10.9% acid exposure with a DeMeester score of 46.4, total of 104 reflux events were seen in 92% symptom association probability.  Thus, the patient was still presenting active reflux despite taking PPI compliantly.  I explained to the patient the different options which included changing to another PPI versus TIF versus Nissen fundoplication with potential hiatal hernia correction.  The patient would like to try another PPI, will try Dexilant at max dose.  Hi Mitzie,  Can you please schedule a follow up appointment for this patient in 6 months?  Thanks,  [name], MD Gastroenterology and Hepatology [facility]

## 2021-06-07 ENCOUNTER — Other Ambulatory Visit: Payer: Self-pay

## 2021-06-07 ENCOUNTER — Encounter: Payer: Self-pay | Admitting: Women's Health

## 2021-06-07 ENCOUNTER — Other Ambulatory Visit (HOSPITAL_COMMUNITY)
Admission: RE | Admit: 2021-06-07 | Discharge: 2021-06-07 | Disposition: A | Discharge status: Home / Self Care | Payer: BC Managed Care – PPO | Source: Ambulatory Visit | Attending: Women's Health | Admitting: Women's Health

## 2021-06-07 ENCOUNTER — Ambulatory Visit (INDEPENDENT_AMBULATORY_CARE_PROVIDER_SITE_OTHER): Payer: BC Managed Care – PPO | Admitting: Women's Health

## 2021-06-07 VITALS — BP 104/63 | HR 75 | Wt 151.0 lb

## 2021-06-07 DIAGNOSIS — Z3202 Encounter for pregnancy test, result negative: Secondary | ICD-10-CM | POA: Diagnosis not present

## 2021-06-07 DIAGNOSIS — R87612 Low grade squamous intraepithelial lesion on cytologic smear of cervix (LGSIL): Secondary | ICD-10-CM | POA: Insufficient documentation

## 2021-06-07 DIAGNOSIS — N870 Mild cervical dysplasia: Secondary | ICD-10-CM | POA: Diagnosis not present

## 2021-06-07 LAB — POCT URINE PREGNANCY: Preg Test, Ur: NEGATIVE

## 2021-06-07 NOTE — Patient Instructions (Signed)
Colposcopy, Care After ?The following information offers guidance on how to care for yourself after your procedure. Your health care provider may also give you more specific instructions. If you have problems or questions, contact your health care provider. ?What can I expect after the procedure? ?If you had a colposcopy without a biopsy, you can expect to feel fine right away after your procedure. However, you may have some spotting of blood for a few days. You can return to your normal activities. ?If you had a colposcopy with a biopsy, it is common after the procedure to have: ?Soreness and mild pain. These may last for a few days. ?Mild vaginal bleeding or discharge that is dark-colored and grainy. This may last for a few days. The discharge may be caused by a liquid (solution) that was used during the procedure. You may need to wear a sanitary pad during this time. ?Spotting of blood for at least 48 hours after the procedure. ?Follow these instructions at home: ?Medicines ?Take over-the-counter and prescription medicines only as told by your health care provider. ?Talk with your health care provider about what type of over-the-counter pain medicines and prescription medicines you can start to take again. It is especially important to talk with your health care provider if you take blood thinners. ?Activity ?Avoid using douche products, using tampons, and having sex for at least 3 days after the procedure or for as long as told by your health care provider. ?Return to your normal activities as told by your health care provider. Ask your health care provider what activities are safe for you. ?General instructions ?Ask your health care provider if you may take baths, swim, or use a hot tub. You may take showers. ?If you use birth control (contraception), continue to use it. ?Keep all follow-up visits. This is important. ?Contact a health care provider if: ?You have a fever or chills. ?You faint or feel  light-headed. ?Get help right away if: ?You have heavy bleeding from your vagina or pass blood clots. Heavy bleeding is bleeding that soaks through a sanitary pad in less than 1 hour. ?You have vaginal discharge that is abnormal, is yellow in color, or smells bad. This could be a sign of infection. ?You have severe pain or cramps in your lower abdomen that do not go away with medicine. ?Summary ?If you had a colposcopy without a biopsy, you can expect to feel fine right away, but you may have some spotting of blood for a few days. You can return to your normal activities. ?If you had a colposcopy with a biopsy, it is common to have mild pain for a few days and spotting for 48 hours after the procedure. ?Avoid using douche products, using tampons, and having sex for at least 3 days after the procedure or for as long as told by your health care provider. ?Get help right away if you have heavy bleeding, severe pain, or signs of infection. ?This information is not intended to replace advice given to you by your health care provider. Make sure you discuss any questions you have with your health care provider. ?Document Revised: 09/02/2020 Document Reviewed: 09/02/2020 ?Elsevier Patient Education ? 2022 Elsevier Inc. ? ?

## 2021-06-07 NOTE — Addendum Note (Signed)
Addended by: Moss Mc on: 06/07/2021 01:38 PM   Modules accepted: Orders

## 2021-06-07 NOTE — Progress Notes (Signed)
° °  COLPOSCOPY PROCEDURE NOTE Patient name: Megan Tran MRN [id number]  Date of birth: July 07, 1990 Subjective Findings:   Megan Tran is a 31 y.o. G47P1001 Caucasian female being seen today for a colposcopy. Indication: Abnormal pap on 05/10/21: LSIL w/ HRHPV positive: other (not 16, 18/45)  Prior cytology:  Date Result Procedure  04/19/18 NILM w/ HRHPV not done None              Patient's last menstrual period was 06/01/2021. Contraception: none. Menopausal: no. Hysterectomy: no.   Smoker: former . Immunocompromised: no.   The risks and benefits were explained and informed consent was obtained, and written copy is in chart. Pertinent History Reviewed:   Reviewed past medical,surgical, social, obstetrical and family history.  Reviewed problem list, medications and allergies. Objective Findings & Procedure:   Vitals:   06/07/21 1228  BP: 104/63  Pulse: 75  Weight: 151 lb (68.5 kg)  Body mass index is 25.92 kg/m.  No results found for this or any previous visit (from the past 24 hour(s)).   Time out was performed.  Speculum placed in the vagina, cervix fully visualized. SCJ: fully visualized. Cervix swabbed x 3 with acetic acid.  Acetowhitening present: Yes Cervix: no visible lesions, no mosaicism, no punctation, no abnormal vasculature, and acetowhite lesion(s) noted at 7 & 1 o'clock. Cervical biopsies taken at 7 & 1 o'clock and Hemostasis achieved with Monsel's solution. Vagina: vaginal colposcopy not performed Vulva: vulvar colposcopy not performed  Specimens: 2  Complications: none  Chaperone: Jobe Marker    Colposcopic Impression & Plan:   Colposcopy findings consistent with LSIL Plan: Post biopsy instructions given, Will notify patient of results when back, and Will base plan of care on pathology results and ASCCP guidelines  Return in about 1 year (around 06/07/2022) for Physical.  Cheral Marker CNM, WHNP-BC 06/07/2021 1:00 PM

## 2021-06-11 LAB — SURGICAL PATHOLOGY

## 2021-06-13 ENCOUNTER — Telehealth (INDEPENDENT_AMBULATORY_CARE_PROVIDER_SITE_OTHER): Payer: Self-pay | Admitting: Gastroenterology

## 2021-06-13 DIAGNOSIS — K219 Gastro-esophageal reflux disease without esophagitis: Secondary | ICD-10-CM

## 2021-06-13 MED ORDER — ESOMEPRAZOLE MAGNESIUM 40 MG PO CPDR: 40.0000 mg | DELAYED_RELEASE_CAPSULE | Freq: Two times a day (BID) | ORAL | 1 refills | Status: DC

## 2021-06-13 NOTE — Telephone Encounter (Signed)
Dexilant is not part of the medications are covered by her insurance.  Can you please let her know that I will send Nexium at the highest dose (40 mg twice a day)?  Needs to take each pill at least 30 minutes before each breakfast and supper.  [name], MD Gastroenterology and Hepatology [facility]

## 2021-06-13 NOTE — Telephone Encounter (Signed)
Patient aware of all.

## 2021-07-01 ENCOUNTER — Encounter: Payer: Self-pay | Admitting: Women's Health

## 2021-08-07 ENCOUNTER — Ambulatory Visit (INDEPENDENT_AMBULATORY_CARE_PROVIDER_SITE_OTHER): Payer: BC Managed Care – PPO | Admitting: Advanced Practice Midwife

## 2021-08-07 ENCOUNTER — Encounter: Payer: Self-pay | Admitting: Advanced Practice Midwife

## 2021-08-07 ENCOUNTER — Other Ambulatory Visit (HOSPITAL_COMMUNITY)
Admission: RE | Admit: 2021-08-07 | Discharge: 2021-08-07 | Disposition: A | Discharge status: Home / Self Care | Payer: BC Managed Care – PPO | Source: Ambulatory Visit | Attending: Advanced Practice Midwife | Admitting: Advanced Practice Midwife

## 2021-08-07 VITALS — BP 117/72 | HR 95 | Ht 64.0 in | Wt 154.0 lb

## 2021-08-07 DIAGNOSIS — N898 Other specified noninflammatory disorders of vagina: Secondary | ICD-10-CM | POA: Diagnosis not present

## 2021-08-07 NOTE — Progress Notes (Signed)
? ?  GYN VISIT ?Patient name: Megan Tran MRN [id number]  Date of birth: 03-24-1991 ?Chief Complaint:   ?Vaginal Discharge ? ?History of Present Illness:   ?Megan Tran is a 31 y.o. G56P1001 Caucasian female being seen today for vag irritation since Jan; worse just before cycle and with ovulation. Tried Monistat recently with good relief, but symptoms are back.    ?Patient's last menstrual period was 07/23/2021 (exact date). ?The current method of family planning is none.  ?Last pap (colpo Feb 2023 w repeat cytology in Feb 2024). Results were: LSIL w/ HRHPV positive: other (not 16, 18/45) ? ? ?  04/19/2018  ?  4:07 PM 03/23/2018  ?  4:25 PM  ?Depression screen PHQ 2/9  ?Decreased Interest 0 0  ?Down, Depressed, Hopeless 0 0  ?PHQ - 2 Score 0 0  ? ?  ?   ? View : No data to display.  ?  ?  ?  ? ? ? ?Review of Systems:   ?Pertinent items are noted in HPI ?Denies fever/chills, dizziness, headaches, visual disturbances, fatigue, shortness of breath, chest pain, abdominal pain, vomiting, abnormal vaginal discharge/itching/odor/irritation, problems with periods, bowel movements, urination, or intercourse unless otherwise stated above.  ?Pertinent History Reviewed:  ?Reviewed past medical,surgical, social, obstetrical and family history.  ?Reviewed problem list, medications and allergies. ?Physical Assessment:  ? ?Vitals:  ? 08/07/21 1434  ?BP: 117/72  ?Pulse: 95  ?Weight: 154 lb (69.9 kg)  ?Height: 5\' 4"  (1.626 m)  ?Body mass index is 26.43 kg/m?. ? ?     Physical Examination:  ? General appearance: alert, well appearing, and in no distress ? Mental status: alert, oriented to person, place, and time ? Skin: warm & dry  ? Cardiovascular: normal heart rate noted ? Respiratory: normal respiratory effort, no distress ? Abdomen: soft, non-tender  ? Pelvic: normal external genitalia, vulva, vagina, cervix, uterus and adnexa ? Extremities: no edema  ? ?Chaperone: Neas   ? ?No results found for this or any previous  visit (from the past 24 hour(s)).  ?Assessment & Plan:  ?1) Vag discharge> think it may be yeast based on symptoms; discussed possibly taking Diflucan twice a month at times when symptoms are worse vs trying boric acid vag suppositories; will wait to see results of CV swab first ? ? ?Meds: No orders of the defined types were placed in this encounter. ? ? ?No orders of the defined types were placed in this encounter. ? ? ?Return for Pap Feb 2024 with KB. ? ?Mar 2024 CNM ?08/07/2021 ?3:30 PM  ?

## 2021-08-09 ENCOUNTER — Other Ambulatory Visit: Payer: Self-pay | Admitting: Advanced Practice Midwife

## 2021-08-09 LAB — CERVICOVAGINAL ANCILLARY ONLY
Bacterial Vaginitis (gardnerella): POSITIVE — AB
Candida Glabrata: NEGATIVE
Candida Vaginitis: NEGATIVE
Comment: NEGATIVE
Comment: NEGATIVE
Comment: NEGATIVE

## 2021-08-09 MED ORDER — METRONIDAZOLE 500 MG PO TABS: 500.0000 mg | ORAL_TABLET | Freq: Two times a day (BID) | ORAL | 0 refills | Status: DC

## 2021-09-09 ENCOUNTER — Ambulatory Visit (INDEPENDENT_AMBULATORY_CARE_PROVIDER_SITE_OTHER): Payer: BC Managed Care – PPO | Admitting: Adult Health

## 2021-09-09 ENCOUNTER — Encounter: Payer: Self-pay | Admitting: Adult Health

## 2021-09-09 ENCOUNTER — Other Ambulatory Visit (HOSPITAL_COMMUNITY)
Admission: RE | Admit: 2021-09-09 | Discharge: 2021-09-09 | Disposition: A | Discharge status: Home / Self Care | Payer: BC Managed Care – PPO | Source: Ambulatory Visit | Attending: Adult Health | Admitting: Adult Health

## 2021-09-09 VITALS — BP 115/75 | HR 81 | Ht 64.5 in | Wt 151.5 lb

## 2021-09-09 DIAGNOSIS — Z113 Encounter for screening for infections with a predominantly sexual mode of transmission: Secondary | ICD-10-CM | POA: Diagnosis not present

## 2021-09-09 DIAGNOSIS — N898 Other specified noninflammatory disorders of vagina: Secondary | ICD-10-CM | POA: Insufficient documentation

## 2021-09-09 MED ORDER — FLUCONAZOLE 150 MG PO TABS: ORAL_TABLET | ORAL | 1 refills | Status: DC

## 2021-09-09 NOTE — Progress Notes (Signed)
  Subjective:     Patient ID: Megan Tran, female   DOB: 09/02/1990, 31 y.o.   MRN: [id number]  HPI Megan Tran is a 31 year old white female,single, G1P1 in complaining of vaginal discharge and itching, was on antibiotics recently for sinus infection.  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  Review of Systems Has vaginal discharge and itching Finished  antibiotic  Reviewed past medical,surgical, social and family history. Reviewed medications and allergies.     Objective:   Physical Exam BP 115/75 (BP Location: Left Arm, Patient Position: Sitting, Cuff Size: Normal)   Pulse 81   Ht 5' 4.5" (1.638 m)   Wt 151 lb 8 oz (68.7 kg)   LMP 08/15/2021   BMI 25.60 kg/m     Skin warm and dry.Pelvic: external genitalia is normal in appearance no lesions, vagina: white,clumpy discharge without odor,urethra has no lesions or masses noted, cervix:smooth and bulbous, uterus: normal size, shape and contour, non tender, no masses felt, adnexa: no masses or tenderness noted. Bladder is non tender and no masses felt. CV swab obtained.  Fall risk is low  Upstream - 09/09/21 1448       Pregnancy Intention Screening   Does the patient want to become pregnant in the next year? Yes    Does the patient's partner want to become pregnant in the next year? Yes    Would the patient like to discuss contraceptive options today? No      Contraception Wrap Up   Current Method Female Condom    End Method Pregnant/Seeking Pregnancy             Assessment:     1. Vaginal discharge CV swab sent for BV and yeast  - Cervicovaginal ancillary only( [place])  2. Vaginal itching CV swab sent - Cervicovaginal ancillary only( [place]) Finished antibiotic for sinus infection  Will rx diflucan  Meds ordered this encounter  Medications   fluconazole (DIFLUCAN) 150 MG tablet     Sig: Take 1 now and 1 in 3 days if needed    Dispense:  2 tablet    Refill:  1    Order Specific Question:   Supervising Provider    Answer:   EURE, LUTHER H [[id number]]    3. Screening examination for STD (sexually transmitted disease) Check labs  - Hepatitis C antibody - HIV Antibody (routine testing w rflx) - RPR - Hepatitis B surface antigen     Plan:    Take OTC PNV Follow up prn

## 2021-09-10 LAB — HIV ANTIBODY (ROUTINE TESTING W REFLEX): HIV Screen 4th Generation wRfx: NONREACTIVE

## 2021-09-10 LAB — RPR: RPR Ser Ql: NONREACTIVE

## 2021-09-10 LAB — HEPATITIS C ANTIBODY: Hep C Virus Ab: NONREACTIVE

## 2021-09-10 LAB — HEPATITIS B SURFACE ANTIGEN: Hepatitis B Surface Ag: NEGATIVE

## 2021-09-11 LAB — CERVICOVAGINAL ANCILLARY ONLY
Bacterial Vaginitis (gardnerella): POSITIVE — AB
Candida Glabrata: NEGATIVE
Candida Vaginitis: POSITIVE — AB
Comment: NEGATIVE
Comment: NEGATIVE
Comment: NEGATIVE

## 2021-09-12 ENCOUNTER — Telehealth: Payer: Self-pay

## 2021-09-12 MED ORDER — METRONIDAZOLE 500 MG PO TABS: 500.0000 mg | ORAL_TABLET | Freq: Two times a day (BID) | ORAL | 0 refills | Status: DC

## 2021-09-12 NOTE — Telephone Encounter (Signed)
PT CALLED AND STATED THAT SHE WANTS TO TALK TO SOMEONE ABOUT HER TEST RESULTS. ?

## 2021-09-12 NOTE — Telephone Encounter (Signed)
Spoke with patient. She was treated for yeast at her visit but also has BV. Flagyl sent in per Dr. Charlotta Newton. Pt has no other questions at this time.

## 2021-09-30 ENCOUNTER — Other Ambulatory Visit (INDEPENDENT_AMBULATORY_CARE_PROVIDER_SITE_OTHER): Payer: BC Managed Care – PPO

## 2021-09-30 DIAGNOSIS — R300 Dysuria: Secondary | ICD-10-CM | POA: Diagnosis not present

## 2021-09-30 LAB — POCT URINALYSIS DIPSTICK OB
Blood, UA: NEGATIVE
Glucose, UA: NEGATIVE
Ketones, UA: NEGATIVE
Leukocytes, UA: NEGATIVE
Nitrite, UA: NEGATIVE
POC,PROTEIN,UA: NEGATIVE

## 2021-09-30 NOTE — Progress Notes (Signed)
   NURSE VISIT- UTI SYMPTOMS   SUBJECTIVE:  Megan Tran is a 31 y.o. G75P1001 female here for UTI symptoms. She is a GYN patient. She reports dysuria.  OBJECTIVE:  LMP 08/15/2021   Appears well, in no apparent distress  No results found for this or any previous visit (from the past 24 hour(s)).  ASSESSMENT: GYN patient with UTI symptoms and negative nitrites  PLAN: Note routed to Dr. Despina Hidden   Rx sent by provider today: No Urine culture sent Call or return to clinic prn if these symptoms worsen or fail to improve as anticipated. Follow-up: as needed   [name]  09/30/2021 2:47 PM

## 2021-10-02 LAB — URINE CULTURE

## 2021-10-03 ENCOUNTER — Telehealth: Payer: Self-pay

## 2021-10-03 ENCOUNTER — Other Ambulatory Visit: Payer: Self-pay | Admitting: Obstetrics & Gynecology

## 2021-10-03 ENCOUNTER — Encounter: Payer: Self-pay | Admitting: *Deleted

## 2021-10-03 MED ORDER — SULFAMETHOXAZOLE-TRIMETHOPRIM 800-160 MG PO TABS: 1.0000 | ORAL_TABLET | Freq: Two times a day (BID) | ORAL | 0 refills | Status: DC

## 2021-10-03 NOTE — Telephone Encounter (Signed)
Pt called and stated that she would like something called in for her UTI.

## 2021-10-04 ENCOUNTER — Telehealth: Payer: Self-pay

## 2021-10-04 NOTE — Telephone Encounter (Signed)
Patient states she has been prescribed Bactrim for a UTI and was told by the pharmacist that if she was pregnant not to take them.  Patient states she is trying to conceive and should start her cycle Monday. Discussed with JAG and patient advised she can take the medication. Only contraindicated in third trimester.  Pt verbalized understanding with no further questions.

## 2021-10-04 NOTE — Telephone Encounter (Signed)
Pt called and stated that she would like to speak to someone about the antibiotic that she was prescribed.

## 2021-11-27 ENCOUNTER — Ambulatory Visit (INDEPENDENT_AMBULATORY_CARE_PROVIDER_SITE_OTHER): Payer: BC Managed Care – PPO | Admitting: *Deleted

## 2021-11-27 VITALS — BP 124/76 | HR 98 | Ht 64.0 in | Wt 154.0 lb

## 2021-11-27 DIAGNOSIS — N926 Irregular menstruation, unspecified: Secondary | ICD-10-CM

## 2021-11-27 DIAGNOSIS — Z3201 Encounter for pregnancy test, result positive: Secondary | ICD-10-CM

## 2021-11-27 LAB — POCT URINE PREGNANCY: Preg Test, Ur: POSITIVE — AB

## 2021-11-27 NOTE — Progress Notes (Signed)
   NURSE VISIT- PREGNANCY CONFIRMATION   SUBJECTIVE:  Megan Tran is a 31 y.o. G2P1001 female at [redacted]w[redacted]d by certain LMP of Patient's last menstrual period was 10/30/2021. Here for pregnancy confirmation.  Home pregnancy test: positive x 2   She reports cramping.  She is not taking prenatal vitamins.    OBJECTIVE:  BP 124/76 (BP Location: Left Arm, Patient Position: Sitting, Cuff Size: Normal)   Pulse 98   Ht 5\' 4"  (1.626 m)   Wt 154 lb (69.9 kg)   LMP 10/30/2021   BMI 26.43 kg/m   Appears well, in no apparent distress  Results for orders placed or performed in visit on 11/27/21 (from the past 24 hour(s))  POCT urine pregnancy   Collection Time: 11/27/21  3:46 PM  Result Value Ref Range   Preg Test, Ur Positive (A) Negative    ASSESSMENT: Positive pregnancy test, [redacted]w[redacted]d by LMP    PLAN: Schedule for dating ultrasound in 3-4 weeks Prenatal vitamins: plans to begin OTC ASAP   Nausea medicines: not currently needed   OB packet given: Yes  [redacted]w[redacted]d  11/27/2021 3:46 PM

## 2021-12-02 ENCOUNTER — Ambulatory Visit (INDEPENDENT_AMBULATORY_CARE_PROVIDER_SITE_OTHER): Payer: BC Managed Care – PPO | Admitting: Gastroenterology

## 2021-12-02 ENCOUNTER — Encounter: Payer: Self-pay | Admitting: Women's Health

## 2021-12-19 ENCOUNTER — Telehealth: Payer: Self-pay | Admitting: *Deleted

## 2021-12-19 ENCOUNTER — Other Ambulatory Visit (INDEPENDENT_AMBULATORY_CARE_PROVIDER_SITE_OTHER): Payer: Self-pay | Admitting: Gastroenterology

## 2021-12-19 DIAGNOSIS — K219 Gastro-esophageal reflux disease without esophagitis: Secondary | ICD-10-CM

## 2021-12-19 NOTE — Telephone Encounter (Signed)
Pt reports that she noticed some bleeding after a bowel movement. She also had bleeding earlier this week. Advised that this can happen sometimes in early pregnancy. Pt very worried and is going out of town next week. Advised that if bleeding becomes heavy or she has pain to go to [facility]. Pt doesn't want to go to ER but requesting to be seen. Appt available with Philipp Deputy tomorrow.

## 2021-12-20 ENCOUNTER — Encounter: Payer: Self-pay | Admitting: Advanced Practice Midwife

## 2021-12-20 ENCOUNTER — Ambulatory Visit (INDEPENDENT_AMBULATORY_CARE_PROVIDER_SITE_OTHER): Payer: BC Managed Care – PPO | Admitting: Advanced Practice Midwife

## 2021-12-20 DIAGNOSIS — O209 Hemorrhage in early pregnancy, unspecified: Secondary | ICD-10-CM | POA: Diagnosis not present

## 2021-12-20 NOTE — Progress Notes (Signed)
   GYN VISIT Patient name: Megan Tran MRN [id number]  Date of birth: [birth year] Chief Complaint:   brown discharge started Tuesday, light pink yesterday  History of Present Illness:   Megan Tran is a 31 y.o. G53P1001 Caucasian female being seen today for previous brown vag d/c but today was increased and more pink in color w wiping; of note, happened after straining w BM.    Patient's last menstrual period was 10/30/2021.  Last pap Jan 2023. Results were: LSIL w/ HRHPV positive: other (not 16, 18/45)     04/19/2018    4:07 PM 03/23/2018    4:25 PM  Depression screen PHQ 2/9  Decreased Interest 0 0  Down, Depressed, Hopeless 0 0  PHQ - 2 Score 0 0         No data to display           Review of Systems:   Pertinent items are noted in HPI Denies fever/chills, dizziness, headaches, visual disturbances, fatigue, shortness of breath, chest pain, abdominal pain, vomiting, abnormal vaginal discharge/itching/odor/irritation, problems with periods, bowel movements, urination, or intercourse unless otherwise stated above.  Pertinent History Reviewed:  Reviewed past medical,surgical, social, obstetrical and family history.  Reviewed problem list, medications and allergies. Physical Assessment:  There were no vitals filed for this visit.There is no height or weight on file to calculate BMI.       Physical Examination:   General appearance: alert, well appearing, and in no distress  Mental status: alert, oriented to person, place, and time  Skin: warm & dry   Cardiovascular: normal heart rate noted  Respiratory: normal respiratory effort, no distress  Abdomen: soft, non-tender   Pelvic: normal external genitalia, vulva, vagina, cervix, uterus and adnexa; transvag u/s by Dr Charlotta Newton- +fetal heart (no measurements taken)  Extremities: no edema    No results found for this or any previous visit (from the past 24 hour(s)).  Assessment & Plan:  1) Vag bleeding early preg> trans  vag u/s shows +heartbeat and FP; given pelvic rest x 1wk; keep formal u/s appt on 12/30/21   Meds: No orders of the defined types were placed in this encounter.   No orders of the defined types were placed in this encounter.   Return for As scheduled.  Arabella Merles CNM 12/20/2021 12:15 PM

## 2021-12-27 ENCOUNTER — Other Ambulatory Visit: Payer: Self-pay | Admitting: Obstetrics & Gynecology

## 2021-12-27 DIAGNOSIS — O3680X0 Pregnancy with inconclusive fetal viability, not applicable or unspecified: Secondary | ICD-10-CM

## 2021-12-30 ENCOUNTER — Ambulatory Visit (INDEPENDENT_AMBULATORY_CARE_PROVIDER_SITE_OTHER): Payer: BC Managed Care – PPO

## 2021-12-30 DIAGNOSIS — Z3A08 8 weeks gestation of pregnancy: Secondary | ICD-10-CM

## 2021-12-30 DIAGNOSIS — O3680X0 Pregnancy with inconclusive fetal viability, not applicable or unspecified: Secondary | ICD-10-CM | POA: Diagnosis not present

## 2021-12-30 NOTE — Progress Notes (Signed)
[place] 8+5 wks,single IUP with yolk sac,CRL 21.73 mm,normal ovaries,FHR 171 bpm

## 2022-01-09 ENCOUNTER — Telehealth: Payer: Self-pay

## 2022-01-09 NOTE — Telephone Encounter (Signed)
Patient called and stated that she is having trouble eating and would like for something to be called in for nausea.

## 2022-01-24 ENCOUNTER — Other Ambulatory Visit: Payer: Self-pay | Admitting: Obstetrics & Gynecology

## 2022-01-24 ENCOUNTER — Encounter: Payer: Self-pay | Admitting: Women's Health

## 2022-01-24 DIAGNOSIS — Z3682 Encounter for antenatal screening for nuchal translucency: Secondary | ICD-10-CM

## 2022-01-24 DIAGNOSIS — Z3490 Encounter for supervision of normal pregnancy, unspecified, unspecified trimester: Secondary | ICD-10-CM | POA: Insufficient documentation

## 2022-01-27 ENCOUNTER — Encounter: Payer: Self-pay | Admitting: Women's Health

## 2022-01-27 ENCOUNTER — Ambulatory Visit (INDEPENDENT_AMBULATORY_CARE_PROVIDER_SITE_OTHER): Payer: BC Managed Care – PPO | Admitting: Women's Health

## 2022-01-27 ENCOUNTER — Ambulatory Visit (INDEPENDENT_AMBULATORY_CARE_PROVIDER_SITE_OTHER): Payer: BC Managed Care – PPO

## 2022-01-27 ENCOUNTER — Ambulatory Visit: Payer: BC Managed Care – PPO | Admitting: *Deleted

## 2022-01-27 VITALS — BP 115/69 | HR 86 | Wt 150.0 lb

## 2022-01-27 DIAGNOSIS — Z3A12 12 weeks gestation of pregnancy: Secondary | ICD-10-CM

## 2022-01-27 DIAGNOSIS — Z3481 Encounter for supervision of other normal pregnancy, first trimester: Secondary | ICD-10-CM | POA: Diagnosis not present

## 2022-01-27 DIAGNOSIS — Z3480 Encounter for supervision of other normal pregnancy, unspecified trimester: Secondary | ICD-10-CM

## 2022-01-27 DIAGNOSIS — Z3682 Encounter for antenatal screening for nuchal translucency: Secondary | ICD-10-CM

## 2022-01-27 LAB — POCT URINALYSIS DIPSTICK OB
Blood, UA: NEGATIVE
Glucose, UA: NEGATIVE
Ketones, UA: NEGATIVE
Leukocytes, UA: NEGATIVE
Nitrite, UA: NEGATIVE
POC,PROTEIN,UA: NEGATIVE

## 2022-01-27 MED ORDER — PROMETHAZINE HCL 25 MG PO TABS: 12.5000 mg | ORAL_TABLET | Freq: Four times a day (QID) | ORAL | 6 refills | Status: DC | PRN

## 2022-01-27 NOTE — Patient Instructions (Signed)
[place], thank you for choosing our office today! We appreciate the opportunity to meet your healthcare needs. You may receive a short survey by mail, e-mail, or through [employer]. If you are happy with your care we would appreciate if you could take just a few minutes to complete the survey questions. We read all of your comments and take your feedback very seriously. Thank you again for choosing our office.  [facility] at [place] at [facility] ([place], [place] [address]) Entrance C, located off of [address] parking   Nausea & Vomiting Have saltine crackers or pretzels by your bed and eat a few bites before you raise your head out of bed in the morning Eat small frequent meals throughout the day instead of large meals Drink plenty of fluids throughout the day to stay hydrated, just don't drink a lot of fluids with your meals.  This can make your stomach fill up faster making you feel sick Do not brush your teeth right after you eat Products with real ginger are good for nausea, like ginger ale and ginger hard candy Make sure it says made with real ginger! Sucking on sour candy like lemon heads is also good for nausea If your prenatal vitamins make you nauseated, take them at night so you will sleep through the nausea Sea Bands If you feel like you need medicine for the nausea & vomiting please let us know If you are unable to keep any fluids or food down please let us know   Constipation Drink plenty of fluid, preferably water, throughout the day Eat foods high in fiber such as fruits, vegetables, and grains Exercise, such as walking, is a good way to keep your bowels regular Drink warm fluids, especially warm prune juice, or decaf coffee Eat a 1/2 cup of real oatmeal (not instant), 1/2 cup applesauce, and 1/2-1 cup warm prune juice every day If needed, you may take Colace (docusate sodium) stool  softener once or twice a day to help keep the stool soft.  If you still are having problems with constipation, you may take Miralax once daily as needed to help keep your bowels regular.   Home Blood Pressure Monitoring for Patients   Your provider has recommended that you check your blood pressure (BP) at least once a week at home. If you do not have a blood pressure cuff at home, one will be provided for you. Contact your provider if you have not received your monitor within 1 week.   Helpful Tips for Accurate Home Blood Pressure Checks  Don't smoke, exercise, or drink caffeine 30 minutes before checking your BP Use the restroom before checking your BP (a full bladder can raise your pressure) Relax in a comfortable upright chair Feet on the ground Left arm resting comfortably on a flat surface at the level of your heart Legs uncrossed Back supported Sit quietly and don't talk Place the cuff on your bare arm Adjust snuggly, so that only two fingertips can fit between your skin and the top of the cuff Check 2 readings separated by at least one minute Keep a log of your BP readings For a visual, please reference this diagram: http://ccnc.care/bpdiagram  Provider Name: [facility]     Phone: [phone]  Zone 1: ALL CLEAR  Continue to monitor your symptoms:  BP reading is less than 140 (top number) or less than 90 (bottom  number)  No right upper stomach pain No headaches or seeing spots No feeling nauseated or throwing up No swelling in face and hands  Zone 2: CAUTION Call your doctor's office for any of the following:  BP reading is greater than 140 (top number) or greater than 90 (bottom number)  Stomach pain under your ribs in the middle or right side Headaches or seeing spots Feeling nauseated or throwing up Swelling in face and hands  Zone 3: EMERGENCY  Seek immediate medical care if you have any of the following:  BP reading is greater than160 (top number) or  greater than 110 (bottom number) Severe headaches not improving with Tylenol Serious difficulty catching your breath Any worsening symptoms from Zone 2    First Trimester of Pregnancy The first trimester of pregnancy is from week 1 until the end of week 12 (months 1 through 3). A week after a sperm fertilizes an egg, the egg will implant on the wall of the uterus. This embryo will begin to develop into a baby. Genes from you and your partner are forming the baby. The female genes determine whether the baby is a boy or a girl. At 6-8 weeks, the eyes and face are formed, and the heartbeat can be seen on ultrasound. At the end of 12 weeks, all the baby's organs are formed.  Now that you are pregnant, you will want to do everything you can to have a healthy baby. Two of the most important things are to get good prenatal care and to follow your health care provider's instructions. Prenatal care is all the medical care you receive before the baby's birth. This care will help prevent, find, and treat any problems during the pregnancy and childbirth. BODY CHANGES Your body goes through many changes during pregnancy. The changes vary from woman to woman.  You may gain or lose a couple of pounds at first. You may feel sick to your stomach (nauseous) and throw up (vomit). If the vomiting is uncontrollable, call your health care provider. You may tire easily. You may develop headaches that can be relieved by medicines approved by your health care provider. You may urinate more often. Painful urination may mean you have a bladder infection. You may develop heartburn as a result of your pregnancy. You may develop constipation because certain hormones are causing the muscles that push waste through your intestines to slow down. You may develop hemorrhoids or swollen, bulging veins (varicose veins). Your breasts may begin to grow larger and become tender. Your nipples may stick out more, and the tissue that  surrounds them (areola) may become darker. Your gums may bleed and may be sensitive to brushing and flossing. Dark spots or blotches (chloasma, mask of pregnancy) may develop on your face. This will likely fade after the baby is born. Your menstrual periods will stop. You may have a loss of appetite. You may develop cravings for certain kinds of food. You may have changes in your emotions from day to day, such as being excited to be pregnant or being concerned that something may go wrong with the pregnancy and baby. You may have more vivid and strange dreams. You may have changes in your hair. These can include thickening of your hair, rapid growth, and changes in texture. Some women also have hair loss during or after pregnancy, or hair that feels dry or thin. Your hair will most likely return to normal after your baby is born. WHAT TO EXPECT AT YOUR PRENATAL  VISITS During a routine prenatal visit: You will be weighed to make sure you and the baby are growing normally. Your blood pressure will be taken. Your abdomen will be measured to track your baby's growth. The fetal heartbeat will be listened to starting around week 10 or 12 of your pregnancy. Test results from any previous visits will be discussed. Your health care provider may ask you: How you are feeling. If you are feeling the baby move. If you have had any abnormal symptoms, such as leaking fluid, bleeding, severe headaches, or abdominal cramping. If you have any questions. Other tests that may be performed during your first trimester include: Blood tests to find your blood type and to check for the presence of any previous infections. They will also be used to check for low iron levels (anemia) and Rh antibodies. Later in the pregnancy, blood tests for diabetes will be done along with other tests if problems develop. Urine tests to check for infections, diabetes, or protein in the urine. An ultrasound to confirm the proper growth  and development of the baby. An amniocentesis to check for possible genetic problems. Fetal screens for spina bifida and Down syndrome. You may need other tests to make sure you and the baby are doing well. HOME CARE INSTRUCTIONS  Medicines Follow your health care provider's instructions regarding medicine use. Specific medicines may be either safe or unsafe to take during pregnancy. Take your prenatal vitamins as directed. If you develop constipation, try taking a stool softener if your health care provider approves. Diet Eat regular, well-balanced meals. Choose a variety of foods, such as meat or vegetable-based protein, fish, milk and low-fat dairy products, vegetables, fruits, and whole grain breads and cereals. Your health care provider will help you determine the amount of weight gain that is right for you. Avoid raw meat and uncooked cheese. These carry germs that can cause birth defects in the baby. Eating four or five small meals rather than three large meals a day may help relieve nausea and vomiting. If you start to feel nauseous, eating a few soda crackers can be helpful. Drinking liquids between meals instead of during meals also seems to help nausea and vomiting. If you develop constipation, eat more high-fiber foods, such as fresh vegetables or fruit and whole grains. Drink enough fluids to keep your urine clear or pale yellow. Activity and Exercise Exercise only as directed by your health care provider. Exercising will help you: Control your weight. Stay in shape. Be prepared for labor and delivery. Experiencing pain or cramping in the lower abdomen or low back is a good sign that you should stop exercising. Check with your health care provider before continuing normal exercises. Try to avoid standing for long periods of time. Move your legs often if you must stand in one place for a long time. Avoid heavy lifting. Wear low-heeled shoes, and practice good posture. You may  continue to have sex unless your health care provider directs you otherwise. Relief of Pain or Discomfort Wear a good support bra for breast tenderness.   Take warm sitz baths to soothe any pain or discomfort caused by hemorrhoids. Use hemorrhoid cream if your health care provider approves.   Rest with your legs elevated if you have leg cramps or low back pain. If you develop varicose veins in your legs, wear support hose. Elevate your feet for 15 minutes, 3-4 times a day. Limit salt in your diet. Prenatal Care Schedule your prenatal visits by the  twelfth week of pregnancy. They are usually scheduled monthly at first, then more often in the last 2 months before delivery. Write down your questions. Take them to your prenatal visits. Keep all your prenatal visits as directed by your health care provider. Safety Wear your seat belt at all times when driving. Make a list of emergency phone numbers, including numbers for family, friends, the hospital, and police and fire departments. General Tips Ask your health care provider for a referral to a local prenatal education class. Begin classes no later than at the beginning of month 6 of your pregnancy. Ask for help if you have counseling or nutritional needs during pregnancy. Your health care provider can offer advice or refer you to specialists for help with various needs. Do not use hot tubs, steam rooms, or saunas. Do not douche or use tampons or scented sanitary pads. Do not cross your legs for long periods of time. Avoid cat litter boxes and soil used by cats. These carry germs that can cause birth defects in the baby and possibly loss of the fetus by miscarriage or stillbirth. Avoid all smoking, herbs, alcohol, and medicines not prescribed by your health care provider. Chemicals in these affect the formation and growth of the baby. Schedule a dentist appointment. At home, brush your teeth with a soft toothbrush and be gentle when you floss. SEEK  MEDICAL CARE IF:  You have dizziness. You have mild pelvic cramps, pelvic pressure, or nagging pain in the abdominal area. You have persistent nausea, vomiting, or diarrhea. You have a bad smelling vaginal discharge. You have pain with urination. You notice increased swelling in your face, hands, legs, or ankles. SEEK IMMEDIATE MEDICAL CARE IF:  You have a fever. You are leaking fluid from your vagina. You have spotting or bleeding from your vagina. You have severe abdominal cramping or pain. You have rapid weight gain or loss. You vomit blood or material that looks like coffee grounds. You are exposed to [place] measles and have never had them. You are exposed to fifth disease or chickenpox. You develop a severe headache. You have shortness of breath. You have any kind of trauma, such as from a fall or a car accident. Document Released: 04/01/2001 Document Revised: 08/22/2013 Document Reviewed: 02/15/2013 [facility] Patient Information 2015 [place], [place]. This information is not intended to replace advice given to you by your health care provider. Make sure you discuss any questions you have with your health care provider.

## 2022-01-27 NOTE — Progress Notes (Signed)
INITIAL OBSTETRICAL VISIT Patient name: Megan Tran MRN [id number]  Date of birth: 09-Jan-1991 Chief Complaint:   Initial Prenatal Visit ("Heart skips beats")  History of Present Illness:   Megan Tran is a 31 y.o. G57P1001 Caucasian female at [redacted]w[redacted]d by LMP c/w u/s at 8 weeks with an Estimated Date of Delivery: 08/06/22 being seen today for her initial obstetrical visit.   Patient's last menstrual period was 10/30/2021. Her obstetrical history is significant for  term SVB x 1, 4th degree lac, baby 7lb14oz .   Today she reports  decreased appetite and N/V, reflux. Takes nexium 40mg  BID- has bad reflux, helped some when not pregnant but now not working at all. Taking TUMS as well. Has appt w/ GI at end of month. Also has IBS.  Last pap 05/10/21. 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, needs repeat pap 1/20 or after     01/27/2022    9:55 AM 04/19/2018    4:07 PM 03/23/2018    4:25 PM  Depression screen PHQ 2/9  Decreased Interest 0 0 0  Down, Depressed, Hopeless 0 0 0  PHQ - 2 Score 0 0 0  Altered sleeping 0    Tired, decreased energy 1    Change in appetite 1    Feeling bad or failure about yourself  0    Trouble concentrating 0    Moving slowly or fidgety/restless 0    Suicidal thoughts 0    PHQ-9 Score 2          01/27/2022    9:55 AM  GAD 7 : Generalized Anxiety Score  Nervous, Anxious, on Edge 0  Control/stop worrying 0  Worry too much - different things 1  Trouble relaxing 0  Restless 0  Easily annoyed or irritable 1  Afraid - awful might happen 0  Total GAD 7 Score 2     Review of Systems:   Pertinent items are noted in HPI Denies cramping/contractions, leakage of fluid, vaginal bleeding, abnormal vaginal discharge w/ itching/odor/irritation, headaches, visual changes, shortness of breath, chest pain, abdominal pain, severe nausea/vomiting, or problems with urination or bowel movements unless otherwise stated above.   Pertinent History Reviewed:  Reviewed past medical,surgical, social, obstetrical and family history.  Reviewed problem list, medications and allergies. OB History  Gravida Para Term Preterm AB Living  2 1 1  0 0 1  SAB IAB Ectopic Multiple Live Births  0 0 0 0 1    # Outcome Date GA Lbr Len/2nd Weight Sex Delivery Anes PTL Lv  2 Current           1 Term 06/22/16 [redacted]w[redacted]d 52:03 / 03:00 7 lb 14.5 oz (3.585 kg) F Vag-Spont EPI N LIV   Physical Assessment:   Vitals:   01/27/22 0919  BP: 115/69  Pulse: 86  There is no height or weight on file to calculate BMI.       Physical Examination:  General appearance - well appearing, and in no distress  Mental status - alert, oriented to person, place, and time  Psych:  She has a normal mood and affect  Skin - warm and dry, normal color, no suspicious lesions noted  Chest - effort normal, all lung fields clear to auscultation bilaterally  Heart - normal rate and regular rhythm  Abdomen - soft, nontender  Extremities:  No swelling or varicosities noted  Thin prep pap is not done   Chaperone: N/A  TODAY'S NT [place] 12+5 wks,measurements c/w dates,CRL 68.78 mm,NB present,NT 1.8 mm,normal ovaries,posterior placenta,FHR 157 bpm  Results for orders placed or performed in visit on 01/27/22 (from the past 24 hour(s))  POC Urinalysis Dipstick OB   Collection Time: 01/27/22 10:18 AM  Result Value Ref Range   Color, UA     Clarity, UA     Glucose, UA Negative Negative   Bilirubin, UA     Ketones, UA neg    Spec Grav, UA     Blood, UA neg    pH, UA     POC,PROTEIN,UA Negative Negative, Trace, Small (1+), Moderate (2+), Large (3+), 4+   Urobilinogen, UA     Nitrite, UA neg    Leukocytes, UA Negative Negative   Appearance     Odor      Assessment & Plan:  1) Low-Risk Pregnancy G2P1001 at [redacted]w[redacted]d with an Estimated Date of Delivery: 08/06/22   2) Initial OB visit  3) H/O 4th degree lac  4) N/V w/ decreased appetite> offered meds, wants  phenergan, rx sent  5) Reflux> not well controlled by nexium 40mg  BID, taking TUMS as well, has appt w/ GI at end of month, can check w/ them for recommendations, if defer to [place] d/t pregnancy just let us know  Meds: No orders of the defined types were placed in this encounter.   Initial labs obtained Continue prenatal vitamins Reviewed n/v relief measures and warning s/s to report Reviewed recommended weight gain based on pre-gravid BMI Encouraged well-balanced diet Genetic & carrier screening discussed: requests Panorama, NT/IT, and Horizon  Ultrasound discussed; fetal survey: requested [place] completed> form faxed if has or is planning to apply for medicaid The nature of [place] for [employer] with multiple MDs and other Advanced Practice Providers was explained to patient; also emphasized that fellows, residents, and students are part of our team. Does not have home bp cuff. Office bp cuff given: yes. Rx sent: no. Check bp weekly, let us know if consistently >140/90.   Follow-up: Return in about 4 weeks (around 02/24/2022) for [place], 2nd IT, CNM, in person; then 7wks from now for anatomy [place].   Orders Placed This Encounter  Procedures   Urine Culture   GC/Chlamydia Probe Amp   Integrated 1   CBC/D/Plt+RPR+Rh+ABO+RubIgG...   PANORAMA PRENATAL TEST FULL PANEL   HORIZON CUSTOM   POC Urinalysis Dipstick OB    Roma Schanz CNM, [facility] 01/27/2022 10:35 AM

## 2022-01-27 NOTE — Progress Notes (Signed)
[place] 12+5 wks,measurements c/w dates,CRL 68.78 mm,NB present,NT 1.8 mm,normal ovaries,posterior placenta,FHR 157 bpm

## 2022-01-29 LAB — CBC/D/PLT+RPR+RH+ABO+RUBIGG...
Antibody Screen: NEGATIVE
Basophils Absolute: 0 10*3/uL (ref 0.0–0.2)
Basos: 1 %
EOS (ABSOLUTE): 0.2 10*3/uL (ref 0.0–0.4)
Eos: 3 %
HCV Ab: NONREACTIVE
HIV Screen 4th Generation wRfx: NONREACTIVE
Hematocrit: 38.6 % (ref 34.0–46.6)
Hemoglobin: 13.4 g/dL (ref 11.1–15.9)
Hepatitis B Surface Ag: NEGATIVE
Immature Grans (Abs): 0 10*3/uL (ref 0.0–0.1)
Immature Granulocytes: 0 %
Lymphocytes Absolute: 1.5 10*3/uL (ref 0.7–3.1)
Lymphs: 21 %
MCH: 30 pg (ref 26.6–33.0)
MCHC: 34.7 g/dL (ref 31.5–35.7)
MCV: 86 fL (ref 79–97)
Monocytes Absolute: 0.4 10*3/uL (ref 0.1–0.9)
Monocytes: 6 %
Neutrophils Absolute: 4.8 10*3/uL (ref 1.4–7.0)
Neutrophils: 69 %
Platelets: 278 10*3/uL (ref 150–450)
RBC: 4.47 x10E6/uL (ref 3.77–5.28)
RDW: 13.3 % (ref 11.7–15.4)
RPR Ser Ql: NONREACTIVE
Rh Factor: POSITIVE
Rubella Antibodies, IGG: 1.28 index (ref >0.99)
WBC: 6.9 10*3/uL (ref 3.4–10.8)

## 2022-01-29 LAB — HCV INTERPRETATION

## 2022-01-29 LAB — INTEGRATED 1
Crown Rump Length: 68.8 mm
Gest. Age on Collection Date: 13 weeks
Maternal Age at EDD: 30 yr
Nuchal Translucency (NT): 1.8 mm
Number of Fetuses: 1
PAPP-A Value: 1408.5 ng/mL
Weight: 151 [lb_av]

## 2022-02-03 LAB — PANORAMA PRENATAL TEST FULL PANEL:PANORAMA TEST PLUS 5 ADDITIONAL MICRODELETIONS: FETAL FRACTION: 14.7

## 2022-02-10 LAB — HORIZON CUSTOM: REPORT SUMMARY: NEGATIVE

## 2022-02-13 ENCOUNTER — Ambulatory Visit (INDEPENDENT_AMBULATORY_CARE_PROVIDER_SITE_OTHER): Payer: BC Managed Care – PPO | Admitting: Gastroenterology

## 2022-02-13 ENCOUNTER — Encounter (INDEPENDENT_AMBULATORY_CARE_PROVIDER_SITE_OTHER): Payer: Self-pay | Admitting: Gastroenterology

## 2022-02-13 VITALS — BP 103/65 | HR 88 | Temp 98.2°F | Ht 64.0 in | Wt 152.3 lb

## 2022-02-13 DIAGNOSIS — K5900 Constipation, unspecified: Secondary | ICD-10-CM | POA: Diagnosis not present

## 2022-02-13 DIAGNOSIS — R140 Abdominal distension (gaseous): Secondary | ICD-10-CM | POA: Diagnosis not present

## 2022-02-13 DIAGNOSIS — K219 Gastro-esophageal reflux disease without esophagitis: Secondary | ICD-10-CM

## 2022-02-13 MED ORDER — PANTOPRAZOLE SODIUM 40 MG PO TBEC: 40.0000 mg | DELAYED_RELEASE_TABLET | Freq: Two times a day (BID) | ORAL | 1 refills | Status: DC

## 2022-02-13 NOTE — Progress Notes (Addendum)
Referring Provider: [name], MD Primary Care Physician:  [name], MD Primary GI Physician: Levon Hedger   Chief Complaint  Patient presents with   Gastroesophageal Reflux    Follow up on GERD. Taking nexium 40mg  bid and has to take tums as needed. Would like to discuss changing med since having some symptoms while on med. She is currently [redacted] weeks pregnant. Has had to start taking stool softners as needed since becoming pregnant.    HPI:   Megan Tran is a 31 y.o. female with past medical history of GERD  Patient presenting today for follow up of GERD.  Last seen September 2022, having GERD symptoms and bloating x 4 years. Taking nexium 20mg  BID, using gas x for bloating. Having harder stool pellets as well.   Recommended nexium 40mg  daily, schedule EGD, Miralax, bentyl 10mg  q12h, check celiac serology.  EGD as outlined below, esophageal manometry and pH impedence testing thereafter.   Esophageal manometry in January showed presence of some fractioned or weak swallows but overall it was considered a normal study.  The pH impedance testing on PPI twice a day showed a 10.9% acid exposure with a DeMeester score of 46.4, total of 104 reflux events were seen in 92% symptom association probability.  Thus, the patient was still presenting active reflux despite taking PPI compliantly.   Dr discussed different options which included changing to another PPI versus TIF versus Nissen fundoplication with potential hiatal hernia correction.  patient wanted to try another PPI at that time, Dexilant at max dose sent, however this was not covered by insurance, therefore she was increased to nexium 40mg  BID.   Present:  Reports she is [redacted] weeks pregnant. Nausea initially with pregnancy but this is improving. She reports that she continues to have GERD symptoms almost all day, every day, is taking nexium twice daily without much result. She feels that she has a lot of gas bubbles  coming up, no specific belching, having heartburn and acid regurgitation as well. Certain foods such as chocolate and spicy foods tend to cause worse issues, she does usually avoid these. Sometimes though even water can cause issues.  She feels that symptoms improve if she lays down. Taking tums with gas x in them multiple times per day. Has never been on another PPI besides nexium. She is interested in possible TIF procedure after her pregnancy and wants more information on this.   Was having a BM every day, now every 2-3 days. Has ongoing bloating. Is taking a stool softener as needed. She took bentyl previously and broke out in a rash so was advised to stop.  She felt that constipation really started when she became pregnant.   No red flag symptoms. Patient denies melena, hematochezia, nausea, vomiting, diarrhea, dysphagia, odyonophagia, early satiety or weight loss.   Last Colonoscopy:never Last Endoscopy:02/01/21 Z-line irregular, 34 cm from the incisors. - 1 cm hiatal hernia. - Normal stomach. - Normal examined duodenum. 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   05/16/21 repeat pap in 1 year per ASCCP guidelines 5 year risk of CIN3+ 3.78   Medical history non-contributory    Pregnant 10/19/2015   Vaginal burning 11/16/2015   Vaginal discharge during pregnancy in first trimester 11/16/2015   Yeast infection 11/02/2012    Past Surgical History:  Procedure Laterality Date  BIOPSY  02/01/2021   Procedure: BIOPSY;  Surgeon: Montez Morita, Quillian Quince, MD;  Location: [facility];  Service: Gastroenterology;;  small, bowel biopsies   ESOPHAGOGASTRODUODENOSCOPY (EGD) WITH PROPOFOL N/A 02/01/2021   Procedure: ESOPHAGOGASTRODUODENOSCOPY (EGD) WITH PROPOFOL;  Surgeon: Harvel Quale, MD;  Location: [facility];  Service:  Gastroenterology;  Laterality: N/A;  8:45   MOUTH SURGERY  Oct. 2012    Current Outpatient Medications  Medication Sig Dispense Refill   calcium carbonate (TUMS - DOSED IN MG ELEMENTAL CALCIUM) 500 MG chewable tablet Chew 1 tablet by mouth. As needed     esomeprazole (NEXIUM) 40 MG capsule TAKE 1 CAPSULE BY MOUTH TWICE DAILY BEFORE A MEAL 180 capsule 1   OVER THE COUNTER MEDICATION Stool softner as needed.     Prenatal Vit-Fe Fumarate-FA (PRENATAL VITAMIN PO) Take by mouth.     promethazine (PHENERGAN) 25 MG tablet Take 0.5-1 tablets (12.5-25 mg total) by mouth every 6 (six) hours as needed for nausea or vomiting. (Patient not taking: Reported on 02/13/2022) 30 tablet 6   No current facility-administered medications for this visit.    Allergies as of 02/13/2022 - Review Complete 02/13/2022  Allergen Reaction Noted   Cefdinir Anaphylaxis and Hives 10/28/2015   Dicyclomine Rash 02/05/2021    Family History  Problem Relation Age of Onset   Hypertension Maternal Grandmother    Hypertension Maternal Grandfather    Heart disease Maternal Grandfather     Social History   Socioeconomic History   Marital status: Single    Spouse name: Not on file   Number of children: Not on file   Years of education: Not on file   Highest education level: Not on file  Occupational History   Not on file  Tobacco Use   Smoking status: Former    Types: Cigarettes    Quit date: 09/24/2015    Years since quitting: 6.3    Passive exposure: Past   Smokeless tobacco: Former  Scientific laboratory technician Use: Never used  Substance and Sexual Activity   Alcohol use: Not Currently    Comment: occassional; not now   Drug use: No   Sexual activity: Yes  Other Topics Concern   Not on file  Social History Narrative   Not on file   Social Determinants of Health   Financial Resource Strain: Low Risk  (01/27/2022)   Overall Financial Resource Strain (CARDIA)    Difficulty of Paying Living Expenses: Not very  hard  Food Insecurity: No Food Insecurity (01/27/2022)   Hunger Vital Sign    Worried About Running Out of Food in the Last Year: Never true    [place] in the Last Year: Never true  Transportation Needs: No Transportation Needs (01/27/2022)   PRAPARE - Hydrologist (Medical): No    Lack of Transportation (Non-Medical): No  Physical Activity: Sufficiently Active (01/27/2022)   Exercise Vital Sign    Days of Exercise per Week: 5 days    Minutes of Exercise per Session: 100 min  Stress: No Stress Concern Present (01/27/2022)   [place]    Feeling of Stress : Only a little  Social Connections: Moderately Integrated (01/27/2022)   Social Connection and Isolation Panel [NHANES]    Frequency of Communication with Friends and Family: More than three times a week    Frequency of Social Gatherings with Friends and Family: More than three times a  week    Attends Religious Services: 1 to 4 times per year    Active Member of Clubs or Organizations: No    Attends Banker Meetings: Never    Marital Status: Living with partner    Review of systems General: negative for malaise, night sweats, fever, chills, weight loss Neck: Negative for lumps, goiter, pain and significant neck swelling Resp: Negative for cough, wheezing, dyspnea at rest CV: Negative for chest pain, leg swelling, palpitations, orthopnea GI: denies melena, hematochezia, nausea, vomiting, diarrhea, dysphagia, odyonophagia, early satiety or unintentional weight loss. +constipation +bloating +gas +GERD symptoms  MSK: Negative for joint pain or swelling, back pain, and muscle pain. Derm: Negative for itching or rash Psych: Denies depression, anxiety, memory loss, confusion. No homicidal or suicidal ideation.  Heme: Negative for prolonged bleeding, bruising easily, and swollen nodes. Endocrine: Negative for cold or heat  intolerance, polyuria, polydipsia and goiter. Neuro: negative for tremor, gait imbalance, syncope and seizures. The remainder of the review of systems is noncontributory.  Physical Exam: BP 103/65 (BP Location: Left Arm, Patient Position: Sitting, Cuff Size: Normal)   Pulse 88   Temp 98.2 F (36.8 C) (Oral)   Ht 5\' 4"  (1.626 m)   Wt 152 lb 4.8 oz (69.1 kg)   LMP 10/30/2021   BMI 26.14 kg/m  General:   Alert and oriented. No distress noted. Pleasant and cooperative.  Head:  Normocephalic and atraumatic. Eyes:  Conjuctiva clear without scleral icterus. Mouth:  Oral mucosa pink and moist. Good dentition. No lesions. Heart: Normal rate and rhythm, s1 and s2 heart sounds present.  Lungs: Clear lung sounds in all lobes. Respirations equal and unlabored. Abdomen:  +BS, soft, non-tender and non-distended. No rebound or guarding. No HSM or masses noted. Derm: No palmar erythema or jaundice Msk:  Symmetrical without gross deformities. Normal posture. Extremities:  Without edema. Neurologic:  Alert and  oriented x4 Psych:  Alert and cooperative. Normal mood and affect.  Invalid input(s): "6 MONTHS"   ASSESSMENT: Megan Tran is a 31 y.o. female presenting today for follow up of GERD and having constipation  GERD: not well managed on nexium 40mg  BID, has not been on any previous PPIs. Dexilant was not covered by her insurance previously. pH impedence and manometry showed active reflux. She continues to have GERD symptoms almost daily with heartburn, acid regurgitation and gas bubbles. Will stop nexium and try protonix 40mg  BID. She should continue to practice reflux precautions to include Avoid greasy, spicy, fried, citrus foods, and be mindful that caffeine, carbonated drinks, and chocolate can increase reflux symptoms. Stay upright 2-3 hours after eating, prior to lying down and avoid eating late in the evenings. TIF procedure was briefly discussed with her by Dr. 37 and again with  me today. I think this may be a good option for her given her GERD symptoms have been so difficult to manage. Will provide a TIF brochure for her to read over with potential plans to consider moving forward with this after her pregnancy.   In regards to constipation, having a BM every 2-3 days since pregnancy. Recommend Start taking Miralax 1 capful every day for one week. If bowel movements do not improve, increase to 1 capful every 12 hours. If after two weeks there is no improvement, increase to 1 capful every 8 hours. She should make sure water intake is good and diet is high in fiber.  She continues to have bloating and gas, is taking tums/gas x combo  mutliple times per day. She was counseled on adverse effects of ongoing, frequent tums use to include hypercalcemia and constipation. Advised to use these PRN only up to a few times per week if absolutely needed. Recommend taking gas x or phazyme for her gas and bloating as these are safe in pregnancy. She did not tolerate bentyl in the past as it caused a rash.  No red flag symptoms. Patient denies melena, hematochezia, nausea, vomiting, diarrhea, dysphagia, odyonophagia, early satiety or weight loss.    PLAN:  1.stop nexium, start protonix 40mg  BID  2. Start taking Miralax 1 capful every day for one week. If bowel movements do not improve, increase to 1 capful every 12 hours. If after two weeks there is no improvement, increase to 1 capful every 8 hours 3. Reflux precautions 4.phazyme or gas x 5. Avoid frequent use of tums 6. Good water intake and high fiber diet 7. TIF pamphlet provided, further considerations of this after completion of pregnancy   All questions were answered, patient verbalized understanding and is in agreement with plan as outlined above.    Follow Up: 6 months   Megan Salmi L. , MSN, APRN, AGNP-C Adult-Gerontology Nurse Practitioner [facility]  I have reviewed the note and agree with the  APP's assessment as described in this progress note  Importantly, the patient has previously failed management with anti-H2 medication and is still having some symptoms while taking PPI twice a day.  Even though the use of PPI is not the first line for GERD treatment during pregnancy, it is indicated in patients that have clinically active disease.  [facility], MD Gastroenterology and Hepatology [facility] Gastroenterology

## 2022-02-13 NOTE — Patient Instructions (Signed)
Please stop nexium We will start protonix 40mg  twice daily  Avoid greasy, spicy, fried, citrus foods, and be mindful that caffeine, carbonated drinks, chocolate and alcohol can increase reflux symptoms Stay upright 2-3 hours after eating, prior to lying down and avoid eating late in the evenings.  I have provided the TIF brochure as I think this may be a good option for you after your pregnancy, let us know if you have any further questions regarding this procedure  You can take over the counter phazyme or gas x safely for bloating and gas For constipation Start taking Miralax 1 capful every day for one week. If bowel movements do not improve, increase to 1 capful every 12 hours. If after two weeks there is no improvement, increase to 1 capful every 8 hours Make sure you are drinking plenty of water and trying to eat a diet high in fiber  Follow up 6 months

## 2022-02-14 NOTE — Addendum Note (Signed)
Addended by: Harvel Quale on: 02/14/2022 02:58 PM   Modules accepted: Level of Service

## 2022-02-21 ENCOUNTER — Encounter: Payer: Self-pay | Admitting: Women's Health

## 2022-02-24 ENCOUNTER — Encounter: Payer: Self-pay | Admitting: Women's Health

## 2022-02-24 ENCOUNTER — Ambulatory Visit (INDEPENDENT_AMBULATORY_CARE_PROVIDER_SITE_OTHER): Payer: BC Managed Care – PPO | Admitting: Women's Health

## 2022-02-24 VITALS — BP 104/63 | HR 92 | Wt 152.8 lb

## 2022-02-24 DIAGNOSIS — Z3A16 16 weeks gestation of pregnancy: Secondary | ICD-10-CM

## 2022-02-24 DIAGNOSIS — Z3482 Encounter for supervision of other normal pregnancy, second trimester: Secondary | ICD-10-CM

## 2022-02-24 DIAGNOSIS — Z363 Encounter for antenatal screening for malformations: Secondary | ICD-10-CM

## 2022-02-24 DIAGNOSIS — Z1379 Encounter for other screening for genetic and chromosomal anomalies: Secondary | ICD-10-CM

## 2022-02-24 DIAGNOSIS — Z3480 Encounter for supervision of other normal pregnancy, unspecified trimester: Secondary | ICD-10-CM

## 2022-02-24 NOTE — Addendum Note (Signed)
Addended by: Janece Canterbury on: 02/24/2022 01:46 PM   Modules accepted: Orders

## 2022-02-24 NOTE — Progress Notes (Deleted)
LOW-RISK PREGNANCY VISIT Patient name: Megan Tran MRN [id number]  Date of birth: 1990/09/09 Chief Complaint:   Routine Prenatal Visit  History of Present Illness:   Megan Tran is a 31 y.o. G70P1001 female at [redacted]w[redacted]d with an Estimated Date of Delivery: 08/06/22 being seen today for ongoing management of a low-risk pregnancy.   Today she reports {pregnancy symptoms:25616::"no complaints"}. Contractions: Not present. Vag. Bleeding: None.  Movement: Present. {Actions; denies-reports:120008::"denies"} leaking of fluid.     01/27/2022    9:55 AM 04/19/2018    4:07 PM 03/23/2018    4:25 PM  Depression screen PHQ 2/9  Decreased Interest 0 0 0  Down, Depressed, Hopeless 0 0 0  PHQ - 2 Score 0 0 0  Altered sleeping 0    Tired, decreased energy 1    Change in appetite 1    Feeling bad or failure about yourself  0    Trouble concentrating 0    Moving slowly or fidgety/restless 0    Suicidal thoughts 0    PHQ-9 Score 2          01/27/2022    9:55 AM  GAD 7 : Generalized Anxiety Score  Nervous, Anxious, on Edge 0  Control/stop worrying 0  Worry too much - different things 1  Trouble relaxing 0  Restless 0  Easily annoyed or irritable 1  Afraid - awful might happen 0  Total GAD 7 Score 2      Review of Systems:   Pertinent items are noted in HPI Denies abnormal vaginal discharge w/ itching/odor/irritation, headaches, visual changes, shortness of breath, chest pain, abdominal pain, severe nausea/vomiting, or problems with urination or bowel movements unless otherwise stated above. Pertinent History Reviewed:  Reviewed past medical,surgical, social, obstetrical and family history.  Reviewed problem list, medications and allergies. Physical Assessment:   Vitals:   02/24/22 0910  BP: 104/63  Pulse: 92  Weight: 152 lb 12.8 oz (69.3 kg)  Body mass index is 26.23 kg/m.        Physical Examination:   General appearance: Well appearing, and in no distress  Mental  status: Alert, oriented to person, place, and time  Skin: Warm & dry  Cardiovascular: Normal heart rate noted  Respiratory: Normal respiratory effort, no distress  Abdomen: Soft, gravid, nontender  Pelvic: {Blank single:19197::"Cervical exam performed","Cervical exam deferred"}         Extremities: Edema: None  Fetal Status:     Movement: Present    Chaperone: {Chaperone:19197::"N/A","pt declined","Latisha Cresenzo","Janet Young","Amanda Andrews","Peggy Dones","Calandra Johnson"}   No results found for this or any previous visit (from the past 24 hour(s)).  Assessment & Plan:  1) Low-risk pregnancy G2P1001 at [redacted]w[redacted]d with an Estimated Date of Delivery: 08/06/22   2) ***, ***   Meds: No orders of the defined types were placed in this encounter.  Labs/procedures today: {ob lab/procedures:25214}  Plan:  Continue routine obstetrical care *** Next visit: prefers {Blank single:19197::"in person","online","will be in person for ***"}    Reviewed: {Blank single:19197::"Term","Preterm"} labor symptoms and general obstetric precautions including but not limited to vaginal bleeding, contractions, leaking of fluid and fetal movement were reviewed in detail with the patient.  All questions were answered. {does does not:25387::"Does"} have home bp cuff. Office bp cuff given: {yes/no/default n/a:21102::"not applicable"}. Check bp {weekly daily:25388::"weekly"}, let us know if consistently {pregnant bp:25389::">140 and/or >90"}.  Follow-up: No follow-ups on file.  Future Appointments  Date Time Provider Department Center  03/17/2022  9:15 AM [facility] [place] [facility] None  03/17/2022 10:10 AM Christin Fudge, CNM [facility]  08/18/2022  9:30 AM Harvel Quale, MD [facility] None    Orders Placed This Encounter  Procedures   INTEGRATED 2   Roma Schanz CNM, [facility] [place] 02/24/2022 9:19 AM

## 2022-02-24 NOTE — Patient Instructions (Signed)
[place], thank you for choosing our office today! We appreciate the opportunity to meet your healthcare needs. You may receive a short survey by mail, e-mail, or through [employer]. If you are happy with your care we would appreciate if you could take just a few minutes to complete the survey questions. We read all of your comments and take your feedback very seriously. Thank you again for choosing our office.  Center for [employer] Team at [place] at [facility] ([place], [place] [address]) Entrance C, located off of [address] parking  Go to [email or web address] to register for FREE online childbirth classes  Call the office [phone]) or go to [facility] if: You begin to severe cramping Your water breaks.  Sometimes it is a big gush of fluid, sometimes it is just a trickle that keeps getting your panties wet or running down your legs You have vaginal bleeding.  It is normal to have a small amount of spotting if your cervix was checked.   [facility] Pediatricians/Family Doctors [facility]): [address] [place], [place] [facility]: [address] [place], [phone]                [facility]): [place], [phone] (call to ask if accepting patients) [facility] Department: [address], [place], [employer] Pediatricians/Family Doctors [facility]): [place]. [place], [address], [place] [facility] [address] [place], [place] [facility]: [place], [place] [facility]): [phone] [facility]: [address], [place]: [address] [place] [facility]: [phone], [phone]  Home Blood Pressure Monitoring for Patients   Your provider has recommended that you check your blood pressure (BP) at least once a week at home. If you do not have a blood pressure cuff at home, one will be provided for you. Contact your provider if you have not received your monitor within 1 week.   Helpful Tips for Accurate Home Blood Pressure Checks  Don't smoke, exercise, or drink caffeine 30 minutes before checking your BP Use the restroom before checking your BP (a full bladder can raise your pressure) Relax in a comfortable upright chair Feet on the ground Left arm resting comfortably on a flat surface at the level of your heart Legs uncrossed Back supported Sit quietly and don't talk Place the cuff on your bare arm Adjust snuggly, so that only two fingertips can fit between your skin and the top of the cuff Check 2 readings separated by at least one minute Keep a log of your BP readings For a visual, please reference this diagram: http://ccnc.care/bpdiagram  Provider Name: [facility]     Phone: [phone]  Zone 1: ALL CLEAR  Continue to monitor your symptoms:  BP reading is less than 140 (top number) or less than 90 (bottom number)  No right upper stomach pain No headaches or seeing spots No feeling nauseated or throwing up No swelling in face and hands  Zone 2: CAUTION Call your doctor's office for any of the following:  BP reading is greater than 140 (top number) or greater than  90 (bottom number)  Stomach pain under your ribs in the middle or right side Headaches or seeing spots Feeling nauseated or throwing up Swelling in face and hands  Zone 3: EMERGENCY  Seek immediate medical care if you have any of the following:  BP reading is greater than160 (top number) or greater than 110 (bottom number) Severe headaches not improving with Tylenol Serious difficulty catching your breath Any worsening symptoms from  Zone 2     Second Trimester of Pregnancy The second trimester is from week 14 through week 27 (months 4 through 6). The second trimester is often a time when you feel your best. Your body has adjusted to being pregnant, and you begin to feel better physically. Usually, morning sickness has lessened or quit completely, you may have more energy, and you may have an increase in appetite. The second trimester is also a time when the fetus is growing rapidly. At the end of the sixth month, the fetus is about 9 inches long and weighs about 1 pounds. You will likely begin to feel the baby move (quickening) between 16 and 20 weeks of pregnancy. Body changes during your second trimester Your body continues to go through many changes during your second trimester. The changes vary from woman to woman. Your weight will continue to increase. You will notice your lower abdomen bulging out. You may begin to get stretch marks on your hips, abdomen, and breasts. You may develop headaches that can be relieved by medicines. The medicines should be approved by your health care provider. You may urinate more often because the fetus is pressing on your bladder. You may develop or continue to have heartburn as a result of your pregnancy. You may develop constipation because certain hormones are causing the muscles that push waste through your intestines to slow down. You may develop hemorrhoids or swollen, bulging veins (varicose veins). You may have back pain. This is caused by: Weight gain. Pregnancy hormones that are relaxing the joints in your pelvis. A shift in weight and the muscles that support your balance. Your breasts will continue to grow and they will continue to become tender. Your gums may bleed and may be sensitive to brushing and flossing. Dark spots or blotches (chloasma, mask of pregnancy) may develop on your face. This will likely fade after the baby is born. A dark line from your belly button to  the pubic area (linea nigra) may appear. This will likely fade after the baby is born. You may have changes in your hair. These can include thickening of your hair, rapid growth, and changes in texture. Some women also have hair loss during or after pregnancy, or hair that feels dry or thin. Your hair will most likely return to normal after your baby is born.  What to expect at prenatal visits During a routine prenatal visit: You will be weighed to make sure you and the fetus are growing normally. Your blood pressure will be taken. Your abdomen will be measured to track your baby's growth. The fetal heartbeat will be listened to. Any test results from the previous visit will be discussed.  Your health care provider may ask you: How you are feeling. If you are feeling the baby move. If you have had any abnormal symptoms, such as leaking fluid, bleeding, severe headaches, or abdominal cramping. If you are using any tobacco products, including cigarettes, chewing tobacco, and electronic cigarettes. If you have any questions.  Other tests that may be performed during   your second trimester include: Blood tests that check for: Low iron levels (anemia). High blood sugar that affects pregnant women (gestational diabetes) between 24 and 28 weeks. Rh antibodies. This is to check for a protein on red blood cells (Rh factor). Urine tests to check for infections, diabetes, or protein in the urine. An ultrasound to confirm the proper growth and development of the baby. An amniocentesis to check for possible genetic problems. Fetal screens for spina bifida and Down syndrome. HIV (human immunodeficiency virus) testing. Routine prenatal testing includes screening for HIV, unless you choose not to have this test.  Follow these instructions at home: Medicines Follow your health care provider's instructions regarding medicine use. Specific medicines may be either safe or unsafe to take during  pregnancy. Take a prenatal vitamin that contains at least 600 micrograms (mcg) of folic acid. If you develop constipation, try taking a stool softener if your health care provider approves. Eating and drinking Eat a balanced diet that includes fresh fruits and vegetables, whole grains, good sources of protein such as meat, eggs, or tofu, and low-fat dairy. Your health care provider will help you determine the amount of weight gain that is right for you. Avoid raw meat and uncooked cheese. These carry germs that can cause birth defects in the baby. If you have low calcium intake from food, talk to your health care provider about whether you should take a daily calcium supplement. Limit foods that are high in fat and processed sugars, such as fried and sweet foods. To prevent constipation: Drink enough fluid to keep your urine clear or pale yellow. Eat foods that are high in fiber, such as fresh fruits and vegetables, whole grains, and beans. Activity Exercise only as directed by your health care provider. Most women can continue their usual exercise routine during pregnancy. Try to exercise for 30 minutes at least 5 days a week. Stop exercising if you experience uterine contractions. Avoid heavy lifting, wear low heel shoes, and practice good posture. A sexual relationship may be continued unless your health care provider directs you otherwise. Relieving pain and discomfort Wear a good support bra to prevent discomfort from breast tenderness. Take warm sitz baths to soothe any pain or discomfort caused by hemorrhoids. Use hemorrhoid cream if your health care provider approves. Rest with your legs elevated if you have leg cramps or low back pain. If you develop varicose veins, wear support hose. Elevate your feet for 15 minutes, 3-4 times a day. Limit salt in your diet. Prenatal Care Write down your questions. Take them to your prenatal visits. Keep all your prenatal visits as told by your health  care provider. This is important. Safety Wear your seat belt at all times when driving. Make a list of emergency phone numbers, including numbers for family, friends, the hospital, and police and fire departments. General instructions Ask your health care provider for a referral to a local prenatal education class. Begin classes no later than the beginning of month 6 of your pregnancy. Ask for help if you have counseling or nutritional needs during pregnancy. Your health care provider can offer advice or refer you to specialists for help with various needs. Do not use hot tubs, steam rooms, or saunas. Do not douche or use tampons or scented sanitary pads. Do not cross your legs for long periods of time. Avoid cat litter boxes and soil used by cats. These carry germs that can cause birth defects in the baby and possibly loss of the   fetus by miscarriage or stillbirth. Avoid all smoking, herbs, alcohol, and unprescribed drugs. Chemicals in these products can affect the formation and growth of the baby. Do not use any products that contain nicotine or tobacco, such as cigarettes and e-cigarettes. If you need help quitting, ask your health care provider. Visit your dentist if you have not gone yet during your pregnancy. Use a soft toothbrush to brush your teeth and be gentle when you floss. Contact a health care provider if: You have dizziness. You have mild pelvic cramps, pelvic pressure, or nagging pain in the abdominal area. You have persistent nausea, vomiting, or diarrhea. You have a bad smelling vaginal discharge. You have pain when you urinate. Get help right away if: You have a fever. You are leaking fluid from your vagina. You have spotting or bleeding from your vagina. You have severe abdominal cramping or pain. You have rapid weight gain or weight loss. You have shortness of breath with chest pain. You notice sudden or extreme swelling of your face, hands, ankles, feet, or legs. You  have not felt your baby move in over an hour. You have severe headaches that do not go away when you take medicine. You have vision changes. Summary The second trimester is from week 14 through week 27 (months 4 through 6). It is also a time when the fetus is growing rapidly. Your body goes through many changes during pregnancy. The changes vary from woman to woman. Avoid all smoking, herbs, alcohol, and unprescribed drugs. These chemicals affect the formation and growth your baby. Do not use any tobacco products, such as cigarettes, chewing tobacco, and e-cigarettes. If you need help quitting, ask your health care provider. Contact your health care provider if you have any questions. Keep all prenatal visits as told by your health care provider. This is important. This information is not intended to replace advice given to you by your health care provider. Make sure you discuss any questions you have with your health care provider. Document Released: 04/01/2001 Document Revised: 09/13/2015 Document Reviewed: 06/08/2012 Elsevier Interactive Patient Education  2017 Elsevier Inc.  

## 2022-02-24 NOTE — Progress Notes (Signed)
LOW-RISK PREGNANCY VISIT Patient name: Megan Tran MRN [id number]  Date of birth: December 21, 1990 Chief Complaint:   Routine Prenatal Visit  History of Present Illness:   Megan Tran is a 31 y.o. G59P1001 female at [redacted]w[redacted]d with an Estimated Date of Delivery: 08/06/22 being seen today for ongoing management of a low-risk pregnancy.   Today she reports  leg cramps at night, reflux- tums helps . Contractions: Not present. Vag. Bleeding: None.  Movement: Present. denies leaking of fluid.     01/27/2022    9:55 AM 04/19/2018    4:07 PM 03/23/2018    4:25 PM  Depression screen PHQ 2/9  Decreased Interest 0 0 0  Down, Depressed, Hopeless 0 0 0  PHQ - 2 Score 0 0 0  Altered sleeping 0    Tired, decreased energy 1    Change in appetite 1    Feeling bad or failure about yourself  0    Trouble concentrating 0    Moving slowly or fidgety/restless 0    Suicidal thoughts 0    PHQ-9 Score 2          01/27/2022    9:55 AM  GAD 7 : Generalized Anxiety Score  Nervous, Anxious, on Edge 0  Control/stop worrying 0  Worry too much - different things 1  Trouble relaxing 0  Restless 0  Easily annoyed or irritable 1  Afraid - awful might happen 0  Total GAD 7 Score 2      Review of Systems:   Pertinent items are noted in HPI Denies abnormal vaginal discharge w/ itching/odor/irritation, headaches, visual changes, shortness of breath, chest pain, abdominal pain, severe nausea/vomiting, or problems with urination or bowel movements unless otherwise stated above. Pertinent History Reviewed:  Reviewed past medical,surgical, social, obstetrical and family history.  Reviewed problem list, medications and allergies. Physical Assessment:   Vitals:   02/24/22 0910  BP: 104/63  Pulse: 92  Weight: 152 lb 12.8 oz (69.3 kg)  Body mass index is 26.23 kg/m.        Physical Examination:   General appearance: Well appearing, and in no distress  Mental status: Alert, oriented to person, place,  and time  Skin: Warm & dry  Cardiovascular: Normal heart rate noted  Respiratory: Normal respiratory effort, no distress  Abdomen: Soft, gravid, nontender  Pelvic: Cervical exam deferred         Extremities: Edema: None  Fetal Status: Fetal Heart Rate (bpm): 152   Movement: Present    Chaperone: N/A   No results found for this or any previous visit (from the past 24 hour(s)).  Assessment & Plan:  1) Low-risk pregnancy G2P1001 at [redacted]w[redacted]d with an Estimated Date of Delivery: 08/06/22   2) Leg cramps, discussed options   Meds: No orders of the defined types were placed in this encounter.  Labs/procedures today: GC/CT, urine culture, and 2nd IT  Plan:  Continue routine obstetrical care  Next visit: prefers will be in person for u/s     Reviewed: Preterm labor symptoms and general obstetric precautions including but not limited to vaginal bleeding, contractions, leaking of fluid and fetal movement were reviewed in detail with the patient.  All questions were answered. Does have home bp cuff. Office bp cuff given: not applicable. Check bp weekly, let us know if consistently >140 and/or >90.  Follow-up: Return for As scheduled.  Future Appointments  Date Time Provider Department Center  03/17/2022  9:15 AM [facility] [place]  [facility] None  03/17/2022 10:10 AM Christin Fudge, CNM [facility]  08/18/2022  9:30 AM Harvel Quale, MD [facility] None    Orders Placed This Encounter  Procedures   US OB Comp + 14 Wk   INTEGRATED 2   Roma Schanz CNM, [facility] 02/24/2022 9:29 AM

## 2022-02-26 LAB — INTEGRATED 2
AFP MoM: 1.22
Alpha-Fetoprotein: 44.4 ng/mL
Crown Rump Length: 68.8 mm
DIA MoM: 0.89
DIA Value: 133.6 pg/mL
Estriol, Unconjugated: 1.46 ng/mL
Gest. Age on Collection Date: 13 weeks
Gestational Age: 17 weeks
Maternal Age at EDD: 30 yr
Nuchal Translucency (NT): 1.8 mm
Nuchal Translucency MoM: 1.16
Number of Fetuses: 1
PAPP-A MoM: 1.15
PAPP-A Value: 1408.5 ng/mL
Test Results:: NEGATIVE
Weight: 151 [lb_av]
Weight: 151 [lb_av]
hCG MoM: 1.48
hCG Value: 44.6 IU/mL
uE3 MoM: 1.31

## 2022-02-26 LAB — URINE CULTURE

## 2022-02-26 LAB — GC/CHLAMYDIA PROBE AMP
Chlamydia trachomatis, NAA: NEGATIVE
Neisseria Gonorrhoeae by PCR: NEGATIVE

## 2022-03-17 ENCOUNTER — Ambulatory Visit (INDEPENDENT_AMBULATORY_CARE_PROVIDER_SITE_OTHER): Payer: BC Managed Care – PPO | Admitting: Advanced Practice Midwife

## 2022-03-17 ENCOUNTER — Ambulatory Visit (INDEPENDENT_AMBULATORY_CARE_PROVIDER_SITE_OTHER): Payer: BC Managed Care – PPO

## 2022-03-17 VITALS — BP 101/64 | HR 87 | Wt 154.0 lb

## 2022-03-17 DIAGNOSIS — Z3482 Encounter for supervision of other normal pregnancy, second trimester: Secondary | ICD-10-CM

## 2022-03-17 DIAGNOSIS — Z3480 Encounter for supervision of other normal pregnancy, unspecified trimester: Secondary | ICD-10-CM

## 2022-03-17 DIAGNOSIS — Z3A19 19 weeks gestation of pregnancy: Secondary | ICD-10-CM

## 2022-03-17 DIAGNOSIS — Z363 Encounter for antenatal screening for malformations: Secondary | ICD-10-CM

## 2022-03-17 NOTE — Progress Notes (Signed)
[place] 19+5 wks,breech,posterior placenta gr 0,normal ovaries,cx 4.7 cm,SVP of fluid 5.2 cm,FHR 138 bpm,EFW 326 g 61%,anatomy complete,no obvious abnormalities

## 2022-03-17 NOTE — Progress Notes (Signed)
   LOW-RISK PREGNANCY VISIT Patient name: Megan Tran MRN [id number]  Date of birth: Apr 06, 1991 Chief Complaint:   Routine Prenatal Visit  History of Present Illness:   Megan Tran is a 31 y.o. G52P1001 female at [redacted]w[redacted]d with an Estimated Date of Delivery: 08/06/22 being seen today for ongoing management of a low-risk pregnancy.  Today she reports  reflux, protonix is helping better than nexium . Contractions: Not present. Vag. Bleeding: None.  Movement: Present. denies leaking of fluid. Review of Systems:   Pertinent items are noted in HPI Denies abnormal vaginal discharge w/ itching/odor/irritation, headaches, visual changes, shortness of breath, chest pain, abdominal pain, severe nausea/vomiting, or problems with urination or bowel movements unless otherwise stated above. Pertinent History Reviewed:  Reviewed past medical,surgical, social, obstetrical and family history.  Reviewed problem list, medications and allergies. Physical Assessment:   Vitals:   03/17/22 1012  BP: 101/64  Pulse: 87  Weight: 154 lb (69.9 kg)  Body mass index is 26.43 kg/m.        Physical Examination:   General appearance: Well appearing, and in no distress  Mental status: Alert, oriented to person, place, and time  Skin: Warm & dry  Cardiovascular: Normal heart rate noted  Respiratory: Normal respiratory effort, no distress  Abdomen: Soft, gravid, nontender  Pelvic: Cervical exam deferred         Extremities: Edema: None  Fetal Status:     Movement: Present  [place] 19+5 wks,breech,posterior placenta gr 0,normal ovaries,cx 4.7 cm,SVP of fluid 5.2 cm,FHR 138 bpm,EFW 326 g 61%,anatomy complete,no obvious abnormalities   Chaperone: n/a    No results found for this or any previous visit (from the past 24 hour(s)).  Assessment & Plan:  1) Low-risk pregnancy G2P1001 at [redacted]w[redacted]d with an Estimated Date of Delivery: 08/06/22      Meds: No orders of the defined types were placed in this  encounter.  Labs/procedures today: anatomy scan  Plan:  Continue routine obstetrical care  Next visit: prefers in person    Reviewed: Preterm labor symptoms and general obstetric precautions including but not limited to vaginal bleeding, contractions, leaking of fluid and fetal movement were reviewed in detail with the patient.  All questions were answered. Has home bp cuff.  Check bp weekly, let us know if >140/90.   Follow-up: No follow-ups on file.  Future Appointments  Date Time Provider Department Center  08/18/2022  9:30 AM Marguerita Merles, Reuel Boom, MD [facility] None    No orders of the defined types were placed in this encounter.  Jacklyn Shell DNP, CNM 03/17/2022 10:31 AM

## 2022-03-17 NOTE — Patient Instructions (Signed)
[name], I greatly value your feedback.  If you receive a survey following your visit with [place] today, we appreciate you taking the time to fill it out.  Thanks, Cathie Beams, CNM     [facility] HAS MOVED!!! It is now [facility] ([address] [place], [place] [address]) Entrance located off of [address] Free 24/7 valet parking   Go to [email or web address] to register for FREE online childbirth classes    Second Trimester of Pregnancy The second trimester is from week 14 through week 27 (months 4 through 6). The second trimester is often a time when you feel your best. Your body has adjusted to being pregnant, and you begin to feel better physically. Usually, morning sickness has lessened or quit completely, you may have more energy, and you may have an increase in appetite. The second trimester is also a time when the fetus is growing rapidly. At the end of the sixth month, the fetus is about 9 inches long and weighs about 1 pounds. You will likely begin to feel the baby move (quickening) between 16 and 20 weeks of pregnancy. Body changes during your second trimester Your body continues to go through many changes during your second trimester. The changes vary from woman to woman. Your weight will continue to increase. You will notice your lower abdomen bulging out. You may begin to get stretch marks on your hips, abdomen, and breasts. You may develop headaches that can be relieved by medicines. The medicines should be approved by your health care provider. You may urinate more often because the fetus is pressing on your bladder. You may develop or continue to have heartburn as a result of your pregnancy. You may develop constipation because certain hormones are causing the muscles that push waste through your intestines to slow down. You may develop hemorrhoids or swollen, bulging veins (varicose veins). You may have back pain. This  is caused by: Weight gain. Pregnancy hormones that are relaxing the joints in your pelvis. A shift in weight and the muscles that support your balance. Your breasts will continue to grow and they will continue to become tender. Your gums may bleed and may be sensitive to brushing and flossing. Dark spots or blotches (chloasma, mask of pregnancy) may develop on your face. This will likely fade after the baby is born. A dark line from your belly button to the pubic area (linea nigra) may appear. This will likely fade after the baby is born. You may have changes in your hair. These can include thickening of your hair, rapid growth, and changes in texture. Some women also have hair loss during or after pregnancy, or hair that feels dry or thin. Your hair will most likely return to normal after your baby is born.  What to expect at prenatal visits During a routine prenatal visit: You will be weighed to make sure you and the fetus are growing normally. Your blood pressure will be taken. Your abdomen will be measured to track your baby's growth. The fetal heartbeat will be listened to. Any test results from the previous visit will be discussed.  Your health care provider may ask you: How you are feeling. If you are feeling the baby move. If you have had any abnormal symptoms, such as leaking fluid, bleeding, severe headaches, or abdominal cramping. If you are using any tobacco products, including cigarettes, chewing tobacco, and electronic cigarettes. If you have any questions.  Other tests  that may be performed during your second trimester include: Blood tests that check for: Low iron levels (anemia). High blood sugar that affects pregnant women (gestational diabetes) between 54 and 28 weeks. Rh antibodies. This is to check for a protein on red blood cells (Rh factor). Urine tests to check for infections, diabetes, or protein in the urine. An ultrasound to confirm the proper growth and  development of the baby. An amniocentesis to check for possible genetic problems. Fetal screens for spina bifida and Down syndrome. HIV (human immunodeficiency virus) testing. Routine prenatal testing includes screening for HIV, unless you choose not to have this test.  Follow these instructions at home: Medicines Follow your health care provider's instructions regarding medicine use. Specific medicines may be either safe or unsafe to take during pregnancy. Take a prenatal vitamin that contains at least 600 micrograms (mcg) of folic acid. If you develop constipation, try taking a stool softener if your health care provider approves. Eating and drinking Eat a balanced diet that includes fresh fruits and vegetables, whole grains, good sources of protein such as meat, eggs, or tofu, and low-fat dairy. Your health care provider will help you determine the amount of weight gain that is right for you. Avoid raw meat and uncooked cheese. These carry germs that can cause birth defects in the baby. If you have low calcium intake from food, talk to your health care provider about whether you should take a daily calcium supplement. Limit foods that are high in fat and processed sugars, such as fried and sweet foods. To prevent constipation: Drink enough fluid to keep your urine clear or pale yellow. Eat foods that are high in fiber, such as fresh fruits and vegetables, whole grains, and beans. Activity Exercise only as directed by your health care provider. Most women can continue their usual exercise routine during pregnancy. Try to exercise for 30 minutes at least 5 days a week. Stop exercising if you experience uterine contractions. Avoid heavy lifting, wear low heel shoes, and practice good posture. A sexual relationship may be continued unless your health care provider directs you otherwise. Relieving pain and discomfort Wear a good support bra to prevent discomfort from breast tenderness. Take  warm sitz baths to soothe any pain or discomfort caused by hemorrhoids. Use hemorrhoid cream if your health care provider approves. Rest with your legs elevated if you have leg cramps or low back pain. If you develop varicose veins, wear support hose. Elevate your feet for 15 minutes, 3-4 times a day. Limit salt in your diet. Prenatal Care Write down your questions. Take them to your prenatal visits. Keep all your prenatal visits as told by your health care provider. This is important. Safety Wear your seat belt at all times when driving. Make a list of emergency phone numbers, including numbers for family, friends, the hospital, and police and fire departments. General instructions Ask your health care provider for a referral to a local prenatal education class. Begin classes no later than the beginning of month 6 of your pregnancy. Ask for help if you have counseling or nutritional needs during pregnancy. Your health care provider can offer advice or refer you to specialists for help with various needs. Do not use hot tubs, steam rooms, or saunas. Do not douche or use tampons or scented sanitary pads. Do not cross your legs for long periods of time. Avoid cat litter boxes and soil used by cats. These carry germs that can cause birth defects in the baby  and possibly loss of the fetus by miscarriage or stillbirth. Avoid all smoking, herbs, alcohol, and unprescribed drugs. Chemicals in these products can affect the formation and growth of the baby. Do not use any products that contain nicotine or tobacco, such as cigarettes and e-cigarettes. If you need help quitting, ask your health care provider. Visit your dentist if you have not gone yet during your pregnancy. Use a soft toothbrush to brush your teeth and be gentle when you floss. Contact a health care provider if: You have dizziness. You have mild pelvic cramps, pelvic pressure, or nagging pain in the abdominal area. You have persistent  nausea, vomiting, or diarrhea. You have a bad smelling vaginal discharge. You have pain when you urinate. Get help right away if: You have a fever. You are leaking fluid from your vagina. You have spotting or bleeding from your vagina. You have severe abdominal cramping or pain. You have rapid weight gain or weight loss. You have shortness of breath with chest pain. You notice sudden or extreme swelling of your face, hands, ankles, feet, or legs. You have not felt your baby move in over an hour. You have severe headaches that do not go away when you take medicine. You have vision changes. Summary The second trimester is from week 14 through week 27 (months 4 through 6). It is also a time when the fetus is growing rapidly. Your body goes through many changes during pregnancy. The changes vary from woman to woman. Avoid all smoking, herbs, alcohol, and unprescribed drugs. These chemicals affect the formation and growth your baby. Do not use any tobacco products, such as cigarettes, chewing tobacco, and e-cigarettes. If you need help quitting, ask your health care provider. Contact your health care provider if you have any questions. Keep all prenatal visits as told by your health care provider. This is important. This information is not intended to replace advice given to you by your health care provider. Make sure you discuss any questions you have with your health care provider.

## 2022-03-19 DIAGNOSIS — M5489 Other dorsalgia: Secondary | ICD-10-CM | POA: Diagnosis not present

## 2022-03-20 DIAGNOSIS — R609 Edema, unspecified: Secondary | ICD-10-CM | POA: Diagnosis not present

## 2022-03-28 ENCOUNTER — Telehealth: Payer: Self-pay | Admitting: Advanced Practice Midwife

## 2022-03-28 NOTE — Telephone Encounter (Signed)
Pt is requesting a call back. 

## 2022-03-31 ENCOUNTER — Encounter: Payer: Self-pay | Admitting: Advanced Practice Midwife

## 2022-03-31 ENCOUNTER — Ambulatory Visit (INDEPENDENT_AMBULATORY_CARE_PROVIDER_SITE_OTHER): Payer: BC Managed Care – PPO | Admitting: Advanced Practice Midwife

## 2022-03-31 VITALS — BP 99/62 | HR 75 | Wt 158.2 lb

## 2022-03-31 DIAGNOSIS — Z3482 Encounter for supervision of other normal pregnancy, second trimester: Secondary | ICD-10-CM

## 2022-03-31 DIAGNOSIS — O98312 Other infections with a predominantly sexual mode of transmission complicating pregnancy, second trimester: Secondary | ICD-10-CM

## 2022-03-31 DIAGNOSIS — Z3A21 21 weeks gestation of pregnancy: Secondary | ICD-10-CM

## 2022-03-31 DIAGNOSIS — A630 Anogenital (venereal) warts: Secondary | ICD-10-CM | POA: Insufficient documentation

## 2022-03-31 NOTE — Patient Instructions (Addendum)
Genital Warts  Genital warts are a common STI (sexually transmitted infection). They may appear as small bumps on the skin of the genital and anal areas. They sometimes become irritated and cause pain. Genital warts are easily passed to other people through sexual contact. Many people do not know that they are infected, and they may be infected for years without symptoms. Even without symptoms, they can pass the infection to their sexual partners. What are the causes? This condition is caused by a virus that is called human papillomavirus (HPV). HPV is spread by having unprotected sex with an infected person. It can be spread through vaginal, anal, and oral sex. What increases the risk? You are more likely to develop this condition if: You have unprotected sex. You have multiple sexual partners. You are sexually active before age 34. You are a man who is not circumcised. You have a female sexual partner who is not circumcised. You have a weakened body defense system (immune system) due to disease or medicine. What are the signs or symptoms? Symptoms of this condition include: Small growths in the genital area or anal area. These warts often grow in clusters. Itching and irritation in the genital area or anal area. Bleeding from the warts. Pain during sex. How is this diagnosed? This condition is diagnosed based on your symptoms and a physical exam. You may also have other tests, including: Biopsy. A tissue sample is removed so it can be checked under a microscope. Colposcopy. In females, a magnifying tool is used to examine the vagina and cervix. Certain solutions may be used to make the HPV cells change color so they can be seen more easily. A Pap test in females. Tests for other STIs. How is this treated? This condition may be treated with: Medicines, such as solutions or creams that are applied to your skin (topical). Procedures, such as: Freezing the warts with liquid nitrogen  (cryotherapy). Burning the warts with a laser or electric probe (electrocautery). Surgery to remove the warts. Getting treatment is important because genital warts can lead to other problems. In females, the virus that causes genital warts may increase the risk for cervical cancer. Follow these instructions at home: Medicines  Apply over-the-counter and prescription medicines only as told by your health care provider. Do not treat genital warts with medicines that are used for treating hand warts. Talk with your health care provider about using over-the-counter anti-itch creams. Instructions for women Get screened regularly for cervical cancer. This type of cancer is slow growing and can almost always be cured if it is found early. If you become pregnant, tell your health care provider that you have had an HPV infection. Your health care provider will monitor you closely during pregnancy. General instructions Do not touch or scratch the warts. Do not have sex until your treatment has been completed. Tell your current and past sexual partners about your condition because they may also need treatment. After treatment, use condoms during sex to prevent future infections. Keep all follow-up visits as told by your health care provider. This is important. How is this prevented? Talk with your health care provider about getting the HPV vaccine. The vaccine: Can prevent some HPV infections and cancers. Is recommended for males and females who are 59-35 years old. Is not recommended for pregnant women. Will not work if you already have HPV. Contact a health care provider if you: Have redness, swelling, or pain in the area of the treated skin. Have a fever.  Feel generally ill. Feel lumps in and around your genital or anal area. Have bleeding in your genital or anal area. Have pain during sex or bleeding after sex. Summary Genital warts are a common STI (sexually transmitted infection). It may  appear as small bumps on the genital and anal areas. This condition is caused by a virus that is called human papillomavirus (HPV). HPV is spread by having unprotected sex with an infected person. It can be spread through vaginal, anal, and oral sex. Treatment is important because genital warts can lead to other problems. In females, the virus that causes genital warts may increase the risk for cervical cancer. This condition may be treated with medicine that is applied to the skin or procedures to remove the warts. The HPV vaccine can prevent some HPV infections and cancers. It is recommended that the vaccine be given to males and females who are 38-75 years old. This information is not intended to replace advice given to you by your health care provider. Make sure you discuss any questions you have with your health care provider. Document Revised: 02/17/2019 Document Reviewed: 02/17/2019 Elsevier Patient Education  2023 [employer]. [name], I greatly value your feedback.  If you receive a survey following your visit with [place] today, we appreciate you taking the time to fill it out.  Thanks, Cathie Beams, CNM     [facility] HAS MOVED!!! It is now [facility] at [facility] ([address] [place], [place] [address]) Entrance located off of [address] Free 24/7 valet parking   Go to [email or web address] to register for FREE online childbirth classes    Second Trimester of Pregnancy The second trimester is from week 14 through week 27 (months 4 through 6). The second trimester is often a time when you feel your best. Your body has adjusted to being pregnant, and you begin to feel better physically. Usually, morning sickness has lessened or quit completely, you may have more energy, and you may have an increase in appetite. The second trimester is also a time when the fetus is growing rapidly. At the end of the sixth month, the fetus is about 9 inches  long and weighs about 1 pounds. You will likely begin to feel the baby move (quickening) between 16 and 20 weeks of pregnancy. Body changes during your second trimester Your body continues to go through many changes during your second trimester. The changes vary from woman to woman. Your weight will continue to increase. You will notice your lower abdomen bulging out. You may begin to get stretch marks on your hips, abdomen, and breasts. You may develop headaches that can be relieved by medicines. The medicines should be approved by your health care provider. You may urinate more often because the fetus is pressing on your bladder. You may develop or continue to have heartburn as a result of your pregnancy. You may develop constipation because certain hormones are causing the muscles that push waste through your intestines to slow down. You may develop hemorrhoids or swollen, bulging veins (varicose veins). You may have back pain. This is caused by: Weight gain. Pregnancy hormones that are relaxing the joints in your pelvis. A shift in weight and the muscles that support your balance. Your breasts will continue to grow and they will continue to become tender. Your gums may bleed and may be sensitive to brushing and flossing. Dark spots or blotches (chloasma, mask of pregnancy) may develop on your face.  This will likely fade after the baby is born. A dark line from your belly button to the pubic area (linea nigra) may appear. This will likely fade after the baby is born. You may have changes in your hair. These can include thickening of your hair, rapid growth, and changes in texture. Some women also have hair loss during or after pregnancy, or hair that feels dry or thin. Your hair will most likely return to normal after your baby is born.  What to expect at prenatal visits During a routine prenatal visit: You will be weighed to make sure you and the fetus are growing normally. Your blood  pressure will be taken. Your abdomen will be measured to track your baby's growth. The fetal heartbeat will be listened to. Any test results from the previous visit will be discussed.  Your health care provider may ask you: How you are feeling. If you are feeling the baby move. If you have had any abnormal symptoms, such as leaking fluid, bleeding, severe headaches, or abdominal cramping. If you are using any tobacco products, including cigarettes, chewing tobacco, and electronic cigarettes. If you have any questions.  Other tests that may be performed during your second trimester include: Blood tests that check for: Low iron levels (anemia). High blood sugar that affects pregnant women (gestational diabetes) between 54 and 28 weeks. Rh antibodies. This is to check for a protein on red blood cells (Rh factor). Urine tests to check for infections, diabetes, or protein in the urine. An ultrasound to confirm the proper growth and development of the baby. An amniocentesis to check for possible genetic problems. Fetal screens for spina bifida and Down syndrome. HIV (human immunodeficiency virus) testing. Routine prenatal testing includes screening for HIV, unless you choose not to have this test.  Follow these instructions at home: Medicines Follow your health care provider's instructions regarding medicine use. Specific medicines may be either safe or unsafe to take during pregnancy. Take a prenatal vitamin that contains at least 600 micrograms (mcg) of folic acid. If you develop constipation, try taking a stool softener if your health care provider approves. Eating and drinking Eat a balanced diet that includes fresh fruits and vegetables, whole grains, good sources of protein such as meat, eggs, or tofu, and low-fat dairy. Your health care provider will help you determine the amount of weight gain that is right for you. Avoid raw meat and uncooked cheese. These carry germs that can cause  birth defects in the baby. If you have low calcium intake from food, talk to your health care provider about whether you should take a daily calcium supplement. Limit foods that are high in fat and processed sugars, such as fried and sweet foods. To prevent constipation: Drink enough fluid to keep your urine clear or pale yellow. Eat foods that are high in fiber, such as fresh fruits and vegetables, whole grains, and beans. Activity Exercise only as directed by your health care provider. Most women can continue their usual exercise routine during pregnancy. Try to exercise for 30 minutes at least 5 days a week. Stop exercising if you experience uterine contractions. Avoid heavy lifting, wear low heel shoes, and practice good posture. A sexual relationship may be continued unless your health care provider directs you otherwise. Relieving pain and discomfort Wear a good support bra to prevent discomfort from breast tenderness. Take warm sitz baths to soothe any pain or discomfort caused by hemorrhoids. Use hemorrhoid cream if your health care provider approves.  Rest with your legs elevated if you have leg cramps or low back pain. If you develop varicose veins, wear support hose. Elevate your feet for 15 minutes, 3-4 times a day. Limit salt in your diet. Prenatal Care Write down your questions. Take them to your prenatal visits. Keep all your prenatal visits as told by your health care provider. This is important. Safety Wear your seat belt at all times when driving. Make a list of emergency phone numbers, including numbers for family, friends, the hospital, and police and fire departments. General instructions Ask your health care provider for a referral to a local prenatal education class. Begin classes no later than the beginning of month 6 of your pregnancy. Ask for help if you have counseling or nutritional needs during pregnancy. Your health care provider can offer advice or refer you to  specialists for help with various needs. Do not use hot tubs, steam rooms, or saunas. Do not douche or use tampons or scented sanitary pads. Do not cross your legs for long periods of time. Avoid cat litter boxes and soil used by cats. These carry germs that can cause birth defects in the baby and possibly loss of the fetus by miscarriage or stillbirth. Avoid all smoking, herbs, alcohol, and unprescribed drugs. Chemicals in these products can affect the formation and growth of the baby. Do not use any products that contain nicotine or tobacco, such as cigarettes and e-cigarettes. If you need help quitting, ask your health care provider. Visit your dentist if you have not gone yet during your pregnancy. Use a soft toothbrush to brush your teeth and be gentle when you floss. Contact a health care provider if: You have dizziness. You have mild pelvic cramps, pelvic pressure, or nagging pain in the abdominal area. You have persistent nausea, vomiting, or diarrhea. You have a bad smelling vaginal discharge. You have pain when you urinate. Get help right away if: You have a fever. You are leaking fluid from your vagina. You have spotting or bleeding from your vagina. You have severe abdominal cramping or pain. You have rapid weight gain or weight loss. You have shortness of breath with chest pain. You notice sudden or extreme swelling of your face, hands, ankles, feet, or legs. You have not felt your baby move in over an hour. You have severe headaches that do not go away when you take medicine. You have vision changes. Summary The second trimester is from week 14 through week 27 (months 4 through 6). It is also a time when the fetus is growing rapidly. Your body goes through many changes during pregnancy. The changes vary from woman to woman. Avoid all smoking, herbs, alcohol, and unprescribed drugs. These chemicals affect the formation and growth your baby. Do not use any tobacco products,  such as cigarettes, chewing tobacco, and e-cigarettes. If you need help quitting, ask your health care provider. Contact your health care provider if you have any questions. Keep all prenatal visits as told by your health care provider. This is important. This information is not intended to replace advice given to you by your health care provider. Make sure you discuss any questions you have with your health care provider.

## 2022-03-31 NOTE — Progress Notes (Signed)
     WORK IN:  FOUND SEVERAL BUMPS ON VAGINA YESTERDAY  LOW-RISK PREGNANCY VISIT Patient name: Megan Tran MRN [id number]  Date of birth: [birth year] Chief Complaint:   Routine Prenatal Visit (Bumps on vagina)  History of Present Illness:   Megan Tran is a 31 y.o. G57P1001 female at [redacted]w[redacted]d with an Estimated Date of Delivery: 08/06/22 being seen today for ongoing management of a low-risk pregnancy. She noticed some vaginal "bumps" when showering, wondering if it is genital warts.  Both she and her partner have a history.  Denies pain/itch.  Review of Systems:   Pertinent items are noted in HPI Denies abnormal vaginal discharge w/ itching/odor/irritation, headaches, visual changes, shortness of breath, chest pain, abdominal pain, severe nausea/vomiting, or problems with urination or bowel movements unless otherwise stated above. Pertinent History Reviewed:  Reviewed past medical,surgical, social, obstetrical and family history.  Reviewed problem list, medications and allergies. Physical Assessment:   Vitals:   03/31/22 1551  BP: 99/62  Pulse: 75  Weight: 158 lb 3.2 oz (71.8 kg)  Body mass index is 27.15 kg/m.        Physical Examination:   General appearance: Well appearing, and in no distress  Mental status: Alert, oriented to person, place, and time  Skin: Warm & dry  Cardiovascular: Normal heart rate noted  Respiratory: Normal respiratory effort, no distress  Abdomen: Soft, gravid, nontender  Pelvic: Several small condylomas on introitus         Extremities: Edema: None  Fetal Status: Fetal Heart Rate (bpm): 140   Movement: Present    Chaperone:  Malachy Mood    No results found for this or any previous visit (from the past 24 hour(s)).  Assessment & Plan:  1) Low-risk pregnancy G2P1001 at [redacted]w[redacted]d with an Estimated Date of Delivery: 08/06/22   2) Condyloma, no treatment at this time   Meds: No orders of the defined types were placed in this  encounter.  Labs/procedures today: none  Plan:  Continue routine obstetrical care     Follow-up: Return for As scheduled.  Future Appointments  Date Time Provider Department Center  04/17/2022  9:10 AM Jacklyn Shell, CNM [facility]  08/18/2022  9:30 AM Dolores Frame, MD [facility] None    No orders of the defined types were placed in this encounter.  Jacklyn Shell DNP, CNM 03/31/2022 7:22 PM

## 2022-04-17 ENCOUNTER — Encounter: Payer: Self-pay | Admitting: Advanced Practice Midwife

## 2022-04-17 ENCOUNTER — Ambulatory Visit (INDEPENDENT_AMBULATORY_CARE_PROVIDER_SITE_OTHER): Payer: BC Managed Care – PPO | Admitting: Advanced Practice Midwife

## 2022-04-17 VITALS — BP 104/64 | HR 99 | Wt 159.5 lb

## 2022-04-17 DIAGNOSIS — Z3480 Encounter for supervision of other normal pregnancy, unspecified trimester: Secondary | ICD-10-CM

## 2022-04-17 DIAGNOSIS — Z3A24 24 weeks gestation of pregnancy: Secondary | ICD-10-CM

## 2022-04-17 NOTE — Patient Instructions (Addendum)
1. Before your test, do not eat or drink anything for 8-10 hours prior to your  appointment (a small amount of water is allowed and you may take any medicines you normally take). Be sure to drink lots of water the day before. 2. When you arrive, your blood will be drawn for a 'fasting' blood sugar level.  Then you will be given a sweetened carbonated beverage to drink. You should  complete drinking this beverage within five minutes. After finishing the  beverage, you will have your blood drawn exactly 1 and 2 hours later. Having  your blood drawn on time is an important part of this test. A total of three blood  samples will be done. 3. The test takes approximately 2  hours. During the test, do not have anything to  eat or drink. Do not smoke, chew gum (not even sugarless gum) or use breath mints.  4. During the test you should remain close by and seated as much as possible and  avoid walking around. You may want to bring a book or something else to  occupy your time.  5. After your test, you may eat and drink as normal. You may want to bring a snack  to eat after the test is finished. Your provider will advise you as to the results of  this test and any follow-up if necessary  If your sugar test is positive for gestational diabetes, you will be given an phone call and further instructions discussed. If you wish to know all of your test results before your next appointment, feel free to call the office, or look up your test results on Mychart.  (The range that the lab uses for normal values of the sugar test are not necessarily the range that is used for pregnant women; if your results are within the normal range, they are definitely normal.  However, if a value is deemed "high" by the lab, it may not be too high for a pregnant woman.  We will need to discuss the results if your value(s) fall in the "high" category).     Tdap Vaccine It is recommended that you get the Tdap vaccine during the  third trimester of EACH pregnancy to help protect your baby from getting pertussis (whooping cough) 27-36 weeks is the BEST time to do this so that you can pass the protection on to your baby. During pregnancy is better than after pregnancy, but if you are unable to get it during pregnancy it will be offered at the hospital. You will be offered this vaccine in the office after 27 weeks.  If you do not have health insurance, you can get the vaccine from the [facility] Department (no appointment needed).  Everyone who will be around your baby should also be up-to-date on their vaccines. Adults (who are not pregnant) only need 1 dose of Tdap during adulthood.  [phone] is the phone number for Pregnancy Classes or hospital tours at [facility].   You will be referred to  [email or web address]   for more information on childbirth classes   At this site you may register for classes. You may sign up for a waiting list if classes are full. Please SIGN UP FOR THIS!.   When the waiting list becomes long, sometimes new classes can be added.  [facility] at [facility] Call to Register: [phone] or [phone]   or   Register Online: [email or web address] THESE CLASSES FILL UP VERY QUICKLY,  SO SIGN UP AS SOON AS YOU CAN!!! Please visit [facility] pregnancy website at [email or web address]  Childbirth Classes  Option 1: Birth & Baby Series Series of 3 weekly classes, on the same day of the week (can choose Mon-Thurs) from 6-9pm Helps you and your support person prepare for childbirth Reviews newborn care, labor & birth, cesarean birth, pain management, and comfort techniques Cost: $60 per couple for insured or self-pay, $30 per couple for Medicaid  Option 2: Weekend Birth & Baby This class is a weekend version of our Birth & Baby series.  It is designed for parents who have  a difficult time fitting several weeks of classes into their schedule.   Covers the care of your newborn and the basics of labor and childbirth Friday 6:30pm-8:30pm Saturday 9am-4pm, includes lunch for you and your partner  Cost: $75 per couple for insured or self-pay, $30 per couple for Medicaid  Option 3: Natural Childbirth This series of 5 weekly classes is for expectant parents who want to learn and practice natural methods of coping with the process of labor and childbirth.  Can choose Mon or Tues, 7-9pm.   Covers relaxation, breathing, massage, visualization, role of the partner, and helpful positioning Participants learn how to be confident in their body's ability to give birth. Class empowers and helps parents make informed decisions about care. Includes discussion that will help new parents transition into the immediate postpartum period.  Cost: $75 per couple for insured or self-pay, $30 per couple for Medicaid  Option 4: Online Birth & Baby This online class offers you the freedom to complete a Birth & Baby series in the comfort of your own home.  The flexibility of this option allows you to review sections at your own pace, at times convenient to you and your support people.  It includes additional video information, animations, quizzes and extended activities. Get organized with helpful eClass tools, checklists, and trackers.  Cost: $60 for 60 days of online access                                                                            Other Available Classes  Baby & Me Enjoy this time to discuss newborn & infant parenting topics and family adjustment issues with other new mothers in a relaxed environment. Each week brings a new speaker or baby-centered activity. We encourage mothers and their babies (birth to crawling) to join [place]. You are welcome to visit this group even if you haven't delivered yet! It's wonderful to make new friends early and watch other moms interact with their  babies. No registration or fee.  Big Brother/Big Sister Let your children share in the joy of a new brother or sister in this special class designed just for them. Discussion includes how families care for babies: swaddling, holding, diapering, safety, as well as how they can be helpful in their new role. This class is designed for children ages 2 to 49, but any age is welcome. Please register each child individually. $5 Breastfeeding Support Group This group is a mother-to-mother support circle where moms have the opportunity to share their breastfeeding experiences. A Breastfeeding Support nurse is present for questions and concerns. An infant scale  is available for weight checks. No fee or registration.  Breastfeeding Your Baby Breastfeeding is a special time for mother and child. This class will help you feel ready to begin this important relationship. Your partner is encouraged to attend with you. Learn what to expect and feel more confident in the first days of breastfeeding your newborn. This class also addresses the most common fears and challenges of breastfeeding during the first few weeks, months, and beyond. $30 per couple Caring for Baby This class is for expectant and adoptive parents who want to learn and practice the most up-to-date newborn care for their babies. Focus is on birth through first six weeks of life. Topics include feeding, bathing, diapering, crying, umbilical cord care, circumcision care and safe sleep. Parents learn how to recognize symptoms of illness and when to call the pediatrician. Register only the mom-to-be and your partner can plan to come with you. (*Note: This class is included in the Birth & Baby series and the Weekend Birth & Baby classes.) $10 per couple Comfort Techniques & Tour This 2-hour interactive class is designed for those who either do not wish to take the Birth & Baby series or for those who prefer our online childbirth class, but don't want to miss the  opportunity to learn and practice hands-on techniques. These skills can help relieve some of the discomfort of labor and encourage your baby to rotate toward the best position for birth. You and your partner will be able to try a variety of labor positions with birth balls and rebozos as well as practice breathing, relaxation, and visual techniques. $20 per couple [employer] This course offers Dads-to-be the tools and knowledge needed to feel confident on their journey to becoming new fathers. Experienced dads, who have been trained as coaches, teach dads-to-be how to hold, comfort, diapers, swaddle and play with their infant while being able to support the new mom as well. $25 Grandparent Love Expecting a grandbaby? Learn about the latest infant care and safety recommendations and ways to support your own child as he or she transitions into the parenting role. $10 per person Infant and Child CPR Parents, grandparents, babysitters, and friends learn Cardio-Pulmonary Resuscitation skills for infants and children. You will also learn how to treat both conscious and unconscious choking infants and children. Register each participant individually. (Note: This Family & Friends program does not offer certification.) $20 per person Marvelous Multiples Expecting twins, triplets, or more? This free 2-hour class covers the differences in labor, birth, parenting, and breastfeeding issues that face multiples' parents.  Maternity Care Center Virtual Tour  Online virtual tour of the new [place] [facility] at [facility] Talk This free mom-led group offers support and connection to mothers as they journey through the adjustments and struggles of that sometimes overwhelming first year after the birth of a child. A member of our staff will be present to share resources and additional support if needed, as you care for yourself and baby. You are welcome to visit this group before you  deliver! It's wonderful to meet new friends early and watch other moms interact with their babies.  Waterbirth Class Interested in a waterbirth? This free informational class will help you discover whether waterbirth is the right fit for you and is required if you are planning a waterbirth. Education about waterbirth itself, supplies you may need, and what you may need from your support team is included in this class. Partners are  are encouraged to come.     

## 2022-04-17 NOTE — Progress Notes (Signed)
   LOW-RISK PREGNANCY VISIT Patient name: Megan Tran MRN [id number]  Date of birth: 1990-11-11 Chief Complaint:   Routine Prenatal Visit  History of Present Illness:   Megan Tran is a 31 y.o. G1P1001 female at [redacted]w[redacted]d with an Estimated Date of Delivery: 08/06/22 being seen today for ongoing management of a low-risk pregnancy.  Today she reports no complaints. Contractions: Not present. Vag. Bleeding: None.  Movement: Present. denies leaking of fluid. Review of Systems:   Pertinent items are noted in HPI Denies abnormal vaginal discharge w/ itching/odor/irritation, headaches, visual changes, shortness of breath, chest pain, abdominal pain, severe nausea/vomiting, or problems with urination or bowel movements unless otherwise stated above. Pertinent History Reviewed:  Reviewed past medical,surgical, social, obstetrical and family history.  Reviewed problem list, medications and allergies. Physical Assessment:   Vitals:   04/17/22 0913  BP: 104/64  Pulse: 99  Weight: 159 lb 8 oz (72.3 kg)  Body mass index is 27.38 kg/m.        Physical Examination:   General appearance: Well appearing, and in no distress  Mental status: Alert, oriented to person, place, and time  Skin: Warm & dry  Cardiovascular: Normal heart rate noted  Respiratory: Normal respiratory effort, no distress  Abdomen: Soft, gravid, nontender  Pelvic: Cervical exam deferred         Extremities:    Fetal Status: Fetal Heart Rate (bpm): 150 Fundal Height: 23 cm Movement: Present    Chaperone:  N/A    No results found for this or any previous visit (from the past 24 hour(s)).  Assessment & Plan:  1) Low-risk pregnancy G2P1001 at [redacted]w[redacted]d with an Estimated Date of Delivery: 08/06/22     Meds: No orders of the defined types were placed in this encounter.  Labs/procedures today: none  Plan:  Continue routine obstetrical care  Next visit: prefers will be in person for PN2     Reviewed: Preterm labor symptoms  and general obstetric precautions including but not limited to vaginal bleeding, contractions, leaking of fluid and fetal movement were reviewed in detail with the patient.  All questions were answered. Has home bp cuff. . Check bp weekly, let us know if >140/90.   Follow-up: Return in about 4 weeks (around 05/15/2022) for PN2/LROB.  Future Appointments  Date Time Provider Department Center  08/18/2022  9:30 AM Marguerita Merles, Reuel Boom, MD [facility] None    No orders of the defined types were placed in this encounter.  Jacklyn Shell DNP, CNM 04/17/2022 9:43 AM

## 2022-04-22 ENCOUNTER — Other Ambulatory Visit (INDEPENDENT_AMBULATORY_CARE_PROVIDER_SITE_OTHER): Payer: Self-pay | Admitting: Gastroenterology

## 2022-05-15 ENCOUNTER — Other Ambulatory Visit: Payer: BC Managed Care – PPO

## 2022-05-15 ENCOUNTER — Ambulatory Visit (INDEPENDENT_AMBULATORY_CARE_PROVIDER_SITE_OTHER): Payer: BC Managed Care – PPO | Admitting: Advanced Practice Midwife

## 2022-05-15 VITALS — BP 107/62 | HR 83 | Wt 162.0 lb

## 2022-05-15 DIAGNOSIS — Z23 Encounter for immunization: Secondary | ICD-10-CM

## 2022-05-15 DIAGNOSIS — Z1332 Encounter for screening for maternal depression: Secondary | ICD-10-CM | POA: Diagnosis not present

## 2022-05-15 DIAGNOSIS — Z131 Encounter for screening for diabetes mellitus: Secondary | ICD-10-CM | POA: Diagnosis not present

## 2022-05-15 DIAGNOSIS — Z3480 Encounter for supervision of other normal pregnancy, unspecified trimester: Secondary | ICD-10-CM

## 2022-05-15 DIAGNOSIS — Z3A28 28 weeks gestation of pregnancy: Secondary | ICD-10-CM

## 2022-05-15 DIAGNOSIS — O09293 Supervision of pregnancy with other poor reproductive or obstetric history, third trimester: Secondary | ICD-10-CM | POA: Diagnosis not present

## 2022-05-15 NOTE — Addendum Note (Signed)
Addended by: Christin Fudge on: 05/15/2022 10:44 AM   Modules accepted: Orders

## 2022-05-15 NOTE — Progress Notes (Signed)
   LOW-RISK PREGNANCY VISIT Patient name: Megan Tran MRN [id number]  Date of birth: 10/14/1990 Chief Complaint:   Routine Prenatal Visit  History of Present Illness:   Megan Tran is a 32 y.o. G59P1001 female at [redacted]w[redacted]d with an Estimated Date of Delivery: 08/06/22 being seen today for ongoing management of a low-risk pregnancy.  Today she reports no complaints. Contractions: Not present. Vag. Bleeding: None.  Movement: Present. denies leaking of fluid. Review of Systems:   Pertinent items are noted in HPI Denies abnormal vaginal discharge w/ itching/odor/irritation, headaches, visual changes, shortness of breath, chest pain, abdominal pain, severe nausea/vomiting, or problems with urination or bowel movements unless otherwise stated above. Pertinent History Reviewed:  Reviewed past medical,surgical, social, obstetrical and family history.  Reviewed problem list, medications and allergies. Physical Assessment:   Vitals:   05/15/22 0951  BP: 107/62  Pulse: 83  Weight: 162 lb (73.5 kg)  Body mass index is 27.81 kg/m.        Physical Examination:   General appearance: Well appearing, and in no distress  Mental status: Alert, oriented to person, place, and time  Skin: Warm & dry  Cardiovascular: Normal heart rate noted  Respiratory: Normal respiratory effort, no distress  Abdomen: Soft, gravid, nontender  Pelvic: Cervical exam deferred         Extremities:    Fetal Status: Fetal Heart Rate (bpm): 142 Fundal Height: 28 cm Movement: Present    Chaperone:  N/A    No results found for this or any previous visit (from the past 24 hour(s)).  Assessment & Plan:  1) Low-risk pregnancy G2P1001 at [redacted]w[redacted]d with an Estimated Date of Delivery: 08/06/22   2) Hx 4th degree, plan EFW and physical exam around 36 weeks.  Pt much prefers a vaginal birth over CS   Meds: No orders of the defined types were placed in this encounter.  Labs/procedures today: PN2  Plan:  Continue routine  obstetrical care  Next visit: prefers in person    Reviewed: Preterm labor symptoms and general obstetric precautions including but not limited to vaginal bleeding, contractions, leaking of fluid and fetal movement were reviewed in detail with the patient.  All questions were answered. Hsa home bp cuff.. Check bp weekly, let us know if >140/90.   Follow-up: No follow-ups on file.  Future Appointments  Date Time Provider [place]  08/18/2022  9:30 AM Montez Morita, Quillian Quince, MD [facility] None    Orders Placed This Encounter  Procedures   Tdap vaccine greater than or equal to 7yo IM   Christin Fudge DNP, CNM 05/15/2022 10:26 AM

## 2022-05-16 LAB — CBC
Hematocrit: 35.3 % (ref 34.0–46.6)
Hemoglobin: 11.9 g/dL (ref 11.1–15.9)
MCH: 30.3 pg (ref 26.6–33.0)
MCHC: 33.7 g/dL (ref 31.5–35.7)
MCV: 90 fL (ref 79–97)
Platelets: 207 10*3/uL (ref 150–450)
RBC: 3.93 x10E6/uL (ref 3.77–5.28)
RDW: 12.9 % (ref 11.7–15.4)
WBC: 7 10*3/uL (ref 3.4–10.8)

## 2022-05-16 LAB — ANTIBODY SCREEN: Antibody Screen: NEGATIVE

## 2022-05-16 LAB — GLUCOSE TOLERANCE, 2 HOURS W/ 1HR
Glucose, 1 hour: 96 mg/dL (ref 70–179)
Glucose, 2 hour: 105 mg/dL (ref 70–152)
Glucose, Fasting: 74 mg/dL (ref 70–91)

## 2022-05-16 LAB — HIV ANTIBODY (ROUTINE TESTING W REFLEX): HIV Screen 4th Generation wRfx: NONREACTIVE

## 2022-05-16 LAB — RPR: RPR Ser Ql: NONREACTIVE

## 2022-05-19 ENCOUNTER — Encounter: Payer: Self-pay | Admitting: *Deleted

## 2022-06-05 ENCOUNTER — Ambulatory Visit (INDEPENDENT_AMBULATORY_CARE_PROVIDER_SITE_OTHER): Payer: BC Managed Care – PPO | Admitting: Obstetrics & Gynecology

## 2022-06-05 ENCOUNTER — Encounter: Payer: Self-pay | Admitting: Obstetrics & Gynecology

## 2022-06-05 VITALS — BP 111/67 | HR 113 | Wt 164.2 lb

## 2022-06-05 DIAGNOSIS — Z3483 Encounter for supervision of other normal pregnancy, third trimester: Secondary | ICD-10-CM

## 2022-06-05 DIAGNOSIS — Z3A31 31 weeks gestation of pregnancy: Secondary | ICD-10-CM

## 2022-06-05 NOTE — Progress Notes (Signed)
   LOW-RISK PREGNANCY VISIT Patient name: Megan Tran MRN [id number]  Date of birth: May 25, 1990 Chief Complaint:   Routine Prenatal Visit  History of Present Illness:   Megan Tran is a 32 y.o. G21P1001 female at [redacted]w[redacted]d with an Estimated Date of Delivery: 08/06/22 being seen today for ongoing management of a low-risk pregnancy.   -prior 4th deg -IBS    05/15/2022    9:57 AM 01/27/2022    9:55 AM 04/19/2018    4:07 PM 03/23/2018    4:25 PM  Depression screen PHQ 2/9  Decreased Interest 0 0 0 0  Down, Depressed, Hopeless 0 0 0 0  PHQ - 2 Score 0 0 0 0  Altered sleeping 0 0    Tired, decreased energy 2 1    Change in appetite 0 1    Feeling bad or failure about yourself  0 0    Trouble concentrating 0 0    Moving slowly or fidgety/restless 0 0    Suicidal thoughts 0 0    PHQ-9 Score 2 2      Today she reports no complaints. Contractions: Irritability. Vag. Bleeding: None.  Movement: Present. denies leaking of fluid. Review of Systems:   Pertinent items are noted in HPI Denies abnormal vaginal discharge w/ itching/odor/irritation, headaches, visual changes, shortness of breath, chest pain, abdominal pain, severe nausea/vomiting, or problems with urination or bowel movements unless otherwise stated above. Pertinent History Reviewed:  Reviewed past medical,surgical, social, obstetrical and family history.  Reviewed problem list, medications and allergies.  Physical Assessment:   Vitals:   06/05/22 0854  BP: 111/67  Pulse: (!) 113  Weight: 164 lb 3.2 oz (74.5 kg)  Body mass index is 28.18 kg/m.        Physical Examination:   General appearance: Well appearing, and in no distress  Mental status: Alert, oriented to person, place, and time  Skin: Warm & dry  Respiratory: Normal respiratory effort, no distress  Abdomen: Soft, gravid, nontender  Pelvic: Cervical exam deferred         Extremities: Edema: None  Psych:  mood and affect appropriate  Fetal Status: Fetal  Heart Rate (bpm): 150 Fundal Height: 31 cm Movement: Present    Chaperone: n/a    No results found for this or any previous visit (from the past 24 hour(s)).   Assessment & Plan:  1) Low-risk pregnancy G2P1001 at [redacted]w[redacted]d with an Estimated Date of Delivery: 08/06/22   2) h/o 4th degree laceration Reviewed MOD Pt desires at least 4 children Reviewed pros/cons of C-section vs vaginal delivery Growth scan scheduled for 36wk, would encourage pt to consider trial of labor if EFW AGA   Meds: No orders of the defined types were placed in this encounter.  Labs/procedures today: none  Plan:  Continue routine obstetrical care  Next visit: prefers in person    Reviewed: Preterm labor symptoms and general obstetric precautions including but not limited to vaginal bleeding, contractions, leaking of fluid and fetal movement were reviewed in detail with the patient.  All questions were answered. Pt has home bp cuff. Check bp weekly, let us know if >140/90.   Follow-up: Return in about 2 weeks (around 06/19/2022) for [place] visit.  No orders of the defined types were placed in this encounter.   Janyth Pupa, DO Attending [place], [facility] for [employer], [place]

## 2022-06-11 ENCOUNTER — Encounter: Payer: Self-pay | Admitting: Women's Health

## 2022-06-18 ENCOUNTER — Encounter: Payer: Self-pay | Admitting: *Deleted

## 2022-06-19 ENCOUNTER — Ambulatory Visit (INDEPENDENT_AMBULATORY_CARE_PROVIDER_SITE_OTHER): Payer: BC Managed Care – PPO | Admitting: Obstetrics and Gynecology

## 2022-06-19 ENCOUNTER — Encounter: Payer: Self-pay | Admitting: Obstetrics and Gynecology

## 2022-06-19 VITALS — BP 114/71 | HR 110 | Wt 167.0 lb

## 2022-06-19 DIAGNOSIS — Z3480 Encounter for supervision of other normal pregnancy, unspecified trimester: Secondary | ICD-10-CM

## 2022-06-19 DIAGNOSIS — O09293 Supervision of pregnancy with other poor reproductive or obstetric history, third trimester: Secondary | ICD-10-CM

## 2022-06-19 DIAGNOSIS — Z3A33 33 weeks gestation of pregnancy: Secondary | ICD-10-CM

## 2022-06-19 DIAGNOSIS — O09299 Supervision of pregnancy with other poor reproductive or obstetric history, unspecified trimester: Secondary | ICD-10-CM | POA: Insufficient documentation

## 2022-06-19 NOTE — Progress Notes (Signed)
Subjective:  Megan Tran is a 32 y.o. G2P1001 at 74w1dbeing seen today for ongoing prenatal care.  She is currently monitored for the following issues for this low-risk pregnancy and has Gastroesophageal reflux disease without esophagitis; Bloating; Constipation; Abnormal Pap smear of cervix; Encounter for supervision of normal pregnancy, antepartum; Condyloma acuminata of vulva during pregnancy in second trimester; and H/O maternal fourth degree perineal laceration, currently pregnant on their problem list.  Patient reports no complaints.  Contractions: Irregular. Vag. Bleeding: None.  Movement: Present. Denies leaking of fluid.   The following portions of the patient's history were reviewed and updated as appropriate: allergies, current medications, past family history, past medical history, past social history, past surgical history and problem list. Problem list updated.  Objective:   Vitals:   06/19/22 0840  BP: 114/71  Pulse: (!) 110  Weight: 167 lb (75.8 kg)    Fetal Status:     Movement: Present     General:  Alert, oriented and cooperative. Patient is in no acute distress.  Skin: Skin is warm and dry. No rash noted.   Cardiovascular: Normal heart rate noted  Respiratory: Normal respiratory effort, no problems with respiration noted  Abdomen: Soft, gravid, appropriate for gestational age. Pain/Pressure: Absent     Pelvic:  Cervical exam deferred        Extremities: Normal range of motion.  Edema: None  Mental Status: Normal mood and affect. Normal behavior. Normal judgment and thought content.   Urinalysis:      Assessment and Plan:  Pregnancy: G2P1001 at 348w1d1. Supervision of other normal pregnancy, antepartum Stable  2. H/O maternal fourth degree perineal laceration, currently pregnant Growth next month  Preterm labor symptoms and general obstetric precautions including but not limited to vaginal bleeding, contractions, leaking of fluid and fetal movement  were reviewed in detail with the patient. Please refer to After Visit Summary for other counseling recommendations.  Return in about 2 weeks (around 07/03/2022) for OB visit, face to face, any provider.   ErChancy MilroyMD

## 2022-07-03 ENCOUNTER — Encounter: Payer: Self-pay | Admitting: Advanced Practice Midwife

## 2022-07-03 ENCOUNTER — Ambulatory Visit (INDEPENDENT_AMBULATORY_CARE_PROVIDER_SITE_OTHER): Payer: BC Managed Care – PPO | Admitting: Advanced Practice Midwife

## 2022-07-03 VITALS — BP 98/62 | HR 96 | Wt 167.2 lb

## 2022-07-03 DIAGNOSIS — Z3A35 35 weeks gestation of pregnancy: Secondary | ICD-10-CM

## 2022-07-03 DIAGNOSIS — Z3480 Encounter for supervision of other normal pregnancy, unspecified trimester: Secondary | ICD-10-CM

## 2022-07-03 DIAGNOSIS — O09293 Supervision of pregnancy with other poor reproductive or obstetric history, third trimester: Secondary | ICD-10-CM

## 2022-07-03 DIAGNOSIS — O09299 Supervision of pregnancy with other poor reproductive or obstetric history, unspecified trimester: Secondary | ICD-10-CM

## 2022-07-03 MED ORDER — MONTELUKAST SODIUM 4 MG PO CHEW: 4.0000 mg | CHEWABLE_TABLET | Freq: Every day | ORAL | 2 refills | Status: DC

## 2022-07-03 NOTE — Progress Notes (Signed)
   LOW-RISK PREGNANCY VISIT Patient name: Megan Tran MRN [id number]  Date of birth: 01-Feb-1991 Chief Complaint:   No chief complaint on file.  History of Present Illness:   Megan Tran is a 32 y.o. G52P1001 female at [redacted]w[redacted]d with an Estimated Date of Delivery: 08/06/22 being seen today for ongoing management of a low-risk pregnancy.  Today she reports allergies are starting to act up, normally takes Singulair. Contractions: Irregular.  .  Movement: Present. denies leaking of fluid. Review of Systems:   Pertinent items are noted in HPI Denies abnormal vaginal discharge w/ itching/odor/irritation, headaches, visual changes, shortness of breath, chest pain, abdominal pain, severe nausea/vomiting, or problems with urination or bowel movements unless otherwise stated above. Pertinent History Reviewed:  Reviewed past medical,surgical, social, obstetrical and family history.  Reviewed problem list, medications and allergies. Physical Assessment:   Vitals:   07/03/22 0910  BP: 98/62  Pulse: 96  Weight: 167 lb 3.2 oz (75.8 kg)  Body mass index is 28.7 kg/m.        Physical Examination:   General appearance: Well appearing, and in no distress  Mental status: Alert, oriented to person, place, and time  Skin: Warm & dry  Cardiovascular: Normal heart rate noted  Respiratory: Normal respiratory effort, no distress  Abdomen: Soft, gravid, nontender  Pelvic: Cervical exam deferred         Extremities: Edema: None  Fetal Status: Fetal Heart Rate (bpm): 142 Fundal Height: 35 cm Movement: Present    Chaperone:  N/A    No results found for this or any previous visit (from the past 24 hour(s)).  Assessment & Plan:    Pregnancy: G2P1001 at [redacted]w[redacted]d 1. Supervision of other normal pregnancy, antepartum    2. [redacted] weeks gestation of pregnancy  3>  Hx 4th degree lac:  EFW already scheduled, but pt states that she now would like a primary CS. Risks/benefits discussed.  Has appt w/[facility] next  week, will discuss/schedule     Meds:  Meds ordered this encounter  Medications   montelukast (SINGULAIR) 4 MG chewable tablet    Sig: Chew 1 tablet (4 mg total) by mouth at bedtime.    Dispense:  90 tablet    Refill:  2    Order Specific Question:   Supervising Provider    Answer:   Florian Buff [[id number]]   Labs/procedures today:   Plan:  Continue routine obstetrical care  Next visit: prefers in person    Reviewed: Preterm labor symptoms and general obstetric precautions including but not limited to vaginal bleeding, contractions, leaking of fluid and fetal movement were reviewed in detail with the patient.  All questions were answered. Has home bp cuff. Check bp weekly, let us know if >140/90.   Follow-up: Return for As scheduled.  Future Appointments  Date Time Provider [place]  07/10/2022  9:15 AM [facility] - FTO[facility]YN [place] [facility] None  07/10/2022 10:10 AM Florian Buff, MD [facility]  08/18/2022  9:30 AM Harvel Quale, MD NRE-NRE None    No orders of the defined types were placed in this encounter.  Christin Fudge DNP, CNM 07/03/2022 9:37 AM

## 2022-07-03 NOTE — Patient Instructions (Signed)
Leonie Man, I greatly value your feedback.  If you receive a survey following your visit with [place] today, we appreciate you taking the time to fill it out.  Thanks, Nigel Berthold, DNP, CNM  [place]!!! It is now [place] at [facility] ([place], [place] [address]) Entrance located off of [place] parking   Go to [email or web address] to register for FREE online childbirth classes    Call the office [phone]) or go to [place] if: You begin to have strong, frequent contractions Your water breaks.  Sometimes it is a big gush of fluid, sometimes it is just a trickle that keeps getting your panties wet or running down your legs You have vaginal bleeding.  It is normal to have a small amount of spotting if your cervix was checked.  You don't feel your baby moving like normal.  If you don't, get you something to eat and drink and lay down and focus on feeling your baby move.  You should feel at least 10 movements in 2 hours.  If you don't, you should call the office or go to [place] Blood Pressure Monitoring for Patients   Your provider has recommended that you check your blood pressure (BP) at least once a week at home. If you do not have a blood pressure cuff at home, one will be provided for you. Contact your provider if you have not received your monitor within 1 week.   Helpful Tips for Accurate Home Blood Pressure Checks  Don't smoke, exercise, or drink caffeine 30 minutes before checking your BP Use the restroom before checking your BP (a full bladder can raise your pressure) Relax in a comfortable upright chair Feet on the ground Left arm resting comfortably on a flat surface at the level of your heart Legs uncrossed Back supported Sit quietly and don't talk Place the cuff on your bare arm Adjust snuggly, so that only two fingertips can fit between your skin and the  top of the cuff Check 2 readings separated by at least one minute Keep a log of your BP readings For a visual, please reference this diagram: http://ccnc.care/bpdiagram  Provider Name: [facility]     Phone: [phone]  Zone 1: ALL CLEAR  Continue to monitor your symptoms:  BP reading is less than 140 (top number) or less than 90 (bottom number)  No right upper stomach pain No headaches or seeing spots No feeling nauseated or throwing up No swelling in face and hands  Zone 2: CAUTION Call your doctor's office for any of the following:  BP reading is greater than 140 (top number) or greater than 90 (bottom number)  Stomach pain under your ribs in the middle or right side Headaches or seeing spots Feeling nauseated or throwing up Swelling in face and hands  Zone 3: EMERGENCY  Seek immediate medical care if you have any of the following:  BP reading is greater than160 (top number) or greater than 110 (bottom number) Severe headaches not improving with Tylenol Serious difficulty catching your breath Any worsening symptoms from Zone 2

## 2022-07-10 ENCOUNTER — Ambulatory Visit (INDEPENDENT_AMBULATORY_CARE_PROVIDER_SITE_OTHER): Payer: BC Managed Care – PPO | Admitting: Obstetrics & Gynecology

## 2022-07-10 ENCOUNTER — Encounter: Payer: Self-pay | Admitting: Obstetrics & Gynecology

## 2022-07-10 ENCOUNTER — Ambulatory Visit (INDEPENDENT_AMBULATORY_CARE_PROVIDER_SITE_OTHER): Payer: BC Managed Care – PPO

## 2022-07-10 ENCOUNTER — Other Ambulatory Visit (HOSPITAL_COMMUNITY)
Admission: RE | Admit: 2022-07-10 | Discharge: 2022-07-10 | Disposition: A | Discharge status: Home / Self Care | Payer: BC Managed Care – PPO | Source: Ambulatory Visit | Attending: Obstetrics & Gynecology | Admitting: Obstetrics & Gynecology

## 2022-07-10 VITALS — BP 112/67 | HR 91 | Wt 167.0 lb

## 2022-07-10 DIAGNOSIS — Z3483 Encounter for supervision of other normal pregnancy, third trimester: Secondary | ICD-10-CM | POA: Diagnosis not present

## 2022-07-10 DIAGNOSIS — Z3A36 36 weeks gestation of pregnancy: Secondary | ICD-10-CM | POA: Insufficient documentation

## 2022-07-10 DIAGNOSIS — O09293 Supervision of pregnancy with other poor reproductive or obstetric history, third trimester: Secondary | ICD-10-CM

## 2022-07-10 DIAGNOSIS — O3660X0 Maternal care for excessive fetal growth, unspecified trimester, not applicable or unspecified: Secondary | ICD-10-CM

## 2022-07-10 DIAGNOSIS — O09299 Supervision of pregnancy with other poor reproductive or obstetric history, unspecified trimester: Secondary | ICD-10-CM

## 2022-07-10 DIAGNOSIS — Z3480 Encounter for supervision of other normal pregnancy, unspecified trimester: Secondary | ICD-10-CM

## 2022-07-10 DIAGNOSIS — O3663X0 Maternal care for excessive fetal growth, third trimester, not applicable or unspecified: Secondary | ICD-10-CM

## 2022-07-10 NOTE — Progress Notes (Signed)
   LOW-RISK PREGNANCY VISIT Patient name: Megan Tran MRN [id number]  Date of birth: 1990-11-01 Chief Complaint:   Routine Prenatal Visit  History of Present Illness:   Megan Tran is a 32 y.o. G74P1001 female at [redacted]w[redacted]d with an Estimated Date of Delivery: 08/06/22 being seen today for ongoing management of a low-risk pregnancy.     05/15/2022    9:57 AM 01/27/2022    9:55 AM 04/19/2018    4:07 PM 03/23/2018    4:25 PM  Depression screen PHQ 2/9  Decreased Interest 0 0 0 0  Down, Depressed, Hopeless 0 0 0 0  PHQ - 2 Score 0 0 0 0  Altered sleeping 0 0    Tired, decreased energy 2 1    Change in appetite 0 1    Feeling bad or failure about yourself  0 0    Trouble concentrating 0 0    Moving slowly or fidgety/restless 0 0    Suicidal thoughts 0 0    PHQ-9 Score 2 2      Today she reports no complaints. Contractions: Not present. Vag. Bleeding: None.  Movement: Present. denies leaking of fluid. Review of Systems:   Pertinent items are noted in HPI Denies abnormal vaginal discharge w/ itching/odor/irritation, headaches, visual changes, shortness of breath, chest pain, abdominal pain, severe nausea/vomiting, or problems with urination or bowel movements unless otherwise stated above. Pertinent History Reviewed:  Reviewed past medical,surgical, social, obstetrical and family history.  Reviewed problem list, medications and allergies. Physical Assessment:   Vitals:   07/10/22 0953  BP: 112/67  Pulse: 91  Weight: 167 lb (75.8 kg)  Body mass index is 28.67 kg/m.        Physical Examination:   General appearance: Well appearing, and in no distress  Mental status: Alert, oriented to person, place, and time  Skin: Warm & dry  Cardiovascular: Normal heart rate noted  Respiratory: Normal respiratory effort, no distress  Abdomen: Soft, gravid, nontender  Pelvic: Cervical exam performed  LTC soft       Extremities: Edema: Trace  Fetal Status:     Movement: Present     Chaperone:Amanda Andrews  No results found for this or any previous visit (from the past 24 hour(s)).  Assessment & Plan:  1) Low-risk pregnancy G2P1001 at [redacted]w[redacted]d with an Estimated Date of Delivery: 08/06/22   2) Hx of 4th degree laceration and with EFW 94% pt wants to have a primary Caesarean section, scheduled 08/02/22 0730 am   Meds: No orders of the defined types were placed in this encounter.  Labs/procedures today: sonogram  Plan:  Continue routine obstetrical care  Next visit: prefers in person    Reviewed: Term labor symptoms and general obstetric precautions including but not limited to vaginal bleeding, contractions, leaking of fluid and fetal movement were reviewed in detail with the patient.  All questions were answered. Has home bp cuff. Rx faxed to . Check bp weekly, let us know if >140/90.   Follow-up: Return in about 1 week (around 07/17/2022) for [place].  Orders Placed This Encounter  Procedures   Culture, beta strep (group b only)    Florian Buff, MD 07/10/2022 10:53 AM

## 2022-07-10 NOTE — Progress Notes (Signed)
[place] [id number] wks,cephalic,posterior placenta gr 1,fhr 152 bpm,AFI 13.6 cm,EFW 3424 g 94%

## 2022-07-11 LAB — CERVICOVAGINAL ANCILLARY ONLY
Chlamydia: NEGATIVE
Comment: NEGATIVE
Comment: NORMAL
Neisseria Gonorrhea: NEGATIVE

## 2022-07-14 LAB — CULTURE, BETA STREP (GROUP B ONLY): Strep Gp B Culture: NEGATIVE

## 2022-07-17 ENCOUNTER — Ambulatory Visit (INDEPENDENT_AMBULATORY_CARE_PROVIDER_SITE_OTHER): Payer: BC Managed Care – PPO | Admitting: Obstetrics & Gynecology

## 2022-07-17 VITALS — BP 105/65 | HR 94 | Wt 168.6 lb

## 2022-07-17 DIAGNOSIS — Z3A37 37 weeks gestation of pregnancy: Secondary | ICD-10-CM

## 2022-07-17 DIAGNOSIS — O09299 Supervision of pregnancy with other poor reproductive or obstetric history, unspecified trimester: Secondary | ICD-10-CM

## 2022-07-17 DIAGNOSIS — O09293 Supervision of pregnancy with other poor reproductive or obstetric history, third trimester: Secondary | ICD-10-CM

## 2022-07-17 DIAGNOSIS — O3663X0 Maternal care for excessive fetal growth, third trimester, not applicable or unspecified: Secondary | ICD-10-CM

## 2022-07-17 DIAGNOSIS — Z3483 Encounter for supervision of other normal pregnancy, third trimester: Secondary | ICD-10-CM

## 2022-07-17 DIAGNOSIS — O3660X0 Maternal care for excessive fetal growth, unspecified trimester, not applicable or unspecified: Secondary | ICD-10-CM

## 2022-07-17 NOTE — Progress Notes (Signed)
HIGH-RISK PREGNANCY VISIT Patient name: Megan Tran MRN [id number]  Date of birth: 1990/11/18 Chief Complaint:   Routine Prenatal Visit  History of Present Illness:   Megan Tran is a 32 y.o. G66P1001 female at [redacted]w[redacted]d with an Estimated Date of Delivery: 08/06/22 being seen today for ongoing management of a high-risk pregnancy complicated by:  -Prior 4th deg/VAVD and LGA- desires primary C-section  Today she reports no complaints.   Contractions: Irritability. Vag. Bleeding: None.  Movement: Present. denies leaking of fluid.      05/15/2022    9:57 AM 01/27/2022    9:55 AM 04/19/2018    4:07 PM 03/23/2018    4:25 PM  Depression screen PHQ 2/9  Decreased Interest 0 0 0 0  Down, Depressed, Hopeless 0 0 0 0  PHQ - 2 Score 0 0 0 0  Altered sleeping 0 0    Tired, decreased energy 2 1    Change in appetite 0 1    Feeling bad or failure about yourself  0 0    Trouble concentrating 0 0    Moving slowly or fidgety/restless 0 0    Suicidal thoughts 0 0    PHQ-9 Score 2 2       Current Outpatient Medications  Medication Instructions   calcium carbonate (TUMS - DOSED IN MG ELEMENTAL CALCIUM) 500 MG chewable tablet 1 tablet   montelukast (SINGULAIR) 4 mg, Oral, Daily at bedtime   OVER THE COUNTER MEDICATION Stool softner as needed.   pantoprazole (PROTONIX) 40 mg, Oral, 2 times daily   Prenatal Vit-Fe Fumarate-FA (PRENATAL VITAMIN PO) Oral   Simethicone (GAS-X PO) Oral     Review of Systems:   Pertinent items are noted in HPI Denies abnormal vaginal discharge w/ itching/odor/irritation, headaches, visual changes, shortness of breath, chest pain, abdominal pain, severe nausea/vomiting, or problems with urination or bowel movements unless otherwise stated above. Pertinent History Reviewed:  Reviewed past medical,surgical, social, obstetrical and family history.  Reviewed problem list, medications and allergies. Physical Assessment:   Vitals:   07/17/22 1520  BP: 105/65   Pulse: 94  Weight: 168 lb 9.6 oz (76.5 kg)  Body mass index is 28.94 kg/m.           Physical Examination:   General appearance: alert, well appearing, and in no distress  Mental status: normal mood, behavior, speech, dress, motor activity, and thought processes  Skin: warm & dry   Extremities:      Cardiovascular: normal heart rate noted  Respiratory: normal respiratory effort, no distress  Abdomen: gravid, soft, non-tender  Pelvic: Cervical exam deferred         Fetal Status: Fetal Heart Rate (bpm): 150 Fundal Height: 38 cm Movement: Present    Fetal Surveillance Testing today: doppler   Chaperone: N/A    No results found for this or any previous visit (from the past 24 hour(s)).   Assessment & Plan:  High-risk pregnancy: G2P1001 at [redacted]w[redacted]d with an Estimated Date of Delivery: 08/06/22   1) LGA with h/o 4th degree P LTCS scheduled  2) IBS- no acute complaints today  Meds: No orders of the defined types were placed in this encounter.   Labs/procedures today: none  Treatment Plan:  continue routine OB care  Reviewed: Term labor symptoms and general obstetric precautions including but not limited to vaginal bleeding, contractions, leaking of fluid and fetal movement were reviewed in detail with the patient.  All questions were answered. Pt has home bp  cuff. Check bp weekly, let us know if >140/90.   Follow-up: Return for as scheduled.   Future Appointments  Date Time Provider [place]  07/24/2022  2:30 PM Christin Fudge, [place] [facility]-FT [facility]  07/31/2022  3:30 PM Christin Fudge, CNM [facility]-FT [facility]  08/06/2022  9:50 AM Myrtis Ser, CNM [facility]-FT [facility]  08/18/2022  9:30 AM Harvel Quale, MD NRE-NRE None    No orders of the defined types were placed in this encounter.   Janyth Pupa, DO Attending [place], [facility] for [employer], [place]

## 2022-07-21 NOTE — Patient Instructions (Signed)
Megan Tran  07/21/2022   Your procedure is scheduled on:  09/01/2022  Arrive at [place] at [employer] [facility] [address] on [address] at [facility]  and [address]. You are invited to use the FREE valet parking or use the Visitor's parking deck.  Pick up the phone at the desk and dial [phone].  Call this number if you have problems the morning of surgery: [phone]  Remember:   Do not eat food:(After Midnight) Desps de medianoche.  Do not drink clear liquids: (After Midnight) Desps de medianoche.  Take these medicines the morning of surgery with A SIP OF WATER:  none   Do not wear jewelry, make-up or nail polish.  Do not wear lotions, powders, or perfumes. Do not wear deodorant.  Do not shave 48 hours prior to surgery.  Do not bring valuables to the hospital.  [facility] is not   responsible for any belongings or valuables brought to the hospital.  Contacts, dentures or bridgework may not be worn into surgery.  Leave suitcase in the car. After surgery it may be brought to your room.  For patients admitted to the hospital, checkout time is 11:00 AM the day of              discharge.      Please read over the following fact sheets that you were given:     Preparing for Surgery

## 2022-07-22 ENCOUNTER — Encounter (HOSPITAL_COMMUNITY): Payer: Self-pay

## 2022-07-24 ENCOUNTER — Ambulatory Visit (INDEPENDENT_AMBULATORY_CARE_PROVIDER_SITE_OTHER): Payer: BC Managed Care – PPO | Admitting: Advanced Practice Midwife

## 2022-07-24 ENCOUNTER — Encounter: Payer: Self-pay | Admitting: Advanced Practice Midwife

## 2022-07-24 VITALS — BP 105/66 | HR 87 | Wt 173.0 lb

## 2022-07-24 DIAGNOSIS — Z3483 Encounter for supervision of other normal pregnancy, third trimester: Secondary | ICD-10-CM

## 2022-07-24 DIAGNOSIS — O09293 Supervision of pregnancy with other poor reproductive or obstetric history, third trimester: Secondary | ICD-10-CM

## 2022-07-24 DIAGNOSIS — O09299 Supervision of pregnancy with other poor reproductive or obstetric history, unspecified trimester: Secondary | ICD-10-CM

## 2022-07-24 DIAGNOSIS — Z3A38 38 weeks gestation of pregnancy: Secondary | ICD-10-CM

## 2022-07-24 NOTE — Progress Notes (Signed)
   LOW-RISK PREGNANCY VISIT Patient name: Megan Tran MRN [id number]  Date of birth: Apr 22, 1990 Chief Complaint:   No chief complaint on file.  History of Present Illness:   Megan Tran is a 32 y.o. G69P1001 female at [redacted]w[redacted]d with an Estimated Date of Delivery: 08/06/22 being seen today for ongoing management of a low-risk pregnancy.  Today she reports no complaints. Contractions: Irregular.  .  Movement: Present. denies leaking of fluid. Review of Systems:   Pertinent items are noted in HPI Denies abnormal vaginal discharge w/ itching/odor/irritation, headaches, visual changes, shortness of breath, chest pain, abdominal pain, severe nausea/vomiting, or problems with urination or bowel movements unless otherwise stated above. Pertinent History Reviewed:  Reviewed past medical,surgical, social, obstetrical and family history.  Reviewed problem list, medications and allergies. Physical Assessment:   Vitals:   07/24/22 1430  BP: 105/66  Pulse: 87  Weight: 173 lb (78.5 kg)  Body mass index is 28.79 kg/m.        Physical Examination:   General appearance: Well appearing, and in no distress  Mental status: Alert, oriented to person, place, and time  Skin: Warm & dry  Cardiovascular: Normal heart rate noted  Respiratory: Normal respiratory effort, no distress  Abdomen: Soft, gravid, nontender  Pelvic: Cervical exam deferred         Extremities: Edema: None  Fetal Status:     Movement: Present    Chaperone:  N/A    No results found for this or any previous visit (from the past 24 hour(s)).  Assessment & Plan:    Pregnancy: G2P1001 at [redacted]w[redacted]d 1. Supervision of normal intrauterine pregnancy in multigravida, third trimester   2. [redacted] weeks gestation of pregnancy   3. H/O maternal fourth degree perineal laceration, currently pregnant Primary CS 4/13     Meds: No orders of the defined types were placed in this encounter.  Labs/procedures today:   Plan:  Continue routine  obstetrical care  Next visit: prefers in person    Reviewed: Term labor symptoms and general obstetric precautions including but not limited to vaginal bleeding, contractions, leaking of fluid and fetal movement were reviewed in detail with the patient.  All questions were answered. Has home bp cuff.. Check bp weekly, let us know if >140/90.   Follow-up: Return for As scheduled.  Future Appointments  Date Time Provider [place]  07/31/2022  9:30 AM [facility]-LD PAT 1 MC-INDC None  07/31/2022  3:30 PM Christin Fudge, CNM [facility]-FT [facility]TO[facility]YN  08/06/2022  9:50 AM Myrtis Ser, CNM [facility]  08/18/2022  9:30 AM Harvel Quale, MD NRE-NRE None    No orders of the defined types were placed in this encounter.  Christin Fudge DNP, CNM 07/24/2022 2:39 PM

## 2022-07-24 NOTE — Patient Instructions (Signed)

## 2022-07-31 ENCOUNTER — Encounter: Payer: BC Managed Care – PPO | Admitting: Advanced Practice Midwife

## 2022-07-31 ENCOUNTER — Other Ambulatory Visit (HOSPITAL_COMMUNITY)
Admission: RE | Admit: 2022-07-31 | Discharge: 2022-07-31 | Disposition: A | Discharge status: Home / Self Care | Payer: BC Managed Care – PPO | Source: Ambulatory Visit | Attending: Obstetrics & Gynecology | Admitting: Obstetrics & Gynecology

## 2022-07-31 ENCOUNTER — Other Ambulatory Visit: Payer: Self-pay | Admitting: Family Medicine

## 2022-07-31 DIAGNOSIS — D62 Acute posthemorrhagic anemia: Secondary | ICD-10-CM | POA: Diagnosis not present

## 2022-07-31 DIAGNOSIS — Z3A39 39 weeks gestation of pregnancy: Secondary | ICD-10-CM | POA: Diagnosis not present

## 2022-07-31 DIAGNOSIS — Z01818 Encounter for other preprocedural examination: Secondary | ICD-10-CM

## 2022-07-31 DIAGNOSIS — O34211 Maternal care for low transverse scar from previous cesarean delivery: Secondary | ICD-10-CM | POA: Diagnosis not present

## 2022-07-31 DIAGNOSIS — O3473 Maternal care for abnormality of vulva and perineum, third trimester: Secondary | ICD-10-CM | POA: Diagnosis not present

## 2022-07-31 DIAGNOSIS — Z87891 Personal history of nicotine dependence: Secondary | ICD-10-CM | POA: Diagnosis not present

## 2022-07-31 DIAGNOSIS — Z01812 Encounter for preprocedural laboratory examination: Secondary | ICD-10-CM | POA: Insufficient documentation

## 2022-07-31 DIAGNOSIS — O9902 Anemia complicating childbirth: Secondary | ICD-10-CM | POA: Diagnosis not present

## 2022-07-31 DIAGNOSIS — Z98891 History of uterine scar from previous surgery: Secondary | ICD-10-CM

## 2022-07-31 DIAGNOSIS — O3663X0 Maternal care for excessive fetal growth, third trimester, not applicable or unspecified: Secondary | ICD-10-CM | POA: Diagnosis not present

## 2022-07-31 DIAGNOSIS — O09299 Supervision of pregnancy with other poor reproductive or obstetric history, unspecified trimester: Secondary | ICD-10-CM

## 2022-07-31 HISTORY — DX: Irritable bowel syndrome, unspecified: K58.9

## 2022-07-31 LAB — RAPID HIV SCREEN (HIV 1/2 AB+AG)
HIV 1/2 Antibodies: NONREACTIVE
HIV-1 P24 Antigen - HIV24: NONREACTIVE

## 2022-07-31 LAB — CBC
HCT: 34.9 % — ABNORMAL LOW (ref 36.0–46.0)
Hemoglobin: 11.6 g/dL — ABNORMAL LOW (ref 12.0–15.0)
MCH: 30 pg (ref 26.0–34.0)
MCHC: 33.2 g/dL (ref 30.0–36.0)
MCV: 90.2 fL (ref 80.0–100.0)
Platelets: 206 10*3/uL (ref 150–400)
RBC: 3.87 MIL/uL (ref 3.87–5.11)
RDW: 14.3 % (ref 11.5–15.5)
WBC: 9.4 10*3/uL (ref 4.0–10.5)
nRBC: 0 % (ref 0.0–0.2)

## 2022-07-31 LAB — TYPE AND SCREEN
ABO/RH(D): A POS
Antibody Screen: NEGATIVE

## 2022-08-01 LAB — RPR: RPR Ser Ql: NONREACTIVE

## 2022-08-01 NOTE — Anesthesia Preprocedure Evaluation (Signed)
Anesthesia Evaluation  Patient identified by MRN, date of birth, ID band Patient awake    Reviewed: Allergy & Precautions, NPO status , Patient's Chart, lab work & pertinent test results  History of Anesthesia Complications Negative for: history of anesthetic complications  Airway Mallampati: II  TM Distance: >3 FB Neck ROM: Full    Dental   Pulmonary neg shortness of breath, neg recent URI, former smoker   Pulmonary exam normal breath sounds clear to auscultation       Cardiovascular negative cardio ROS  Rhythm:Regular Rate:Normal     Neuro/Psych negative neurological ROS     GI/Hepatic Neg liver ROS,GERD  Medicated,,  Endo/Other  negative endocrine ROS    Renal/GU negative Renal ROS     Musculoskeletal   Abdominal   Peds  Hematology negative hematology ROS (+)   Anesthesia Other Findings G2P1001 presents for primary c-section for prior 4th deg/VAVD and LGA  Reproductive/Obstetrics (+) Pregnancy                             Anesthesia Physical Anesthesia Plan  ASA: 2  Anesthesia Plan: Spinal   Post-op Pain Management:    Induction:   PONV Risk Score and Plan: 2 and Ondansetron, Dexamethasone and Treatment may vary due to age or medical condition  Airway Management Planned: Natural Airway  Additional Equipment:   Intra-op Plan:   Post-operative Plan:   Informed Consent: I have reviewed the patients History and Physical, chart, labs and discussed the procedure including the risks, benefits and alternatives for the proposed anesthesia with the patient or authorized representative who has indicated his/her understanding and acceptance.     Dental advisory given  Plan Discussed with: CRNA and Anesthesiologist  Anesthesia Plan Comments: (I have discussed risks of neuraxial anesthesia including but not limited to infection, bleeding, nerve injury, back pain, headache,  seizures, and failure of block. Patient denies bleeding disorders and is not currently anticoagulated. Labs have been reviewed. Risks and benefits discussed. All patient's questions answered.  )        Anesthesia Quick Evaluation

## 2022-08-02 ENCOUNTER — Encounter (HOSPITAL_COMMUNITY)
Admission: RE | Disposition: A | Discharge status: Home / Self Care | Payer: Self-pay | Source: Home / Self Care | Attending: Obstetrics & Gynecology

## 2022-08-02 ENCOUNTER — Inpatient Hospital Stay (HOSPITAL_COMMUNITY): Payer: BC Managed Care – PPO | Admitting: Anesthesiology

## 2022-08-02 ENCOUNTER — Inpatient Hospital Stay (HOSPITAL_COMMUNITY)
Admission: RE | Admit: 2022-08-02 | Discharge: 2022-08-04 | DRG: 787 | Disposition: A | Discharge status: Home / Self Care | Payer: BC Managed Care – PPO | Attending: Obstetrics & Gynecology | Admitting: Obstetrics & Gynecology

## 2022-08-02 ENCOUNTER — Other Ambulatory Visit: Payer: Self-pay

## 2022-08-02 ENCOUNTER — Encounter (HOSPITAL_COMMUNITY): Payer: Self-pay | Admitting: Family Medicine

## 2022-08-02 DIAGNOSIS — Z3A39 39 weeks gestation of pregnancy: Secondary | ICD-10-CM

## 2022-08-02 DIAGNOSIS — O9902 Anemia complicating childbirth: Secondary | ICD-10-CM | POA: Diagnosis present

## 2022-08-02 DIAGNOSIS — Z87891 Personal history of nicotine dependence: Secondary | ICD-10-CM

## 2022-08-02 DIAGNOSIS — Z01818 Encounter for other preprocedural examination: Secondary | ICD-10-CM

## 2022-08-02 DIAGNOSIS — O3663X0 Maternal care for excessive fetal growth, third trimester, not applicable or unspecified: Secondary | ICD-10-CM | POA: Diagnosis present

## 2022-08-02 DIAGNOSIS — Z98891 History of uterine scar from previous surgery: Secondary | ICD-10-CM

## 2022-08-02 DIAGNOSIS — D62 Acute posthemorrhagic anemia: Secondary | ICD-10-CM | POA: Diagnosis not present

## 2022-08-02 DIAGNOSIS — O3473 Maternal care for abnormality of vulva and perineum, third trimester: Secondary | ICD-10-CM | POA: Diagnosis not present

## 2022-08-02 DIAGNOSIS — O09299 Supervision of pregnancy with other poor reproductive or obstetric history, unspecified trimester: Secondary | ICD-10-CM

## 2022-08-02 DIAGNOSIS — O34211 Maternal care for low transverse scar from previous cesarean delivery: Secondary | ICD-10-CM | POA: Diagnosis present

## 2022-08-02 SURGERY — Surgical Case
Anesthesia: Spinal

## 2022-08-02 MED ORDER — BUPIVACAINE IN DEXTROSE 0.75-8.25 % IT SOLN
INTRATHECAL | Status: DC | PRN
  Administered 2022-08-02: 1.6 mL via INTRATHECAL

## 2022-08-02 MED ORDER — STERILE WATER FOR IRRIGATION IR SOLN
Status: DC | PRN
  Administered 2022-08-02: 1

## 2022-08-02 MED ORDER — FENTANYL CITRATE (PF) 100 MCG/2ML IJ SOLN
INTRAMUSCULAR | Status: AC
  Filled 2022-08-02: qty 2

## 2022-08-02 MED ORDER — PRENATAL MULTIVITAMIN CH
1.0000 | ORAL_TABLET | Freq: Every day | ORAL | Status: DC
  Administered 2022-08-02 – 2022-08-04 (×3): 1 via ORAL
  Filled 2022-08-02 (×3): qty 1

## 2022-08-02 MED ORDER — MENTHOL 3 MG MT LOZG: 1.0000 | LOZENGE | OROMUCOSAL | Status: DC | PRN

## 2022-08-02 MED ORDER — EPHEDRINE 5 MG/ML INJ
INTRAVENOUS | Status: AC
  Filled 2022-08-02: qty 10

## 2022-08-02 MED ORDER — ACETAMINOPHEN 10 MG/ML IV SOLN
INTRAVENOUS | Status: DC | PRN
  Administered 2022-08-02: 1000 mg via INTRAVENOUS

## 2022-08-02 MED ORDER — OXYCODONE HCL 5 MG/5ML PO SOLN: 5.0000 mg | Freq: Once | ORAL | Status: DC | PRN

## 2022-08-02 MED ORDER — OXYTOCIN-SODIUM CHLORIDE 30-0.9 UT/500ML-% IV SOLN
INTRAVENOUS | Status: AC
  Filled 2022-08-02: qty 500

## 2022-08-02 MED ORDER — MORPHINE SULFATE (PF) 0.5 MG/ML IJ SOLN
INTRAMUSCULAR | Status: DC | PRN
  Administered 2022-08-02: 150 ug via INTRATHECAL

## 2022-08-02 MED ORDER — PHENYLEPHRINE HCL-NACL 20-0.9 MG/250ML-% IV SOLN
INTRAVENOUS | Status: DC | PRN
  Administered 2022-08-02: 60 ug/min via INTRAVENOUS

## 2022-08-02 MED ORDER — ACETAMINOPHEN 500 MG PO TABS
1000.0000 mg | ORAL_TABLET | Freq: Four times a day (QID) | ORAL | Status: DC
  Administered 2022-08-02 – 2022-08-04 (×8): 1000 mg via ORAL
  Filled 2022-08-02 (×8): qty 2

## 2022-08-02 MED ORDER — IBUPROFEN 600 MG PO TABS: 600.0000 mg | ORAL_TABLET | Freq: Four times a day (QID) | ORAL | Status: DC

## 2022-08-02 MED ORDER — FENTANYL CITRATE (PF) 100 MCG/2ML IJ SOLN: 25.0000 ug | INTRAMUSCULAR | Status: DC | PRN

## 2022-08-02 MED ORDER — GENTAMICIN SULFATE 40 MG/ML IJ SOLN
5.0000 mg/kg | INTRAVENOUS | Status: AC
  Administered 2022-08-02: 390 mg via INTRAVENOUS
  Filled 2022-08-02: qty 9.75

## 2022-08-02 MED ORDER — PHENYLEPHRINE 80 MCG/ML (10ML) SYRINGE FOR IV PUSH (FOR BLOOD PRESSURE SUPPORT)
PREFILLED_SYRINGE | INTRAVENOUS | Status: AC
  Filled 2022-08-02: qty 10

## 2022-08-02 MED ORDER — PROPOFOL 10 MG/ML IV BOLUS
INTRAVENOUS | Status: AC
  Filled 2022-08-02: qty 20

## 2022-08-02 MED ORDER — SOD CITRATE-CITRIC ACID 500-334 MG/5ML PO SOLN
ORAL | Status: AC
  Filled 2022-08-02: qty 30

## 2022-08-02 MED ORDER — SIMETHICONE 80 MG PO CHEW
80.0000 mg | CHEWABLE_TABLET | ORAL | Status: DC | PRN
  Administered 2022-08-03 – 2022-08-04 (×2): 80 mg via ORAL
  Filled 2022-08-02 (×2): qty 1

## 2022-08-02 MED ORDER — ZOLPIDEM TARTRATE 5 MG PO TABS: 5.0000 mg | ORAL_TABLET | Freq: Every evening | ORAL | Status: DC | PRN

## 2022-08-02 MED ORDER — PHENYLEPHRINE 80 MCG/ML (10ML) SYRINGE FOR IV PUSH (FOR BLOOD PRESSURE SUPPORT)
PREFILLED_SYRINGE | INTRAVENOUS | Status: DC | PRN
  Administered 2022-08-02 (×2): 80 ug via INTRAVENOUS

## 2022-08-02 MED ORDER — OXYTOCIN-SODIUM CHLORIDE 30-0.9 UT/500ML-% IV SOLN
2.5000 [IU]/h | INTRAVENOUS | Status: AC
  Administered 2022-08-02: 2.5 [IU]/h via INTRAVENOUS
  Filled 2022-08-02: qty 500

## 2022-08-02 MED ORDER — COCONUT OIL OIL
1.0000 | TOPICAL_OIL | Status: DC | PRN
  Administered 2022-08-03: 1 via TOPICAL

## 2022-08-02 MED ORDER — ACETAMINOPHEN 10 MG/ML IV SOLN: 1000.0000 mg | Freq: Once | INTRAVENOUS | Status: DC | PRN

## 2022-08-02 MED ORDER — CLINDAMYCIN PHOSPHATE 900 MG/50ML IV SOLN
900.0000 mg | INTRAVENOUS | Status: AC
  Administered 2022-08-02: 900 mg via INTRAVENOUS

## 2022-08-02 MED ORDER — WITCH HAZEL-GLYCERIN EX PADS: 1.0000 | MEDICATED_PAD | CUTANEOUS | Status: DC | PRN

## 2022-08-02 MED ORDER — SOD CITRATE-CITRIC ACID 500-334 MG/5ML PO SOLN
30.0000 mL | ORAL | Status: AC
  Administered 2022-08-02: 30 mL via ORAL

## 2022-08-02 MED ORDER — OXYTOCIN-SODIUM CHLORIDE 30-0.9 UT/500ML-% IV SOLN
INTRAVENOUS | Status: DC | PRN
  Administered 2022-08-02: 30 [IU] via INTRAVENOUS

## 2022-08-02 MED ORDER — MORPHINE SULFATE (PF) 0.5 MG/ML IJ SOLN
INTRAMUSCULAR | Status: AC
  Filled 2022-08-02: qty 10

## 2022-08-02 MED ORDER — OXYCODONE HCL 5 MG PO TABS: 5.0000 mg | ORAL_TABLET | Freq: Once | ORAL | Status: DC | PRN

## 2022-08-02 MED ORDER — OXYCODONE HCL 5 MG PO TABS
5.0000 mg | ORAL_TABLET | ORAL | Status: DC | PRN
  Administered 2022-08-03 (×2): 5 mg via ORAL
  Filled 2022-08-02 (×2): qty 1

## 2022-08-02 MED ORDER — BUPIVACAINE LIPOSOME 1.3 % IJ SUSP
INTRAMUSCULAR | Status: DC | PRN
  Administered 2022-08-02: 20 mL

## 2022-08-02 MED ORDER — PROMETHAZINE HCL 25 MG/ML IJ SOLN: 6.2500 mg | INTRAMUSCULAR | Status: DC | PRN

## 2022-08-02 MED ORDER — SODIUM CHLORIDE 0.9 % IR SOLN
Status: DC | PRN
  Administered 2022-08-02: 1

## 2022-08-02 MED ORDER — ONDANSETRON HCL 4 MG/2ML IJ SOLN
INTRAMUSCULAR | Status: AC
  Filled 2022-08-02: qty 4

## 2022-08-02 MED ORDER — DIPHENHYDRAMINE HCL 25 MG PO CAPS: 25.0000 mg | ORAL_CAPSULE | Freq: Four times a day (QID) | ORAL | Status: DC | PRN

## 2022-08-02 MED ORDER — ENOXAPARIN SODIUM 40 MG/0.4ML IJ SOSY
40.0000 mg | PREFILLED_SYRINGE | INTRAMUSCULAR | Status: DC
  Administered 2022-08-03 – 2022-08-04 (×2): 40 mg via SUBCUTANEOUS
  Filled 2022-08-02 (×2): qty 0.4

## 2022-08-02 MED ORDER — ONDANSETRON HCL 4 MG/2ML IJ SOLN
INTRAMUSCULAR | Status: DC | PRN
  Administered 2022-08-02: 4 mg via INTRAVENOUS

## 2022-08-02 MED ORDER — ACETAMINOPHEN 10 MG/ML IV SOLN
INTRAVENOUS | Status: AC
  Filled 2022-08-02: qty 100

## 2022-08-02 MED ORDER — KETOROLAC TROMETHAMINE 30 MG/ML IJ SOLN
30.0000 mg | Freq: Four times a day (QID) | INTRAMUSCULAR | Status: DC
  Administered 2022-08-02 (×2): 30 mg via INTRAVENOUS
  Filled 2022-08-02 (×3): qty 1

## 2022-08-02 MED ORDER — CLINDAMYCIN PHOSPHATE 900 MG/50ML IV SOLN
INTRAVENOUS | Status: AC
  Filled 2022-08-02: qty 50

## 2022-08-02 MED ORDER — LIDOCAINE-EPINEPHRINE (PF) 2 %-1:200000 IJ SOLN
INTRAMUSCULAR | Status: AC
  Filled 2022-08-02: qty 20

## 2022-08-02 MED ORDER — PANTOPRAZOLE SODIUM 40 MG PO TBEC
40.0000 mg | DELAYED_RELEASE_TABLET | Freq: Every day | ORAL | Status: DC
  Administered 2022-08-02 – 2022-08-04 (×3): 40 mg via ORAL
  Filled 2022-08-02 (×3): qty 1

## 2022-08-02 MED ORDER — SENNOSIDES-DOCUSATE SODIUM 8.6-50 MG PO TABS
2.0000 | ORAL_TABLET | Freq: Every day | ORAL | Status: DC
  Administered 2022-08-03 – 2022-08-04 (×2): 2 via ORAL
  Filled 2022-08-02 (×2): qty 2

## 2022-08-02 MED ORDER — GABAPENTIN 100 MG PO CAPS
200.0000 mg | ORAL_CAPSULE | Freq: Every day | ORAL | Status: DC
  Administered 2022-08-02 – 2022-08-03 (×2): 200 mg via ORAL
  Filled 2022-08-02 (×2): qty 2

## 2022-08-02 MED ORDER — KETOROLAC TROMETHAMINE 30 MG/ML IJ SOLN
30.0000 mg | Freq: Once | INTRAMUSCULAR | Status: AC | PRN
  Administered 2022-08-02: 30 mg via INTRAVENOUS

## 2022-08-02 MED ORDER — BUPIVACAINE LIPOSOME 1.3 % IJ SUSP
INTRAMUSCULAR | Status: AC
  Filled 2022-08-02: qty 20

## 2022-08-02 MED ORDER — FENTANYL CITRATE (PF) 100 MCG/2ML IJ SOLN
INTRAMUSCULAR | Status: DC | PRN
  Administered 2022-08-02: 15 ug via INTRATHECAL

## 2022-08-02 MED ORDER — PHENYLEPHRINE HCL-NACL 20-0.9 MG/250ML-% IV SOLN
INTRAVENOUS | Status: AC
  Filled 2022-08-02: qty 250

## 2022-08-02 MED ORDER — LACTATED RINGERS IV SOLN: INTRAVENOUS | Status: DC

## 2022-08-02 MED ORDER — SODIUM CHLORIDE 0.9 % IV SOLN
500.0000 mg | INTRAVENOUS | Status: AC
  Administered 2022-08-02: 500 mg via INTRAVENOUS
  Filled 2022-08-02: qty 5

## 2022-08-02 MED ORDER — DIBUCAINE (PERIANAL) 1 % EX OINT: 1.0000 | TOPICAL_OINTMENT | CUTANEOUS | Status: DC | PRN

## 2022-08-02 MED ORDER — SIMETHICONE 80 MG PO CHEW
80.0000 mg | CHEWABLE_TABLET | Freq: Three times a day (TID) | ORAL | Status: DC
  Administered 2022-08-02 – 2022-08-04 (×6): 80 mg via ORAL
  Filled 2022-08-02 (×6): qty 1

## 2022-08-02 SURGICAL SUPPLY — 39 items
ADH SKN CLS APL DERMABOND .7 (GAUZE/BANDAGES/DRESSINGS) ×2
APL PRP STRL LF DISP 70% ISPRP (MISCELLANEOUS) ×2
CHLORAPREP W/TINT 26 (MISCELLANEOUS) ×2 IMPLANT
CLAMP UMBILICAL CORD (MISCELLANEOUS) ×1 IMPLANT
CLOTH BEACON ORANGE TIMEOUT ST (SAFETY) ×1 IMPLANT
DERMABOND ADVANCED .7 DNX12 (GAUZE/BANDAGES/DRESSINGS) ×2 IMPLANT
DRSG OPSITE POSTOP 4X10 (GAUZE/BANDAGES/DRESSINGS) ×1 IMPLANT
ELECT REM PT RETURN 9FT ADLT (ELECTROSURGICAL) ×1
ELECTRODE REM PT RTRN 9FT ADLT (ELECTROSURGICAL) ×1 IMPLANT
EXTRACTOR VACUUM BELL STYLE (SUCTIONS) IMPLANT
GAUZE SPONGE 4X4 12PLY STRL (GAUZE/BANDAGES/DRESSINGS) IMPLANT
GAUZE SPONGE 4X4 12PLY STRL LF (GAUZE/BANDAGES/DRESSINGS) IMPLANT
GLOVE BIOGEL PI IND STRL 7.0 (GLOVE) ×1 IMPLANT
GLOVE BIOGEL PI IND STRL 8 (GLOVE) ×1 IMPLANT
GLOVE ECLIPSE 8.0 STRL XLNG CF (GLOVE) ×1 IMPLANT
GOWN STRL REUS W/TWL LRG LVL3 (GOWN DISPOSABLE) ×2 IMPLANT
KIT ABG SYR 3ML LUER SLIP (SYRINGE) ×1 IMPLANT
NDL HYPO 18GX1.5 BLUNT FILL (NEEDLE) ×1 IMPLANT
NDL HYPO 25X5/8 SAFETYGLIDE (NEEDLE) ×1 IMPLANT
NEEDLE HYPO 18GX1.5 BLUNT FILL (NEEDLE) ×1 IMPLANT
NEEDLE HYPO 22GX1.5 SAFETY (NEEDLE) ×1 IMPLANT
NEEDLE HYPO 25X5/8 SAFETYGLIDE (NEEDLE) ×1 IMPLANT
NS IRRIG 1000ML POUR BTL (IV SOLUTION) ×1 IMPLANT
PACK C SECTION WH (CUSTOM PROCEDURE TRAY) ×1 IMPLANT
PAD ABD 8X10 STRL (GAUZE/BANDAGES/DRESSINGS) IMPLANT
PAD OB MATERNITY 4.3X12.25 (PERSONAL CARE ITEMS) ×1 IMPLANT
RTRCTR C-SECT PINK 25CM LRG (MISCELLANEOUS) IMPLANT
SUT CHROMIC 0 CT 1 (SUTURE) ×1 IMPLANT
SUT MNCRL 0 VIOLET CTX 36 (SUTURE) ×2 IMPLANT
SUT MONOCRYL 0 CTX 36 (SUTURE) ×2
SUT PLAIN 2 0 (SUTURE)
SUT PLAIN 2 0 XLH (SUTURE) IMPLANT
SUT PLAIN ABS 2-0 CT1 27XMFL (SUTURE) IMPLANT
SUT VIC AB 0 CTX 36 (SUTURE) ×1
SUT VIC AB 0 CTX36XBRD ANBCTRL (SUTURE) ×1 IMPLANT
SUT VIC AB 4-0 KS 27 (SUTURE) IMPLANT
TOWEL OR 17X24 6PK STRL BLUE (TOWEL DISPOSABLE) ×1 IMPLANT
TRAY FOLEY W/BAG SLVR 14FR LF (SET/KITS/TRAYS/PACK) IMPLANT
WATER STERILE IRR 1000ML POUR (IV SOLUTION) ×1 IMPLANT

## 2022-08-02 NOTE — Addendum Note (Signed)
Addendum  created 08/02/22 1115 by Sheppard Evens, CRNA   Intraprocedure Event deleted, Intraprocedure Event edited

## 2022-08-02 NOTE — Op Note (Signed)
Cesarean Section Procedure Note   Megan Tran  08/02/2022  Indications: macrosomia and History of 4th degree perineal laceration  Pre-operative Diagnosis: SUSPECTED FETAL MACROSOMIA, HISTORY OF 4TH DEGREE LACERATION.   Post-operative Diagnosis: Same   Surgeon: Surgeon(s) and Role:    * Eure, Amaryllis Dyke, MD - Primary    * Jamarien Rodkey, Nadene Rubins, MD - Assisting   Attending Attestation: I was present and scrubbed for the entire procedure.   Assistants: Sheppard Evens MD  An experienced assistant was required given the standard of surgical care given the complexity of the case.  This assistant was needed for exposure, dissection, suctioning, retraction, instrument exchange, assisting with delivery with administration of fundal pressure, and for overall help during the procedure.  Anesthesia: spinal    Estimated Blood Loss: 523 mL   Total IV Fluids:   Urine Output:  300CC OF clear urine  Specimens: Placenta to L&D  Findings: viable female newborn, normal placenta, fallopian tubes and ovaries bilaterally  Baby condition / location:  Couplet care / Skin to Skin 4300 gm APGAR: 9, 9;.     Complications: no complications  Indications: Megan Tran is a 32 y.o. G2P1001 with an IUP [redacted]w[redacted]d presenting for scheduled primary c-section.  The risks, benefits, complications, treatment options, and expected outcomes were discussed with the patient . The patient concurred with the proposed plan, giving informed consent. identified as [name] and the procedure verified as C-Section Delivery.  Procedure Details: A Time Out was held and the above information confirmed.  The patient was taken back to the operative suite where spinal anesthesia was placed.  After induction of anesthesia, the patient was draped and prepped in the usual sterile manner and placed in a dorsal supine position with a leftward tilt. A transverse was made and carried down through the subcutaneous  tissue to the fascia. Fascial incision was made and extended transversely. The fascia was separated from the underlying rectus tissue superiorly and inferiorly. The peritoneum was identified and entered. Peritoneal incision was extended longitudinally. A low transverse uterine incision was made. Delivered from cephalic presentation was a 4300 gram Living newborn infant(s) or Female with Apgar scores of 9 at one minute and 9 at five minutes. Cord ph was not sent the umbilical cord was clamped and cut cord blood was obtained for evaluation. The placenta was removed Intact and appeared normal. The uterine outline, tubes and ovaries appeared normal}. The uterine incision was closed with running locked sutures of 0 Monocryl.  Hemostasis was observed. Lavage was carried out until clear. The peritoneum was re-approximated using 2-0 chromic. The fascia was then reapproximated with running sutures of 0 Vicryl. The subcuticular closure was performed using 2-0plain gut. The skin was closed with 4-0Vicryl.   Instrument, sponge, and needle counts were correct prior the abdominal closure and were correct at the conclusion of the case.     Disposition: PACU - hemodynamically stable.   Maternal Condition: stable    Signed: Sheppard Evens MD MPH OB Fellow, Faculty Practice [facility], Center for [facility] 08/02/2022

## 2022-08-02 NOTE — Anesthesia Procedure Notes (Signed)
Spinal  Patient location during procedure: OR Start time: 08/02/2022 7:47 AM End time: 08/02/2022 7:49 AM Reason for block: surgical anesthesia Staffing Performed: anesthesiologist  Anesthesiologist: Linton Rump, MD Performed by: Linton Rump, MD Authorized by: Linton Rump, MD   Preanesthetic Checklist Completed: patient identified, IV checked, site marked, risks and benefits discussed, surgical consent, monitors and equipment checked, pre-op evaluation and timeout performed Spinal Block Patient position: sitting Prep: DuraPrep Patient monitoring: blood pressure and continuous pulse ox Approach: midline Location: L3-4 Injection technique: single-shot Needle Needle type: Pencan  Needle gauge: 24 G Needle length: 9 cm Assessment Sensory level: T4 Additional Notes Risks and benefits of neuraxial anesthesia including, but not limited to, infection, bleeding, local anesthetic toxicity, headache, hypotension, back pain, block failure, etc. were discussed with the patient. The patient expressed understanding and consented to the procedure. I confirmed that the patient has no bleeding disorders and is not taking blood thinners. I confirmed the patient's last platelet count with the nurse. Monitors were applied. A time-out was performed immediately prior to the procedure. Sterile technique was used throughout the whole procedure.   1 attempt(s)

## 2022-08-02 NOTE — Transfer of Care (Signed)
Immediate Anesthesia Transfer of Care Note  Patient: Megan Tran  Procedure(s) Performed: CESAREAN SECTION  Patient Location: PACU  Anesthesia Type:Spinal  Level of Consciousness: awake, alert , and oriented  Airway & Oxygen Therapy: Patient Spontanous Breathing  Post-op Assessment: Report given to RN and Post -op Vital signs reviewed and stable  Post vital signs: Reviewed and stable  Last Vitals:  Vitals Value Taken Time  BP 110/61 08/02/22 0908  Temp 36.3 C 08/02/22 0908  Pulse 84 08/02/22 0908  Resp 12 08/02/22 0908  SpO2      Last Pain:  Vitals:   08/02/22 0908  TempSrc: Oral         Complications: No notable events documented.

## 2022-08-02 NOTE — H&P (Addendum)
Attestation of Attending Supervision of Advanced Practitioner (CNM/NP/PA): Evaluation and management procedures were performed by the Advanced Practitioner under my supervision and collaboration.  I have reviewed the Advanced Practitioner's note and chart, and I agree with the management and plan.  Rockne Coons MD Attending Physician for the Center for [facility] 08/02/2022 7:37 AM   LABOR AND DELIVERY ADMISSION HISTORY AND PHYSICAL NOTE  Megan Tran is a 32 y.o. female G2P1001 with IUP at [redacted]w[redacted]d presenting for scheduled primary LTCS.   Patient reports the fetal movement as active. Patient reports uterine contraction  activity as none. Patient reports  vaginal bleeding as none. Patient describes fluid per vagina as None.   Patient denies headache, vision changes, chest pain, shortness of breath, right upper quadrant pain, or LE edema.  She plans on breast feeding feeding. Her contraception plan is: no method.  Prenatal History/Complications: PNC at [facility]  Sono:  , CWD, normal anatomy, cephalic presentation, posterior placenta, 94%ile, EFW 3424  Pregnancy complications:  Patient Active Problem List   Diagnosis Date Noted   S/P primary low transverse C-section 08/02/2022   H/O maternal fourth degree perineal laceration, currently pregnant 06/19/2022   Condyloma acuminata of vulva during pregnancy in second trimester 03/31/2022   Encounter for supervision of normal pregnancy, antepartum 01/24/2022   Abnormal Pap smear of cervix 05/16/2021   Gastroesophageal reflux disease without esophagitis 12/20/2020   Bloating 12/20/2020   Constipation 12/20/2020    Past Medical History: Past Medical History:  Diagnosis Date   Condyloma acuminata of vulva during pregnancy in second trimester    introitus   Contraceptive management 03/03/2014   GERD (gastroesophageal reflux disease)    IBS (irritable bowel syndrome)    LGSIL on Pap smear of cervix 05/16/2021   05/16/21  repeat pap in 1 year per ASCCP guidelines 5 year risk of CIN3+ 3.78   Medical history non-contributory    Pregnant 10/19/2015   Vaginal burning 11/16/2015   Vaginal discharge during pregnancy in first trimester 11/16/2015   Yeast infection 11/02/2012    Past Surgical History: Past Surgical History:  Procedure Laterality Date   BIOPSY  02/01/2021   Procedure: BIOPSY;  Surgeon: Marguerita Merles, Reuel Boom, MD;  Location: [facility];  Service: Gastroenterology;;  small, bowel biopsies   ESOPHAGOGASTRODUODENOSCOPY (EGD) WITH PROPOFOL N/A 02/01/2021   Procedure: ESOPHAGOGASTRODUODENOSCOPY (EGD) WITH PROPOFOL;  Surgeon: Dolores Frame, MD;  Location: [facility];  Service: Gastroenterology;  Laterality: N/A;  8:45   MOUTH SURGERY  Oct. 2012    Obstetrical History: OB History     Gravida  2   Para  1   Term  1   Preterm  0   AB  0   Living  1      SAB  0   IAB  0   Ectopic  0   Multiple  0   Live Births  1           Social History: Social History   Socioeconomic History   Marital status: Single    Spouse name: Not on file   Number of children: Not on file   Years of education: Not on file   Highest education level: Not on file  Occupational History   Not on file  Tobacco Use   Smoking status: Former    Types: Cigarettes    Quit date: 09/24/2015    Years since quitting: 6.8    Passive exposure: Past   Smokeless tobacco: Former  Vaping Use   Vaping Use: Never used  Substance and Sexual Activity   Alcohol use: Not Currently    Comment: occassional; not now   Drug use: No   Sexual activity: Yes  Other Topics Concern   Not on file  Social History Narrative   Not on file   Social Determinants of Health   Financial Resource Strain: Low Risk  (01/27/2022)   Overall Financial Resource Strain (CARDIA)    Difficulty of Paying Living Expenses: Not very hard  Food Insecurity: No Food Insecurity (01/27/2022)   Hunger Vital Sign    Worried  About Running Out of Food in the Last Year: Never true    Ran Out of Food in the Last Year: Never true  Transportation Needs: No Transportation Needs (01/27/2022)   PRAPARE - Administrator, Civil Service (Medical): No    Lack of Transportation (Non-Medical): No  Physical Activity: Sufficiently Active (01/27/2022)   Exercise Vital Sign    Days of Exercise per Week: 5 days    Minutes of Exercise per Session: 100 min  Stress: No Stress Concern Present (01/27/2022)   [employer] of Occupational Health - Occupational Stress Questionnaire    Feeling of Stress : Only a little  Social Connections: Moderately Integrated (01/27/2022)   Social Connection and Isolation Panel [NHANES]    Frequency of Communication with Friends and Family: More than three times a week    Frequency of Social Gatherings with Friends and Family: More than three times a week    Attends Religious Services: 1 to 4 times per year    Active Member of [employer] or Organizations: No    Attends Banker Meetings: Never    Marital Status: Living with partner    Family History: Family History  Problem Relation Age of Onset   Hypertension Maternal Grandmother    Hypertension Maternal Grandfather    Heart disease Maternal Grandfather     Allergies: Allergies  Allergen Reactions   Cefdinir Hives    Whole body rash   Dicyclomine Rash    Medications Prior to Admission  Medication Sig Dispense Refill Last Dose   acetaminophen (TYLENOL) 500 MG tablet Take 500-1,000 mg by mouth every 6 (six) hours as needed (headache).      Calcium Carbonate-Simethicone (TUMS GAS RELIEF CHEWY BITES) 750-80 MG CHEW Chew 2 tablets by mouth daily as needed (indigestion/gas).   08/01/2022   docusate sodium (COLACE) 100 MG capsule Take 100-200 mg by mouth daily as needed (constipation).      montelukast (SINGULAIR) 4 MG chewable tablet Chew 1 tablet (4 mg total) by mouth at bedtime. (Patient taking differently: Chew 4 mg by  mouth daily as needed (allergies.).) 90 tablet 2 08/01/2022   pantoprazole (PROTONIX) 40 MG tablet Take 1 tablet by mouth twice daily 60 tablet 5 08/01/2022   Prenatal Vit-Fe Fumarate-FA (PRENATAL VITAMIN PO) Take 1 tablet by mouth at bedtime.   08/01/2022   simethicone (GAS-X EXTRA STRENGTH) 125 MG chewable tablet Chew 250 mg by mouth every 6 (six) hours as needed for flatulence.   08/01/2022     Review of Systems  All systems reviewed and negative except as stated in HPI  Physical Exam BP 121/79   Pulse 86   Temp 98 F (36.7 C) (Oral)   Resp 16   Ht  (1.651 m)   Wt 78.7 kg   LMP 10/30/2021   SpO2 98%   BMI 28.86 kg/m   Physical Exam  Constitutional:      General: She is not in acute distress.    Appearance: Normal appearance. She is not toxic-appearing.  HENT:     Head: Normocephalic and atraumatic.     Nose: Nose normal.     Mouth/Throat:     Mouth: Mucous membranes are moist.  Eyes:     Extraocular Movements: Extraocular movements intact.     Conjunctiva/sclera: Conjunctivae normal.  Cardiovascular:     Rate and Rhythm: Normal rate.     Pulses: Normal pulses.  Pulmonary:     Effort: Pulmonary effort is normal. No respiratory distress.  Abdominal:     Tenderness: There is no abdominal tenderness.     Comments: Gravid  Skin:    General: Skin is warm and dry.     Capillary Refill: Capillary refill takes less than 2 seconds.  Neurological:     General: No focal deficit present.     Mental Status: She is alert.  Psychiatric:        Mood and Affect: Mood normal.        Behavior: Behavior normal.     FHR: 122  Prenatal labs: ABO, Rh: --/--/A POS (04/11 0929) Antibody: NEG (04/11 0929) Rubella: 1.28 (10/09 1114) RPR: NON REACTIVE (04/11 0922)  HBsAg: Negative (10/09 1114)  HIV: NON REACTIVE (04/11 0922)  GC/Chlamydia:  Neisseria Gonorrhea  Date Value Ref Range Status  07/10/2022 Negative  Final   Chlamydia  Date Value Ref Range Status  07/10/2022  Negative  Final   GBS: Negative/-- (03/21 1200)  Prenatal Transfer Tool  Maternal Diabetes: No Genetic Screening: Normal Maternal Ultrasounds/Referrals: Normal, LGA baby Fetal Ultrasounds or other Referrals:  None Maternal Substance Abuse:  No Significant Maternal Medications:  None Significant Maternal Lab Results: Group B Strep negative  No results found for this or any previous visit (from the past 24 hour(s)).  Assessment: Megan Tran is a 32 y.o. G2P1001 at [redacted]w[redacted]d here for scheduled cesarean section. Fro previous 4th degree with EFW 4000 grams  #Primary Low Transverse Cesarean  The risks of cesarean section discussed with the patient included but were not limited to: bleeding which may require transfusion or reoperation; infection which may require antibiotics; injury to bowel, bladder, ureters or other surrounding organs; injury to the fetus; need for additional procedures including hysterectomy in the event of a life-threatening hemorrhage; placental abnormalities with subsequent pregnancies, incisional problems, thromboembolic phenomenon and other postoperative/anesthesia complications. The patient concurred with the proposed plan, giving informed written consent for the procedure. Patient has been NPO since last night she will remain NPO for procedure. Anesthesia and OR aware. Preoperative prophylactic antibiotics and SCDs ordered on call to the OR.   #Anesthesia: spinal #FWB: FHR 122 #GBS/ID: Negative #MOF: breast feeding #MOC: no method #Circ: No    Sheppard Evens MD MPH OB Fellow, Faculty Practice [facility], Center for [facility]  08/02/2022, 6:58 AM

## 2022-08-02 NOTE — Anesthesia Postprocedure Evaluation (Signed)
Anesthesia Post Note  Patient: [name]  Procedure(s) Performed: CESAREAN SECTION     Patient location during evaluation: PACU Anesthesia Type: Spinal Level of consciousness: awake Pain management: pain level controlled Vital Signs Assessment: post-procedure vital signs reviewed and stable Respiratory status: spontaneous breathing, respiratory function stable and nonlabored ventilation Cardiovascular status: blood pressure returned to baseline and stable Postop Assessment: no headache, no backache and no apparent nausea or vomiting Anesthetic complications: no   No notable events documented.  Last Vitals:  Vitals:   08/02/22 0945 08/02/22 1000  BP: 106/72 109/72  Pulse: 90 81  Resp: 17 17  Temp:    SpO2: 99% 98%    Last Pain:  Vitals:   08/02/22 1004  TempSrc:   PainSc: 0-No pain   Pain Goal:    LLE Motor Response: Purposeful movement (08/02/22 1000) LLE Sensation: Decreased (08/02/22 1000) RLE Motor Response: Purposeful movement (08/02/22 1000) RLE Sensation: Decreased (08/02/22 1000)        Linton Rump

## 2022-08-02 NOTE — Discharge Summary (Signed)
Postpartum Discharge Summary  Date of Service updated***     Patient Name: Megan Tran DOB: 10/03/1990 MRN: [id number]  Date of admission: 08/02/2022 Delivery date:08/02/2022  Delivering provider: Lazaro Arms  Date of discharge: 08/02/2022  Admitting diagnosis: S/P primary low transverse C-section [Z98.891] Intrauterine pregnancy: [redacted]w[redacted]d     Secondary diagnosis:  Principal Problem:   S/P primary low transverse C-section Active Problems:   H/O maternal fourth degree perineal laceration, currently pregnant  Additional problems: ***    Discharge diagnosis: {DX.:23714}                                              Post partum procedures:{Postpartum procedures:23558} Augmentation: N/A Complications: None  Hospital course: Sceduled C/S   32 y.o. yo G2P1001 at [redacted]w[redacted]d was admitted to the hospital 08/02/2022 for scheduled cesarean section with the following indication:Elective Primary due to fetal macrosomia and history of 4th deg laceration. Delivery details are as follows:  Membrane Rupture Time/Date: 8:14 AM ,08/02/2022   Delivery Method:C-Section, Low Transverse  Details of operation can be found in separate operative note.  Patient had a postpartum course complicated by***.  She is ambulating, tolerating a regular diet, passing flatus, and urinating well. Patient is discharged home in stable condition on  08/02/22        Newborn Data: Birth date:08/02/2022  Birth time:8:14 AM  Gender:Female  Living status:Living  Apgars: ,  Weight:4300 g     Magnesium Sulfate received: No BMZ received: No Rhophylac:N/A MMR:N/A T-DaP:Given prenatally Flu: N/A Transfusion:{Transfusion received:30440034}  Physical exam  Vitals:   08/02/22 0536 08/02/22 0558  BP:  121/79  Pulse:  86  Resp:  16  Temp:  98 F (36.7 C)  TempSrc:  Oral  SpO2:  98%  Weight: 78.7 kg   Height:  (1.651 m)    General: {Exam; general:21111117} Lochia: {Desc;  appropriate/inappropriate:30686::"appropriate"} Uterine Fundus: {Desc; firm/soft:30687} Incision: {Exam; incision:21111123} DVT Evaluation: {Exam; NWG:9562130} Labs: Lab Results  Component Value Date   WBC 9.4 07/31/2022   HGB 11.6 (L) 07/31/2022   HCT 34.9 (L) 07/31/2022   MCV 90.2 07/31/2022   PLT 206 07/31/2022      Latest Ref Rng & Units 06/15/2018    8:48 PM  CMP  Glucose 70 - 99 mg/dL 85   BUN 6 - 20 mg/dL 10   Creatinine 8.65 - 1.00 mg/dL 7.84   Sodium 696 - 295 mmol/L 137   Potassium 3.5 - 5.1 mmol/L 3.7   Chloride 98 - 111 mmol/L 103   CO2 22 - 32 mmol/L 25   Calcium 8.9 - 10.3 mg/dL 9.4    Edinburgh Score:     No data to display           After visit meds:  Allergies as of 08/02/2022       Reactions   Cefdinir Hives   Whole body rash   Dicyclomine Rash     Med Rec must be completed prior to using this [facility]***        Discharge home in stable condition Infant Feeding: {Baby feeding:23562} Infant Disposition:{CHL IP OB HOME WITH MWUXLK:44010} Discharge instruction: per After Visit Summary and Postpartum booklet. Activity: Advance as tolerated. Pelvic rest for 6 weeks.  Diet: {OB UVOZ:36644034} Future Appointments: Future Appointments  Date Time Provider Department Center  08/18/2022  9:30 AM Marguerita Merles,  Reuel Boom, MD [facility] None   Follow up Visit:  The following message was sent to [facility] by Gwenyth Allegra, MD  Please schedule this patient for a In person postpartum visit in 6 weeks with the following provider: Any provider. Additional Postpartum F/U:Incision check 1 week  High risk pregnancy complicated by: LGA and history of 4th degree perineal laceration Delivery mode:  C-Section, Low Transverse  Anticipated Birth Control:   None   08/02/2022 Payam Gribble Lizabeth Leyden, MD

## 2022-08-03 ENCOUNTER — Encounter (HOSPITAL_COMMUNITY): Payer: Self-pay | Admitting: Obstetrics & Gynecology

## 2022-08-03 LAB — CBC
HCT: 29.1 % — ABNORMAL LOW (ref 36.0–46.0)
Hemoglobin: 9.7 g/dL — ABNORMAL LOW (ref 12.0–15.0)
MCH: 29.5 pg (ref 26.0–34.0)
MCHC: 33.3 g/dL (ref 30.0–36.0)
MCV: 88.4 fL (ref 80.0–100.0)
Platelets: 177 10*3/uL (ref 150–400)
RBC: 3.29 MIL/uL — ABNORMAL LOW (ref 3.87–5.11)
RDW: 14.1 % (ref 11.5–15.5)
WBC: 10.1 10*3/uL (ref 4.0–10.5)
nRBC: 0 % (ref 0.0–0.2)

## 2022-08-03 MED ORDER — GELATIN ABSORBABLE 12-7 MM EX MISC: 1.0000 | Freq: Once | CUTANEOUS | Status: DC | PRN

## 2022-08-03 MED ORDER — SUCROSE 24% NICU/PEDS ORAL SOLUTION: 0.5000 mL | OROMUCOSAL | Status: DC | PRN

## 2022-08-03 MED ORDER — FERROUS SULFATE 325 (65 FE) MG PO TABS
325.0000 mg | ORAL_TABLET | ORAL | Status: DC
  Administered 2022-08-03: 325 mg via ORAL
  Filled 2022-08-03: qty 1

## 2022-08-03 MED ORDER — WHITE PETROLATUM EX OINT
1.0000 | TOPICAL_OINTMENT | CUTANEOUS | Status: DC | PRN
Start: 2022-08-03 — End: 2022-08-03

## 2022-08-03 MED ORDER — IBUPROFEN 600 MG PO TABS
600.0000 mg | ORAL_TABLET | Freq: Four times a day (QID) | ORAL | Status: DC | PRN
  Administered 2022-08-03 – 2022-08-04 (×5): 600 mg via ORAL
  Filled 2022-08-03 (×5): qty 1

## 2022-08-03 MED ORDER — LIDOCAINE 1% INJECTION FOR CIRCUMCISION: 0.8000 mL | INJECTION | Freq: Once | INTRAVENOUS | Status: DC

## 2022-08-03 MED ORDER — EPINEPHRINE TOPICAL FOR CIRCUMCISION 0.1 MG/ML: 1.0000 [drp] | TOPICAL | Status: DC | PRN

## 2022-08-03 NOTE — Lactation Note (Signed)
This note was copied from a baby's chart. Lactation Consultation Note  Patient Name: Megan Tran [id number] Date: 08/03/2022 Age:30 hours Reason for consult: Initial assessment;Term Mom had baby on the breast but stated it pinches. LC attempted to flange lips more. Baby's top lip difficult to flange, it is tight. After you flange it after a few suckles it goes back down. Mom stated it is pinching just like it did w/her daughter. Mom BF 1 week. She couldn't tolerate the pain any longer. Newborn feeding habits, STS, I&O positioning, support, props reviewed. Mom encouraged to feed baby 8-12 times/24 hours and with feeding cues.  Praised mom to doing well. Mom states still uncomfortable but not terrible. Baby BF well. Encouraged mom to call for assistance as needed. Maternal Data Has patient been taught Hand Expression?: Yes Does the patient have breastfeeding experience prior to this delivery?: Yes How long did the patient breastfeed?: 1 week  Feeding    LATCH Score Latch: Grasps breast easily, tongue down, lips flanged, rhythmical sucking.  Audible Swallowing: A few with stimulation  Type of Nipple: Everted at rest and after stimulation  Comfort (Breast/Nipple): Soft / non-tender  Hold (Positioning): Assistance needed to correctly position infant at breast and maintain latch.  LATCH Score: 8   Lactation Tools Discussed/Used    Interventions Interventions: Breast feeding basics reviewed;Adjust position;Assisted with latch;Support pillows;Skin to skin;Position options;Breast massage;Hand express;LC Services brochure;Breast compression  Discharge    Consult Status Consult Status: Follow-up Date: 08/03/22 Follow-up type: In-patient    Megan Tran, Diamond Nickel 08/03/2022, 12:08 AM

## 2022-08-03 NOTE — Progress Notes (Signed)
POSTPARTUM PROGRESS NOTE  POD #1  Subjective:  Megan Tran is a 32 y.o. [id number] s/p pLTCS at [redacted]w[redacted]d.  She reports she doing well. No acute events overnight. She reports she is doing well. She denies any problems with ambulating, voiding or po intake. Denies nausea or vomiting. She has  passed flatus. Pain is well controlled.  Lochia is minimal.  Objective: Blood pressure 106/75, pulse 83, temperature (!) 97.3 F (36.3 C), temperature source Oral, resp. rate 18, height 5\' 5"  (1.651 m), weight 78.7 kg, last menstrual period 10/30/2021, SpO2 96 %, unknown if currently breastfeeding.  Physical Exam:  General: alert, cooperative and no distress Chest: no respiratory distress Heart:regular rate, distal pulses intact Abdomen: soft, nontender,  Uterine Fundus: firm, appropriately tender DVT Evaluation: No calf swelling or tenderness Extremities: no edema Skin: warm, dry; incision clean/dry/intact w/ pressure dressing in place  Recent Labs    07/31/22 0922 08/03/22 0411  HGB 11.6* 9.7*  HCT 34.9* 29.1*    Assessment/Plan: Megan Tran is a 32 y.o. [id number] s/p pLTCS at [redacted]w[redacted]d for LGA and history of 4th degree perineal laceration.  POD#1 - Doing welll; pain well controlled. H/H appropriate  Routine postpartum care  OOB, ambulated  Lovenox for VTE prophylaxis Mild acute blood loss Anemia: asymptomatic  Start po ferrous sulfate every other day.  Contraception: None Feeding: breast  Dispo: Plan for discharge in 1-2 days.   LOS: 1 day   Sheppard Evens MD MPH OB Fellow, Faculty Practice [facility], Center for [facility] 08/03/2022

## 2022-08-04 MED ORDER — OXYCODONE HCL 5 MG PO TABS: 5.0000 mg | ORAL_TABLET | Freq: Four times a day (QID) | ORAL | 0 refills | Status: DC | PRN

## 2022-08-04 MED ORDER — FERROUS SULFATE 325 (65 FE) MG PO TABS: 325.0000 mg | ORAL_TABLET | ORAL | 0 refills | Status: DC

## 2022-08-04 MED ORDER — IBUPROFEN 600 MG PO TABS: 600.0000 mg | ORAL_TABLET | Freq: Three times a day (TID) | ORAL | 0 refills | Status: DC | PRN

## 2022-08-04 MED ORDER — SIMETHICONE 80 MG PO CHEW: 80.0000 mg | CHEWABLE_TABLET | Freq: Four times a day (QID) | ORAL | 0 refills | Status: DC | PRN

## 2022-08-04 MED ORDER — POLYETHYLENE GLYCOL 3350 17 GM/SCOOP PO POWD: 17.0000 g | Freq: Every day | ORAL | 0 refills | Status: DC | PRN

## 2022-08-04 NOTE — Discharge Instructions (Signed)
Take off the honeycomb dressing 5 days after surgery as specified.  2.  Follow-up outpatient in 1 week for an incision check and in 4 to 6 weeks, as scheduled for your postpartum visit.   3.  Take Tylenol 1000 mg and ibuprofen 600 mg scheduled times a day for the next few days, to help with your pain.  You can also take oxycodone as needed for breakthrough pain.  4. Take oral iron with some vitamin C or some fruit to help boost up your blood levels  5.  Please call if you start to experience increased vaginal bleeding requiring a change of pads every 2 hours, sudden onset severe headache, right abdominal pain, flashes of light in a vision or any concerns for new elevated blood pressure.  

## 2022-08-04 NOTE — Lactation Note (Signed)
This note was copied from a baby's chart. Lactation Consultation Note  Patient Name: Megan Tran [id number] Date: 08/04/2022 Age:30 hours Reason for consult: Follow-up assessment;Mother's request  P2, Mother's left nipple is sore and she feels baby is biting when latched.  She is applying coconut oil and colostrum. Suggest calling LC to view next feeding. Noted pacifiers in room and suggest waiting on pacifier until baby is 91 weeks old until breastfeeding is well established. Suggest prepumping with manual pump before latching.  24 mm flange seems appropriate at this time.  Maternal Data Has patient been taught Hand Expression?: Yes  Feeding Mother's Current Feeding Choice: Breast Milk Lactation Tools Discussed/Used Tools: Pump Breast pump type: Manual Pump Education: Setup, frequency, and cleaning;Milk Storage Reason for Pumping: prepump Pumping frequency: PRN  Interventions Interventions: Breast feeding basics reviewed;Education;Hand pump  Discharge Pump: Personal;Hands Free Margarette [place])  Consult Status Consult Status: Follow-up Date: 08/04/22 Follow-up type: In-patient    Dahlia Byes [facility] 08/04/2022, 8:50 AM

## 2022-08-04 NOTE — Lactation Note (Signed)
This note was copied from a baby's chart. Lactation Consultation Note  Patient Name: Boy Myeisha Sangha [id number] Date: 08/04/2022 Age:30 hours Reason for consult: Follow-up assessment;Mother's request  P2, Mother requested LC check latch since it feels like pinching when infant latches and L nipple has abrasions on tip.  Noted short anterior lingual frenulum.  Provided resource sheet and recommend discussing with Ped MD. Assisted with latching deeper on breast and suggest mother post pump to give additional volume to baby 3-4 times per day.  Provided milk storage guidelines. Reviewed engorgement care and monitoring voids/stools. Observed latch on both breast with intermittent swallows.   Maternal Data Has patient been taught Hand Expression?: Yes Does the patient have breastfeeding experience prior to this delivery?: Yes  Feeding Mother's Current Feeding Choice: Breast Milk  LATCH Score Latch: Grasps breast easily, tongue down, lips flanged, rhythmical sucking.  Audible Swallowing: A few with stimulation  Type of Nipple: Everted at rest and after stimulation  Comfort (Breast/Nipple): Filling, red/small blisters or bruises, mild/mod discomfort  Hold (Positioning): Assistance needed to correctly position infant at breast and maintain latch.  LATCH Score: 7   Lactation Tools Discussed/Used Tools: Pump Breast pump type: Manual Pump Education: Setup, frequency, and cleaning;Milk Storage Reason for Pumping: prepump Pumping frequency: PRN  Interventions Interventions: Breast feeding basics reviewed;Assisted with latch;Skin to skin;Pre-pump if needed;Coconut oil;Education  Discharge Pump: Personal;Hands Free  Consult Status Consult Status: Follow-up Date: 08/04/22 Follow-up type: In-patient    Dahlia Byes [facility] 08/04/2022, 9:27 AM

## 2022-08-06 ENCOUNTER — Encounter: Payer: BC Managed Care – PPO | Admitting: Advanced Practice Midwife

## 2022-08-07 ENCOUNTER — Encounter: Payer: Self-pay | Admitting: Obstetrics & Gynecology

## 2022-08-10 NOTE — Addendum Note (Signed)
Addendum  created 08/10/22 1437 by Linton Rump, MD   Intraprocedure Staff edited

## 2022-08-12 ENCOUNTER — Ambulatory Visit (INDEPENDENT_AMBULATORY_CARE_PROVIDER_SITE_OTHER): Payer: BC Managed Care – PPO | Admitting: Obstetrics & Gynecology

## 2022-08-12 ENCOUNTER — Encounter: Payer: Self-pay | Admitting: Obstetrics & Gynecology

## 2022-08-12 VITALS — BP 113/70 | HR 92 | Ht 65.0 in | Wt 149.0 lb

## 2022-08-12 DIAGNOSIS — Z9889 Other specified postprocedural states: Secondary | ICD-10-CM

## 2022-08-12 MED ORDER — HYDROCODONE-ACETAMINOPHEN 5-325 MG PO TABS: 1.0000 | ORAL_TABLET | Freq: Four times a day (QID) | ORAL | 0 refills | Status: DC | PRN

## 2022-08-12 NOTE — Progress Notes (Signed)
  HPI: Patient returns for routine postoperative follow-up having undergone primary C section on 08/02/22.  The patient's immediate postoperative recovery has been unremarkable. Since hospital discharge the patient reports no problems, allergy to oxycodone.   Current Outpatient Medications: ferrous sulfate 325 (65 FE) MG tablet, Take 1 tablet (325 mg total) by mouth every other day., Disp: 30 tablet, Rfl: 0 HYDROcodone-acetaminophen (NORCO/VICODIN) 5-325 MG tablet, Take 1 tablet by mouth every 6 (six) hours as needed., Disp: 15 tablet, Rfl: 0 ibuprofen (ADVIL) 600 MG tablet, Take 1 tablet (600 mg total) by mouth every 8 (eight) hours as needed for moderate pain or cramping. Take with meals, Disp: 30 tablet, Rfl: 0 polyethylene glycol powder (GLYCOLAX/MIRALAX) 17 GM/SCOOP powder, Take 17 g by mouth daily as needed for moderate constipation. Mix with 8oz of orange juice, Disp: 510 g, Rfl: 0 Prenatal Vit-Fe Fumarate-FA (PRENATAL VITAMIN PO), Take 1 tablet by mouth at bedtime., Disp: , Rfl:  montelukast (SINGULAIR) 4 MG chewable tablet, Chew 1 tablet (4 mg total) by mouth at bedtime. (Patient not taking: Reported on 08/12/2022), Disp: 90 tablet, Rfl: 2 simethicone (MYLICON) 80 MG chewable tablet, Chew 1 tablet (80 mg total) by mouth every 6 (six) hours as needed for flatulence., Disp: 30 tablet, Rfl: 0  No current facility-administered medications for this visit.    Blood pressure 113/70, pulse 92, height  (1.651 m), weight 149 lb (67.6 kg), last menstrual period 10/30/2021, not currently breastfeeding.  Physical Exam: Normal abdominal exam  Incision clean dry intact  Diagnostic Tests:   Pathology: na  Impression + Management plan:   ICD-10-CM   1. Post-operative state: S/P primary C section(Hx of 4th degree + fetal macrosomia) 08/02/22  Z98.890           Medications Prescribed this encounter: Orders Placed This Encounter     HYDROcodone-acetaminophen (NORCO/VICODIN) 5-325 MG  tablet         Sig: Take 1 tablet by mouth every 6 (six) hours as needed.         Dispense:  15 tablet         Refill:  0     Follow up: No follow-ups on file.    Lazaro Arms, MD Attending Physician for the Center for [facility] 08/12/2022 9:59 AM

## 2022-08-18 ENCOUNTER — Ambulatory Visit (INDEPENDENT_AMBULATORY_CARE_PROVIDER_SITE_OTHER): Payer: BC Managed Care – PPO | Admitting: Gastroenterology

## 2022-08-18 ENCOUNTER — Encounter (INDEPENDENT_AMBULATORY_CARE_PROVIDER_SITE_OTHER): Payer: Self-pay | Admitting: Gastroenterology

## 2022-08-21 ENCOUNTER — Ambulatory Visit (INDEPENDENT_AMBULATORY_CARE_PROVIDER_SITE_OTHER): Payer: BC Managed Care – PPO | Admitting: Gastroenterology

## 2022-09-04 ENCOUNTER — Other Ambulatory Visit (HOSPITAL_COMMUNITY)
Admission: RE | Admit: 2022-09-04 | Discharge: 2022-09-04 | Disposition: A | Discharge status: Home / Self Care | Payer: BC Managed Care – PPO | Source: Ambulatory Visit | Attending: Advanced Practice Midwife | Admitting: Advanced Practice Midwife

## 2022-09-04 ENCOUNTER — Encounter: Payer: Self-pay | Admitting: Advanced Practice Midwife

## 2022-09-04 ENCOUNTER — Ambulatory Visit (INDEPENDENT_AMBULATORY_CARE_PROVIDER_SITE_OTHER): Payer: BC Managed Care – PPO | Admitting: Advanced Practice Midwife

## 2022-09-04 DIAGNOSIS — Z124 Encounter for screening for malignant neoplasm of cervix: Secondary | ICD-10-CM

## 2022-09-04 MED ORDER — LEVONORGEST-ETH ESTRAD 91-DAY 0.15-0.03 &0.01 MG PO TABS: 1.0000 | ORAL_TABLET | Freq: Every day | ORAL | 4 refills | Status: DC

## 2022-09-04 NOTE — Progress Notes (Signed)
Post Partum Visit Note   Chief Complaint:   Postpartum Care  History of Present Illness:   Megan Tran is a 32 y.o. G26P2002 Caucasian female being seen today for a postpartum visit. She is 4 weeks postpartum following a primary cesarean section, low transverse incision at 39.3 gestational weeks. Hx 4th degree, LGA. Anesthesia: spinal.  Laceration: n/a.  Complications: none. Inpatient contraception: no.   Pregnancy uncomplicated. Tobacco use: no. Substance use disorder: no. Last pap smear: 1/23 and results were LSIL w/ HRHPV positive: other (not 16, 18/45). Next pap smear due: today Patient's last menstrual period was 10/30/2021.  Postpartum course has been uncomplicated. Bleeding staining only. Bowel function is normal. Bladder function is normal. Urinary incontinence? No, fecal incontinence? No Patient is not sexually active. Last sexual activity: prior to birth.    Upstream - 09/04/22 1446       Pregnancy Intention Screening   Does the patient want to become pregnant in the next year? No    Does the patient's partner want to become pregnant in the next year? No    Would the patient like to discuss contraceptive options today? Yes      Contraception Wrap Up   Current Method Abstinence    Contraception Counseling Provided Yes            The pregnancy intention screening data noted above was reviewed. Potential methods of contraception were discussed. The patient elected to proceed with No data recorded.  Edinburgh Postpartum Depression Screening: Negative  Edinburgh Postnatal Depression Scale - 09/04/22 1446       Edinburgh Postnatal Depression Scale:  In the Past 7 Days   I have been able to laugh and see the funny side of things. 0    I have looked forward with enjoyment to things. 0    I have blamed myself unnecessarily when things went wrong. 0    I have been anxious or worried for no good reason. 0    I have felt scared or panicky for no good reason. 0    Things  have been getting on top of me. 1    I have been so unhappy that I have had difficulty sleeping. 0    I have felt sad or miserable. 0    I have been so unhappy that I have been crying. 0    The thought of harming myself has occurred to me. 0    Edinburgh Postnatal Depression Scale Total 1            Baby's course has been uncomplicated. Baby is feeding by bottle. Infant has a pediatrician/family doctor? Yes.  Childcare strategy if returning to work/school: yes.  Pt has material needs met for her and baby: Yes.   Review of Systems:   Pertinent items are noted in HPI Denies Abnormal vaginal discharge w/ itching/odor/irritation, headaches, visual changes, shortness of breath, chest pain, abdominal pain, severe nausea/vomiting, or problems with urination or bowel movements. Pertinent History Reviewed:  Reviewed past medical,surgical, obstetrical and family history.  Reviewed problem list, medications and allergies. OB History  Gravida Para Term Preterm AB Living  2 2 2  0 0 2  SAB IAB Ectopic Multiple Live Births  0 0 0 0 2    # Outcome Date GA Lbr Len/2nd Weight Sex Delivery Anes PTL Lv  2 Term 08/02/22 108w3d  9 lb 7.7 oz (4.3 kg) M CS-LTranv   LIV  1 Term 06/22/16 [redacted]w[redacted]d 52:03 / 03:00 7 lb 14.5  oz (3.585 kg) F Vag-Spont EPI N LIV   Physical Assessment:   Vitals:   09/04/22 1445  BP: 117/69  Pulse: 86  Height: 5\' 5"  (1.651 m)  Body mass index is 24.79 kg/m.  Objective:  Blood pressure 117/69, pulse 86, height 5\' 5"  (1.651 m), last menstrual period 10/30/2021, not currently breastfeeding.  General:  alert, cooperative, and no distress   Breasts:  negative  Lungs: Normal respiratory effort  Heart:  regular rate and rhythm  Abdomen: soft, non-tender, incision well healed   Vulva:  normal  Vagina: normal vagina  Cervix:  Normal  PAP collected  Corpus: Well involuted  Adnexa:  not evaluated  Rectal Exam: no hemorrhoids          No results found for this or any previous  visit (from the past 24 hour(s)).  Assessment & Plan:  1) Postpartum exam 2) 4 wks s/p primary cesarean section, low transverse incision 3) bottle feeding 4) Depression screening 5) Contraception management: start COCs when baby turns 35 weeks old, Bu for 2 weeks  Essential components of care per ACOG recommendations:  1.  Mood and well being:  If positive depression screen, discussed and plan developed.  If using tobacco we discussed reduction/cessation and risk of relapse If current substance abuse, we discussed and referral to local resources was offered.   2. Infant care and feeding:  If breastfeeding, discussed returning to work, pumping, breastfeeding-associated pain, guidance regarding return to fertility while lactating if not using another method. If needed, patient was provided with a letter to be allowed to pump q 2-3hrs to support lactation in a private location with access to a refrigerator to store breastmilk.   Recommended that all caregivers be immunized for flu, pertussis and other preventable communicable diseases If pt does not have material needs met for her/baby, referred to local resources for help obtaining these.  3. Sexuality, contraception and birth spacing Provided guidance regarding sexuality, management of dyspareunia, and resumption of intercourse Discussed avoiding interpregnancy interval <60mths and recommended birth spacing of 18 months  4. Sleep and fatigue Discussed coping options for fatigue and sleep disruption Encouraged family/partner/community support of 4 hrs of uninterrupted sleep to help with mood and fatigue  5. Physical recovery  If pt had a C/S, assessed incisional pain and providing guidance on normal vs prolonged recovery If pt had a laceration, perineal healing and pain reviewed.  If urinary or fecal incontinence, discussed management and referred to PT or uro/gyn if indicated  Patient is safe to resume physical activity. Discussed  attainment of healthy weight.  6.  Chronic disease management Discussed pregnancy complications if any, and their implications for future childbearing and long-term maternal health. Review recommendations for prevention of recurrent pregnancy complications, such as aspirin to reduce risk of preeclampsia not applicable. Pt had GDM: No. If yes, 2hr GTT scheduled: not applicable. Reviewed medications and non-pregnant dosing including consideration of whether pt is breastfeeding using a reliable resource such as LactMed: yes Referred for f/u w/ PCP or subspecialist providers as indicated: not applicable  7. Health maintenance Mammogram at 30yo or earlier if indicated If pap normal, repeat one year  Meds:  Meds ordered this encounter  Medications   Levonorgestrel-Ethinyl Estradiol (AMETHIA) 0.15-0.03 &0.01 MG tablet    Sig: Take 1 tablet by mouth daily.    Dispense:  91 tablet    Refill:  4    Order Specific Question:   Supervising Provider    Answer:   Despina Hidden,  LUTHER H [[id number]]    Follow-up: No follow-ups on file.   No orders of the defined types were placed in this encounter.      Jacklyn Shell DNP, CNM Center for [employer], [facility] 09/04/2022 3:16 PM

## 2022-09-10 LAB — CYTOLOGY - PAP
Comment: NEGATIVE
Diagnosis: NEGATIVE
High risk HPV: NEGATIVE

## 2022-10-18 ENCOUNTER — Other Ambulatory Visit (INDEPENDENT_AMBULATORY_CARE_PROVIDER_SITE_OTHER): Payer: Self-pay | Admitting: Gastroenterology

## 2022-10-21 ENCOUNTER — Encounter: Payer: Self-pay | Admitting: Obstetrics & Gynecology

## 2022-11-11 ENCOUNTER — Ambulatory Visit (INDEPENDENT_AMBULATORY_CARE_PROVIDER_SITE_OTHER): Payer: BC Managed Care – PPO | Admitting: Adult Health

## 2022-11-11 ENCOUNTER — Encounter: Payer: Self-pay | Admitting: Adult Health

## 2022-11-11 VITALS — BP 114/68 | HR 80 | Ht 64.0 in | Wt 154.0 lb

## 2022-11-11 DIAGNOSIS — Z3202 Encounter for pregnancy test, result negative: Secondary | ICD-10-CM

## 2022-11-11 DIAGNOSIS — L905 Scar conditions and fibrosis of skin: Secondary | ICD-10-CM | POA: Diagnosis not present

## 2022-11-11 DIAGNOSIS — N921 Excessive and frequent menstruation with irregular cycle: Secondary | ICD-10-CM

## 2022-11-11 LAB — POCT URINE PREGNANCY: Preg Test, Ur: NEGATIVE

## 2022-11-11 NOTE — Progress Notes (Addendum)
  Subjective:     Patient ID: Megan Tran, female   DOB: [birth year], 32 y.o.   MRN: [id number]  HPI Megan Tran is a 32 year old white female, with SO, G2P2002, in complaining of irregular bleeding and stinging sensation about left edge, C section scar. She had  C section in 08/02/22, and started BCP in May. June periods was 6/19 to 7/8 then started bleeding again 11/08/22.     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   HPVHIGH Negative 09/04/2022 1453   HPVHIGH Positive (A) 05/10/2021 0927   ADEQPAP  09/04/2022 1453    Satisfactory for evaluation; transformation zone component PRESENT.   ADEQPAP  05/10/2021 0927    Satisfactory for evaluation; transformation zone component PRESENT.   ADEQPAP  04/19/2018 0000    Satisfactory for evaluation  endocervical/transformation zone component PRESENT.    PCP is Dr Sherwood Gambler.  Review of Systems Irregular bleeding Stinging sensation about C section scar Reviewed past medical,surgical, social and family history. Reviewed medications and allergies.     Objective:   Physical Exam BP 114/68 (BP Location: Left Arm, Patient Position: Sitting, Cuff Size: Normal)   Pulse 80   Ht 5\' 4"  (1.626 m)   Wt 154 lb (69.9 kg)   LMP 11/08/2022   Breastfeeding No   BMI 26.43 kg/m  UPT is negative  Skin warm and dry. Lungs: clear to ausculation bilaterally. Cardiovascular: regular rate and rhythm.    +tender above C section scar Declines exam  Fall risk is low  Upstream - 11/11/22 1435       Pregnancy Intention Screening   Does the patient want to become pregnant in the next year? No    Does the patient's partner want to become pregnant in the next year? No    Would the patient like to discuss contraceptive options  today? No      Contraception Wrap Up   Current Method Oral Contraceptive    End Method Oral Contraceptive             Assessment:     1. Pregnancy examination or test, negative result - POCT urine pregnancy  2. Irregular intermenstrual bleeding +irregular bleeding on Amethia Will check labs  Continue BCP - CBC - Comprehensive metabolic panel - TSH + free T4  3. Tender scar Had  C section 08/02/22  Has stinging about scar  Discussed that nerves were cut, may have stinging and burning and pin prick sensation at times  Will check CBC  - CBC     Plan:     Follow up prn

## 2022-11-12 LAB — TSH+FREE T4
Free T4: 1.38 ng/dL (ref 0.82–1.77)
TSH: 1.76 u[IU]/mL (ref 0.450–4.500)

## 2022-11-12 LAB — CBC
Hematocrit: 37.1 % (ref 34.0–46.6)
Hemoglobin: 12.2 g/dL (ref 11.1–15.9)
MCH: 27.7 pg (ref 26.6–33.0)
MCHC: 32.9 g/dL (ref 31.5–35.7)
MCV: 84 fL (ref 79–97)
Platelets: 375 10*3/uL (ref 150–450)
RBC: 4.4 x10E6/uL (ref 3.77–5.28)
RDW: 13.1 % (ref 11.7–15.4)
WBC: 7.2 10*3/uL (ref 3.4–10.8)

## 2022-11-12 LAB — COMPREHENSIVE METABOLIC PANEL
ALT: 30 IU/L (ref 0–32)
AST: 23 IU/L (ref 0–40)
Albumin: 4.2 g/dL (ref 4.0–5.0)
Alkaline Phosphatase: 45 IU/L (ref 44–121)
BUN/Creatinine Ratio: 10 (ref 9–23)
BUN: 6 mg/dL (ref 6–20)
Bilirubin Total: <0.2 mg/dL (ref 0.0–1.2)
CO2: 22 mmol/L (ref 20–29)
Calcium: 9.1 mg/dL (ref 8.7–10.2)
Chloride: 102 mmol/L (ref 96–106)
Creatinine, Ser: 0.58 mg/dL (ref 0.57–1.00)
Globulin, Total: 2.7 g/dL (ref 1.5–4.5)
Glucose: 81 mg/dL (ref 70–99)
Potassium: 4.1 mmol/L (ref 3.5–5.2)
Sodium: 138 mmol/L (ref 134–144)
Total Protein: 6.9 g/dL (ref 6.0–8.5)
eGFR: 125 mL/min/{1.73_m2} (ref >59)

## 2022-11-21 ENCOUNTER — Other Ambulatory Visit (INDEPENDENT_AMBULATORY_CARE_PROVIDER_SITE_OTHER): Payer: Self-pay | Admitting: Gastroenterology

## 2022-12-26 ENCOUNTER — Other Ambulatory Visit (INDEPENDENT_AMBULATORY_CARE_PROVIDER_SITE_OTHER): Payer: Self-pay | Admitting: Gastroenterology

## 2022-12-26 NOTE — Telephone Encounter (Signed)
Will need office visit for further refills. Last seen 01/2022

## 2023-02-26 ENCOUNTER — Ambulatory Visit (INDEPENDENT_AMBULATORY_CARE_PROVIDER_SITE_OTHER): Payer: BC Managed Care – PPO | Admitting: Gastroenterology

## 2023-02-26 ENCOUNTER — Encounter (INDEPENDENT_AMBULATORY_CARE_PROVIDER_SITE_OTHER): Payer: Self-pay | Admitting: Gastroenterology

## 2023-02-26 VITALS — BP 109/67 | HR 91 | Temp 97.5°F | Ht 64.0 in | Wt 151.3 lb

## 2023-02-26 DIAGNOSIS — K589 Irritable bowel syndrome without diarrhea: Secondary | ICD-10-CM | POA: Insufficient documentation

## 2023-02-26 DIAGNOSIS — K219 Gastro-esophageal reflux disease without esophagitis: Secondary | ICD-10-CM | POA: Diagnosis not present

## 2023-02-26 MED ORDER — PANTOPRAZOLE SODIUM 40 MG PO TBEC: 40.0000 mg | DELAYED_RELEASE_TABLET | Freq: Two times a day (BID) | ORAL | 3 refills | Status: DC

## 2023-02-26 NOTE — Patient Instructions (Addendum)
Continue pantoprazole 40 mg twice a day Explained presumed etiology of reflux symptoms. Instruction provided in the use of antireflux medication - patient should take medication  30-45 minutes before eating breakfast and dinner. Discussed avoidance of eating within 2 hours of lying down to sleep and benefit of blocks to elevate head of bed. Also, will benefit from avoiding carbonated drinks/sodas or food that has tomatoes, spicy or greasy food. Patient to read more about TIF procedure, if proceeding with this she will need to have esophagram performed Explained presumed etiology of IBS symptoms. Patient was counseled about the benefit of implementing a low FODMAP to improve symptoms and recurrent episodes. A dietary list was provided to the patient. Also, the patient was counseled about the benefit of avoiding stressing situations and potential environmental triggers leading to symptomatology.  Start FDGard 1 tablet every 8-12 hours as needed for bloating.

## 2023-02-26 NOTE — Progress Notes (Signed)
[name], M.D. Gastroenterology & Hepatology [facility] Gastroenterology [address] [place], [place] [address]  Primary Care Physician: [name], MD [address] [place] [address]  I will communicate my assessment and recommendations to the referring MD via EMR.  Problems: GERD IBS  History of Present Illness: Megan Tran is a 32 y.o. female with past medical history of GERD and IBS, who presents for follow up of GERD.  The patient was last seen on 02/13/2022. At that time, the patient advised to start Protonix 40 mg twice daily and to start taking MiraLAX regularly.  Patient reports that she is taking pantoprazole 40 mg BID at least 30 minutes before meals, but sometimes forgets taking it before dinner. She reports having heartburn 10 times per month, usually when she takes soda and [place] food. She states it also happens when she takes PPI after dinner time. May take Tums when she has breakthrough episodes.  States that her heartburn was worse when she was pregnant but this has improved.  States she feels bloating intermittently, at least 4 times a week.  She has presented recurrent bloating.States her abdominal bloating improves when she moves her bowels or when she passes gas.  States she has tried Nexium and pantoprazole in the past. Insurance did not [employer].  She is taking Miralax and stool softeners, which is making her move her bowels once a day.  The patient denies having any nausea, vomiting, fever, chills, hematochezia, melena, hematemesis, abdominal pain, diarrhea, jaundice, pruritus or weight loss.  Last EGD: 02/01/2021 - Z- line irregular, 34 cm from the incisors. - 1 cm hiatal hernia. - Normal stomach. - Normal examined duodenum. Biopsied.  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   05/16/21 repeat pap in 1 year per ASCCP guidelines 5 year risk of CIN3+ 3.78   Medical history non-contributory    Pregnant 10/19/2015   Vaginal burning 11/16/2015   Vaginal discharge during pregnancy in first trimester 11/16/2015   Yeast infection 11/02/2012    Past Surgical History: Past Surgical History:  Procedure Laterality Date   BIOPSY  02/01/2021   Procedure: BIOPSY;  Surgeon: Dolores Frame, MD;  Location: [facility];  Service: Gastroenterology;;  small, bowel biopsies   CESAREAN SECTION N/A 08/02/2022   Procedure: CESAREAN SECTION;  Surgeon: Lazaro Arms, MD;  Location: [facility] LD ORS;  Service: Obstetrics;  Laterality: N/A;   ESOPHAGOGASTRODUODENOSCOPY (EGD) WITH PROPOFOL N/A 02/01/2021   Procedure: ESOPHAGOGASTRODUODENOSCOPY (EGD) WITH PROPOFOL;  Surgeon: Dolores Frame, MD;  Location: [facility];  Service: Gastroenterology;  Laterality: N/A;  8:45   MOUTH SURGERY  Oct. 2012    Family History: Family History  Problem Relation Age of Onset   Hypertension Maternal Grandmother    Hypertension Maternal Grandfather    Heart disease Maternal Grandfather     Social History: Social History   Tobacco Use  Smoking Status Former   Current packs/day: 0.00   Types: Cigarettes   Quit date: 09/24/2015   Years since quitting: 7.4   Passive exposure: Past  Smokeless Tobacco Former   Social History   Substance and Sexual Activity  Alcohol Use Not Currently   Comment: occassional; not now  Social History   Substance and Sexual Activity  Drug Use No    Allergies: Allergies  Allergen Reactions   Cefdinir Hives    Whole body rash   Dicyclomine Rash   Oxycodone Rash    Medications: Current Outpatient Medications  Medication Sig Dispense Refill   pantoprazole (PROTONIX) 40 MG tablet  TAKE 1 TABLET BY MOUTH TWICE DAILY . APPOINTMENT REQUIRED FOR FUTURE REFILLS 60 tablet 1   polyethylene glycol powder (GLYCOLAX/MIRALAX) 17 GM/SCOOP powder Take 17 g by mouth daily as needed for moderate constipation. Mix with 8oz of orange juice 510 g 0   No current facility-administered medications for this visit.    Review of Systems: GENERAL: negative for malaise, night sweats HEENT: No changes in hearing or vision, no nose bleeds or other nasal problems. NECK: Negative for lumps, goiter, pain and significant neck swelling RESPIRATORY: Negative for cough, wheezing CARDIOVASCULAR: Negative for chest pain, leg swelling, palpitations, orthopnea GI: SEE HPI MUSCULOSKELETAL: Negative for joint pain or swelling, back pain, and muscle pain. SKIN: Negative for lesions, rash PSYCH: Negative for sleep disturbance, mood disorder and recent psychosocial stressors. HEMATOLOGY Negative for prolonged bleeding, bruising easily, and swollen nodes. ENDOCRINE: Negative for cold or heat intolerance, polyuria, polydipsia and goiter. NEURO: negative for tremor, gait imbalance, syncope and seizures. The remainder of the review of systems is noncontributory.   Physical Exam: BP 109/67 (BP Location: Left Arm, Patient Position: Sitting, Cuff Size: Normal)   Pulse 91   Temp (!) 97.5 F (36.4 C) (Temporal)   Ht 5\' 4"  (1.626 m)   Wt 151 lb 4.8 oz (68.6 kg)   LMP 02/08/2023   BMI 25.97 kg/m  GENERAL: The patient is AO x3, in no acute distress. HEENT: Head is normocephalic and atraumatic. EOMI are intact. Mouth is well hydrated and without lesions. NECK: Supple. No masses LUNGS: Clear to auscultation. No presence of rhonchi/wheezing/rales. Adequate chest expansion HEART: RRR, normal s1 and s2. ABDOMEN: Soft, nontender, no guarding, no peritoneal signs, and nondistended. BS +. No masses. EXTREMITIES: Without any cyanosis, clubbing, rash, lesions or edema. NEUROLOGIC: AOx3, no focal motor deficit. SKIN:  no jaundice, no rashes  Imaging/Labs: as above  I personally reviewed and interpreted the available labs, imaging and endoscopic files.  Impression and Plan: Megan Tran is a 32 y.o. female with past medical history of GERD and IBS, who presents for follow up of GERD.  The patient has presented partial control of her GERD symptoms while using pantoprazole 40 mg twice a day.  She reports that she is feeling better compared to how she was doing in the past but still has not achieved symptom remission.  She has had previous pH impedance testing that showed she had confirmed reflux.  We discussed about nonpharmacologic options for reflux in detail, including possibility of performing TIF.  The details of the procedure, benefits and risks, as well as prognosis with this intervention was thoroughly discussed with the patient who understood and agreed.  Dietary modifications and post procedural recommendations were also discussed the patient.  The patient will read more about this procedure.  For now, she will continue on pantoprazole 40 mg twice a day.  She has presented recurrent bloating episodes, which is likely related to her IBS.  She may benefit from implementing a low fiber diet and using FDgard as needed.  -Continue pantoprazole 40 mg twice a day -Explained presumed etiology of reflux symptoms. Instruction provided in the use of antireflux medication - patient should take  medication  30-45 minutes before eating breakfast and dinner. Discussed avoidance of eating within 2 hours of lying down to sleep and benefit of blocks to elevate head of bed. Also, will benefit from avoiding carbonated drinks/sodas or food that has tomatoes, spicy or greasy food. -Patient to read more about TIF procedure, if proceeding with this she will need to have esophagram performed -Explained presumed etiology of IBS symptoms. Patient was counseled about the benefit of implementing a low FODMAP to improve symptoms and  recurrent episodes. A dietary list was provided to the patient. Also, the patient was counseled about the benefit of avoiding stressing situations and potential environmental triggers leading to symptomatology. - Start FDGard 1 tablet every 8-12 hours as needed for bloating.   All questions were answered.      [name], MD Gastroenterology and Hepatology [facility]enter[facility]

## 2023-12-01 ENCOUNTER — Other Ambulatory Visit: Payer: Self-pay | Admitting: Advanced Practice Midwife

## 2023-12-15 ENCOUNTER — Telehealth (INDEPENDENT_AMBULATORY_CARE_PROVIDER_SITE_OTHER): Payer: Self-pay

## 2023-12-15 ENCOUNTER — Other Ambulatory Visit: Payer: Self-pay | Admitting: Gastroenterology

## 2023-12-15 DIAGNOSIS — K219 Gastro-esophageal reflux disease without esophagitis: Secondary | ICD-10-CM

## 2023-12-15 MED ORDER — LANSOPRAZOLE 30 MG PO CPDR: 30.0000 mg | DELAYED_RELEASE_CAPSULE | Freq: Two times a day (BID) | ORAL | 1 refills | Status: DC

## 2023-12-15 NOTE — Telephone Encounter (Signed)
 Looks like the patient has tried and failed Nexim,omeprazole . Has not tried lansoprazole .   [name] [place] Hours of Operations: Address: [address] 5 a.m. - 10 p.m. PT, Monday-Friday [address] 6 a.m. - 3 p.m. PT, Saturday [place], [place] [address] [place], [place] [address] Date: 12/10/2023 To: [name] From: [facility] Phone: [phone] Phone: [phone] Fax: [phone] Reference #: [id number] RE: Prior Authorization Request Patient Name: Megan Tran Patient DOB: 06-30-1990 Patient ID: [id number] Status of Request: Deny Medication Name: Pantoprazole  Tab 40mg  GPI/NDC: [id number] Decision Notes: Per your health plan's criteria, this drug is covered if you meet the following: (1) If only over-the-counter (OTC) drugs are available, you have tried or cannot use at least three OTC (equivalent) drugs (if only one or only two OTC drugs are available, you must have tried or cannot use all that are available) [document drug(s), dose, duration of trial]. OTC drugs include Prevacid  24HR, Prilosec  OTC, Nexium  24HR. The information provided does not show that you meet the criteria listed above. Please speak with your doctor about your options. Reviewed by: R.Ph. **Please note: This drug cannot be further reviewed for the quantity limit until the above criteria have been addressed. If the treating physician would like to discuss this coverage decision with the physician or health care professional reviewer, please call [facility] Prior Authorization department at [phone].

## 2023-12-15 NOTE — Telephone Encounter (Signed)
 Preferred products are: [id number] - NEXIUM  GRA 10MG  DR, [id number] - NEXIUM  GRA 5MG  DR, [id number] - LANSOPRAZOLE  SUS 3MG /ML, [id number] GLENWOOD AGAR SUS 2MG /FO*TFJMU*[id number]* Appeal is available if needed.

## 2023-12-15 NOTE — Telephone Encounter (Signed)
 Please let her know that her insurance is no longer covering pantoprazole , I will send lansoprazole  30 mg twice a day to her pharmacy.

## 2023-12-16 ENCOUNTER — Ambulatory Visit: Admitting: Advanced Practice Midwife

## 2023-12-16 ENCOUNTER — Telehealth (INDEPENDENT_AMBULATORY_CARE_PROVIDER_SITE_OTHER): Payer: Self-pay | Admitting: Gastroenterology

## 2023-12-16 NOTE — Telephone Encounter (Signed)
 PA completed for Lansoprazole  30 mg capsules. Awaiting decision

## 2023-12-16 NOTE — Telephone Encounter (Signed)
 I called and left a vm on patient phone that we have been trying to reach her regarding her medication. I did let her know that I had sent a my chart message that if she had any questions she could respond through My Chart or call the office.

## 2023-12-22 ENCOUNTER — Telehealth (INDEPENDENT_AMBULATORY_CARE_PROVIDER_SITE_OTHER): Payer: Self-pay

## 2023-12-22 NOTE — Telephone Encounter (Signed)
 This is very shocking. Please let her know that her insurance initially stated that her pantoprazole  would not be covered unless she had failed other prescription PPIs.  However, despite prescribing one of the PPIs that were recommended in the initial letter, the insurance is now stating that she needs to try over-the-counter PPIs to be possibly eligible for coverage.  Due to this, she will need to pay out-of-pocket many of her PPIs.  As pantoprazole  was previously working for her, I will recommend her to buy over-the-counter pantoprazole  20 mg and to take 2 pills every 12 hours as prior.

## 2023-12-22 NOTE — Telephone Encounter (Signed)
 [name] Hours of Operations: Address: [address] 5 a.m. - 10 p.m. PT, Monday-Friday [address] 6 a.m. - 3 p.m. PT, Saturday [place], [place] [address] [place], [place] [address] Date: 12/22/2023 To: [name] From: [facility] Phone: [phone] Phone: [phone] Fax: [phone] Reference #: [id number] RE: Prior Authorization Request Patient Name: Megan Tran Patient DOB: Aug 10, 1990 Patient ID: [id number] Status of Request: Deny Medication Name: Lansoprazole  Cap 30mg  Dr GPI/NDC: [id number] Decision Notes: Per your health plan's criteria, this drug is covered if you meet the following: (1) If only over-the-counter (OTC) drugs are available, you have tried or cannot use at least three OTC (equivalent) drugs (if only one or only two OTC drugs are available, you must have tried or cannot use all that are available) [document drug(s), dose, duration of trial]. OTC drugs include Prevacid  (OTC lansoprazole ), Prilosec  (OTC omeprazole ), and Nexium  (OTC esomeprazole ). The information provided does not show that you meet the criteria listed above. Please speak with your doctor about your choices. Reviewed by: Jolaine If the treating physician would like to discuss this coverage decision with the physician or health care professional reviewer, please call [facility] Prior Authorization department at [phone].  [id number] QUESTIONS? We're here to help. Page 3 of 10 Visit [email or web address] or call the number on your member ID card. Case number: [id number]

## 2023-12-23 NOTE — Telephone Encounter (Signed)
 I called and left a vm asked that the patient please return call to the office.

## 2023-12-24 ENCOUNTER — Encounter (INDEPENDENT_AMBULATORY_CARE_PROVIDER_SITE_OTHER): Payer: Self-pay

## 2023-12-24 NOTE — Telephone Encounter (Signed)
 Unable to reach patient after multiple phone message left on her Vm and Messages sent through My Chart. Letter below mailed to the patient on 12/24/2023.  December 24, 2023   Megan Tran [address] [place] [address]   Dear [name],   Hello [name], we have made several attempts at reaching you via phone and through My Chart. Per Dr. Eartha, Please let her know that her insurance initially stated that her pantoprazole  would not be covered unless she had failed other prescription PPIs.  However, despite prescribing one of the PPIs that were recommended in the initial letter, the insurance is now stating that she needs to try over-the-counter PPIs to be possibly eligible for coverage.  Due to this, she will need to pay out-of-pocket many of her PPIs.  As pantoprazole  was previously working for her, I will recommend her to buy over-the-counter pantoprazole  20 mg and to take 2 pills every 12 hours as prior. If you have any questions please call the office or reach out to us  through My Chart.    Sincerely,  Dak Szumski

## 2023-12-24 NOTE — Telephone Encounter (Signed)
 Thanks

## 2024-01-28 ENCOUNTER — Ambulatory Visit (INDEPENDENT_AMBULATORY_CARE_PROVIDER_SITE_OTHER): Admitting: Advanced Practice Midwife

## 2024-01-28 ENCOUNTER — Encounter: Payer: Self-pay | Admitting: Advanced Practice Midwife

## 2024-01-28 ENCOUNTER — Other Ambulatory Visit (HOSPITAL_COMMUNITY)
Admission: RE | Admit: 2024-01-28 | Discharge: 2024-01-28 | Disposition: A | Discharge status: Home / Self Care | Source: Ambulatory Visit | Attending: Advanced Practice Midwife | Admitting: Advanced Practice Midwife

## 2024-01-28 VITALS — BP 111/72 | HR 85 | Ht 64.0 in | Wt 154.0 lb

## 2024-01-28 DIAGNOSIS — Z23 Encounter for immunization: Secondary | ICD-10-CM

## 2024-01-28 DIAGNOSIS — R87619 Unspecified abnormal cytological findings in specimens from cervix uteri: Secondary | ICD-10-CM

## 2024-01-28 DIAGNOSIS — Z01419 Encounter for gynecological examination (general) (routine) without abnormal findings: Secondary | ICD-10-CM | POA: Diagnosis not present

## 2024-01-28 DIAGNOSIS — F419 Anxiety disorder, unspecified: Secondary | ICD-10-CM | POA: Insufficient documentation

## 2024-01-28 DIAGNOSIS — N644 Mastodynia: Secondary | ICD-10-CM

## 2024-01-28 MED ORDER — ESCITALOPRAM OXALATE 10 MG PO TABS: 10.0000 mg | ORAL_TABLET | Freq: Every day | ORAL | 3 refills | Status: AC

## 2024-01-28 MED ORDER — PNV PRENATAL PLUS MULTIVITAMIN 27-1 MG PO TABS: 1.0000 | ORAL_TABLET | Freq: Every day | ORAL | 11 refills | Status: AC

## 2024-01-28 MED ORDER — IMIQUIMOD 5 % EX CREA: TOPICAL_CREAM | CUTANEOUS | 6 refills | Status: DC

## 2024-01-28 NOTE — Addendum Note (Signed)
 Addended by: ILEAN RUTHERFORD HERO on: 01/28/2024 11:19 AM   Modules accepted: Orders

## 2024-01-28 NOTE — Patient Instructions (Addendum)
 [facility] offers online women's holistic mental health counseling and therapy provided by licensed mental health counselors and therapists.   You can refer yourself using the link below: (if it isn't clickable from your mychart account, copy and paste it in a new browser).  If you have ANY problems, please let me know and I will help troubleshoot.   [email or web address]  OR  [email or web address]     HPV (Human Papillomavirus) Vaccine: What You Need to Know Many vaccine information statements are available in Spanish and other languages. See PromoAge.com.br. 1. Why get vaccinated? HPV (human papillomavirus) vaccine can prevent infection with some types of human papillomavirus. HPV infections can cause certain types of cancers, including: cervical, vaginal, and vulvar cancers in women penile cancer in men anal cancers in both men and women cancers of tonsils, base of tongue, and back of throat (oropharyngeal cancer) in both men and women HPV infections can also cause anogenital warts. HPV vaccine can prevent over 90% of cancers caused by HPV. HPV is spread through intimate skin-to-skin or sexual contact. HPV infections are so common that nearly all people will get at least one type of HPV at some time in their lives. Most HPV infections go away on their own within 2 years. But sometimes HPV infections will last longer and can cause cancers later in life. 2. HPV vaccine HPV vaccine is routinely recommended for adolescents at 62 or 31 years of age to ensure they are protected before they are exposed to the virus. HPV vaccine may be given beginning at age 39 years and vaccination is recommended for everyone through 31 years of age. HPV vaccine may be given to adults 27 through 31 years of age, based on discussions between the patient and health care provider. Most children who get the first dose before 90 years of age  need 2 doses of HPV vaccine. People who get the first dose at or after 57 years of age and younger people with certain immunocompromising conditions need 3 doses. Your health care provider can give you more information. HPV vaccine may be given at the same time as other vaccines. 3. Talk with your health care provider Tell your vaccination provider if the person getting the vaccine: Has had an allergic reaction after a previous dose of HPV vaccine, or has any severe, life-threatening allergies Is pregnant--HPV vaccine is not recommended until after pregnancy In some cases, your health care provider may decide to postpone HPV vaccination until a future visit. People with minor illnesses, such as a cold, may be vaccinated. People who are moderately or severely ill should usually wait until they recover before getting HPV vaccine. Your health care provider can give you more information. 4. Risks of a vaccine reaction Soreness, redness, or swelling where the shot is given can happen after HPV vaccination. Fever or headache can happen after HPV vaccination. People sometimes faint after medical procedures, including vaccination. Tell your provider if you feel dizzy or have vision changes or ringing in the ears. As with any medicine, there is a very remote chance of a vaccine causing a severe allergic reaction, other serious injury, or death. 5. What if there is a serious problem? An allergic reaction could occur after the vaccinated person leaves the clinic. If you see signs of a severe allergic reaction (hives, swelling of the face and throat, difficulty breathing, a fast heartbeat, dizziness, or weakness), call 9-1-1 and get the person to the nearest hospital. For other signs that concern you, call your health  care provider. Adverse reactions should be reported to the Vaccine Adverse Event Reporting System (VAERS). Your health care provider will usually file this report, or you can do it yourself. Visit  the VAERS website at www.vaers.LAgents.no or call 715-788-0646. VAERS is only for reporting reactions, and VAERS staff members do not give medical advice. 6. The National Vaccine Injury Compensation Program The [employer] Vaccine Injury Compensation Program (VICP) is a federal program that was created to compensate people who may have been injured by certain vaccines. Claims regarding alleged injury or death due to vaccination have a time limit for filing, which may be as short as two years. Visit the VICP website at SpiritualWord.at or call (413)361-9800 to learn about the program and about filing a claim. 7. How can I learn more? Ask your health care provider. Call your local or state health department. Visit the website of the Food and Drug Administration (FDA) for vaccine package inserts and additional information at FinderList.no. Contact the Centers for Disease Control and Prevention (CDC): Call (516)035-3754 (1-800-CDC-INFO) or Visit CDC's website at PicCapture.uy. Source: CDC Vaccine Information Statement HPV Vaccine (11/25/2019) This same material is available at FootballExhibition.com.br for no charge. This information is not intended to replace advice given to you by your health care provider. Make sure you discuss any questions you have with your health care provider. Document Revised: 07/23/2022 Document Reviewed: 04/28/2022 Elsevier Patient Education  2024 [employer].

## 2024-01-28 NOTE — Progress Notes (Addendum)
 Megan Tran 33 y.o.  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   05/16/21 repeat pap in 1 year per ASCCP guidelines 5 year risk of CIN3+ 3.78   Medical history non-contributory     Past Surgical History: Past Surgical History:  Procedure Laterality Date   BIOPSY  02/01/2021   Procedure: BIOPSY;  Surgeon: Eartha Angelia Sieving, MD;  Location: [facility];  Service: Gastroenterology;;  small, bowel biopsies   CESAREAN SECTION N/A 08/02/2022   Procedure: CESAREAN SECTION;  Surgeon: Jayne Vonn DEL, MD;  Location: [facility];  Service: Obstetrics;  Laterality: N/A;   ESOPHAGOGASTRODUODENOSCOPY (EGD) WITH PROPOFOL  N/A 02/01/2021   Procedure: ESOPHAGOGASTRODUODENOSCOPY (EGD) WITH PROPOFOL ;  Surgeon: Eartha Angelia Sieving, MD;  Location: [facility];  Service: Gastroenterology;  Laterality: N/A;  8:45   MOUTH SURGERY  Oct. 2012    Family History: Family History  Problem Relation Age of Onset   Heart attack Paternal Grandfather    Hypertension Maternal Grandmother    Alzheimer's disease Maternal Grandmother    Hypertension Maternal Grandfather    Heart disease Maternal Grandfather     Social History: Social History   Tobacco Use   Smoking status: Former    Current packs/day: 0.00    Types: Cigarettes    Quit date: 09/24/2015    Years since quitting: 8.3    Passive exposure: Past   Smokeless tobacco: Former  Building services engineer status: Never Used  Substance Use Topics   Alcohol use: Not Currently    Comment: occassional; not now   Drug use: No    Allergies:  Allergies  Allergen Reactions   Cefdinir Hives    Whole body rash    Dicyclomine  Rash   Oxycodone  Rash      Current Outpatient Medications:    amoxicillin-clavulanate (AUGMENTIN) 875-125 MG tablet, Take 1 tablet every 12 hours by oral route for 7 days., Disp: , Rfl:    Cetirizine HCl (ZYRTEC ALLERGY PO), , Disp: , Rfl:    escitalopram (LEXAPRO) 10 MG tablet, Take 1 tablet (10 mg total) by mouth daily., Disp: 90 tablet, Rfl: 3   [START ON 01/29/2024] imiquimod (ALDARA) 5 % cream, Apply topically 3 (three) times a week., Disp: 12 each, Rfl: 6   lansoprazole  (PREVACID ) 30 MG capsule, Take 1 capsule (30 mg total) by mouth 2 (two) times daily before a meal., Disp: 180 capsule, Rfl: 1   Multiple Vitamin (MULTIVITAMIN) tablet, Take 1 tablet by mouth daily., Disp: , Rfl:    polyethylene glycol powder (GLYCOLAX /MIRALAX ) 17 GM/SCOOP powder, Take 17 g by mouth daily as needed for moderate constipation. Mix with 8oz of orange juice, Disp: 510 g, Rfl: 0   Prenatal Vit-Fe Fumarate-FA (PNV PRENATAL PLUS MULTIVITAMIN) 27-1 MG TABS, Take 1 tablet by mouth daily., Disp: 30 tablet, Rfl: 11      01/28/2024    8:46 AM 05/15/2022    9:57 AM 01/27/2022    9:55 AM 04/19/2018    4:07 PM 03/23/2018    4:25 PM  Depression screen PHQ 2/9  Decreased Interest 0 0  0 0 0  Down, Depressed, Hopeless 1 0 0 0 0  PHQ - 2 Score 1 0 0 0 0  Altered sleeping 0 0 0    Tired, decreased energy 1 2 1     Change in appetite 0 0 1    Feeling bad or failure about yourself  0 0 0    Trouble concentrating 1 0 0    Moving slowly or fidgety/restless 0 0 0    Suicidal thoughts 0 0 0    PHQ-9 Score 3 2 2           01/28/2024    8:46 AM 05/15/2022    9:57 AM 01/27/2022    9:55 AM  GAD 7 : Generalized Anxiety Score  Nervous, Anxious, on Edge 1 0 0  Control/stop worrying 0 0 0  Worry too much - different things 1 0 1  Trouble relaxing 1 0 0  Restless 0 0 0  Easily annoyed or irritable 1 1 1   Afraid - awful might happen 0 0 0  Total GAD 7 Score 4 1 2       Mental Health score normal Yes but  c/o anxiety Follow up referred to [facility] yes   Upstream - 01/28/24 0844       Pregnancy Intention Screening   Does the patient want to become pregnant in the next year? Yes    Does the patient's partner want to become pregnant in the next year? Yes    Would the patient like to discuss contraceptive options today? No      Contraception Wrap Up   Current Method Pregnant/Seeking Pregnancy    End Method Pregnant/Seeking Pregnancy    Contraception Counseling Provided No          The pregnancy intention screening data noted above was reviewed. Potential methods of contraception were discussed. The patient elected to proceed with Pregnant/Seeking Pregnancy.   History of Present Illness: Here for pap. Periods are fine and regular.  Would like to be pregnant.  Has had left breast pain that radiates into armpit at times . Had US  in 2019, showed fibroglandular tissue. Still has same issue, would like it imaged.   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  Discussed aldara and both she and her husband getting gardisil to (hopefully) prevent reinfection if one partner clears it.  Pt trying to get pregnant, should wait until afterwards so she won't have to repeat the series if pg happens in the next 6 months   Requests meds for anxiety.  Discussed [facility]/talk therapy.  Info given.  Prior cytology:      Component Value Date/Time   DIAGPAP  09/04/2022 1453    - Negative for intraepithelial lesion or malignancy (NILM)   DIAGPAP - Low grade squamous intraepithelial lesion (LSIL) (A) 05/10/2021 0927   DIAGPAP  04/19/2018 0000    NEGATIVE FOR INTRAEPITHELIAL LESIONS OR MALIGNANCY.   HPVHIGH Negative 09/04/2022 1453   HPVHIGH Positive (A) 05/10/2021 0927   ADEQPAP  09/04/2022 1453    Satisfactory for evaluation; transformation zone component PRESENT.   ADEQPAP  05/10/2021 0927    Satisfactory for evaluation; transformation zone component PRESENT.   ADEQPAP   04/19/2018 0000    Satisfactory for evaluation  endocervical/transformation zone component PRESENT.      Review of Systems   Patient denies any headaches, blurred vision, shortness of breath, chest pain, abdominal pain, problems with bowel movements, urination, or intercourse.  Physical Exam: General:  Well developed, well nourished, no acute distress Skin:  Warm and dry Neck:  Midline trachea Lungs; Clear to auscultation bilaterally Breast:  No dominant palpable mass, retraction, or nipple discharge.  Left breast tender in outer quad and near areola.  Cardiovascular: Regular rate and rhythm Abdomen:  Soft, non tender, no hepatosplenomegaly Pelvic:  External genitalia is normal in appearance.  The vagina is normal in appearance but a little red (just finishing up abx and diflucan )  The cervix is bulbous.  Uterus is felt to be normal size, shape, and contour.  No adnexal masses or tenderness.  Tiny wart on left side of anus Extremities:  No swelling or varicosities noted  Chaperone: Rutherford Rover  Impression: Normal GYN exam   1st gardisil todya  Plan: If pap normal, repeat in 3 years  Orders Placed This Encounter  Procedures   MM 3D DIAGNOSTIC MAMMOGRAM BILATERAL BREAST    Standing Status:   Future    Expiration Date:   03/29/2025    Reason for Exam (SYMPTOM  OR DIAGNOSIS REQUIRED):   left breast pain    Is the patient pregnant?:   No    Preferred imaging location?:   [facility]   [place]  LIMITED ULTRASOUND INCLUDING AXILLA LEFT BREAST     Standing Status:   Future    Expiration Date:   03/29/2025    Reason for Exam (SYMPTOM  OR DIAGNOSIS REQUIRED):   left breast pain    Preferred imaging location?:   [facility]   [place]  LIMITED ULTRASOUND INCLUDING AXILLA RIGHT BREAST    Standing Status:   Future    Expiration Date:   07/28/2024    Reason for Exam (SYMPTOM  OR DIAGNOSIS REQUIRED):   left breast pain    Preferred imaging location?:   [facility]    Meds ordered this encounter  Medications   Prenatal Vit-Fe Fumarate-FA (PNV PRENATAL PLUS MULTIVITAMIN) 27-1 MG TABS    Sig: Take 1 tablet by mouth daily.    Dispense:  30 tablet    Refill:  11    Supervising Provider:   JAYNE MINDER H [[id number]]   escitalopram (LEXAPRO) 10 MG tablet    Sig: Take 1 tablet (10 mg total) by mouth daily.    Dispense:  90 tablet    Refill:  3    Supervising Provider:   JAYNE MINDER H [[id number]]   imiquimod (ALDARA) 5 % cream    Sig: Apply topically 3 (three) times a week.    Dispense:  12 each    Refill:  6    Supervising Provider:   EURE, LUTHER H [[id number]]

## 2024-02-02 ENCOUNTER — Ambulatory Visit (HOSPITAL_COMMUNITY)
Admission: RE | Admit: 2024-02-02 | Discharge: 2024-02-02 | Disposition: A | Source: Ambulatory Visit | Attending: Advanced Practice Midwife | Admitting: Advanced Practice Midwife

## 2024-02-02 ENCOUNTER — Ambulatory Visit (HOSPITAL_COMMUNITY)
Admission: RE | Admit: 2024-02-02 | Discharge: 2024-02-02 | Disposition: A | Discharge status: Home / Self Care | Source: Ambulatory Visit | Attending: Advanced Practice Midwife | Admitting: Advanced Practice Midwife

## 2024-02-02 DIAGNOSIS — N644 Mastodynia: Secondary | ICD-10-CM | POA: Diagnosis present

## 2024-02-02 LAB — CYTOLOGY - PAP
Adequacy: ABSENT
Comment: NEGATIVE
Diagnosis: NEGATIVE
High risk HPV: NEGATIVE

## 2024-02-03 ENCOUNTER — Encounter (INDEPENDENT_AMBULATORY_CARE_PROVIDER_SITE_OTHER): Payer: Self-pay | Admitting: Gastroenterology

## 2024-02-04 ENCOUNTER — Encounter: Payer: Self-pay | Admitting: Advanced Practice Midwife

## 2024-02-04 DIAGNOSIS — R599 Enlarged lymph nodes, unspecified: Secondary | ICD-10-CM | POA: Insufficient documentation

## 2024-02-29 ENCOUNTER — Ambulatory Visit (INDEPENDENT_AMBULATORY_CARE_PROVIDER_SITE_OTHER): Payer: BC Managed Care – PPO | Admitting: Gastroenterology

## 2024-03-10 ENCOUNTER — Ambulatory Visit

## 2024-03-10 ENCOUNTER — Encounter

## 2024-03-10 DIAGNOSIS — Z3201 Encounter for pregnancy test, result positive: Secondary | ICD-10-CM

## 2024-03-10 LAB — POCT URINE PREGNANCY: Preg Test, Ur: POSITIVE — AB

## 2024-03-10 NOTE — Progress Notes (Signed)
   NURSE VISIT- PREGNANCY CONFIRMATION   SUBJECTIVE:  Megan Tran is a 33 y.o. G4P2002 female at [redacted]w[redacted]d by certain LMP of Patient's last menstrual period was 02/07/2024. Here for pregnancy confirmation.  Home pregnancy test: positive x 2  She reports cramping.  She is taking prenatal vitamins.    OBJECTIVE:  LMP 02/07/2024   Breastfeeding No   Appears well, in no apparent distress  No results found for this or any previous visit (from the past 24 hours).  ASSESSMENT: Positive pregnancy test, [redacted]w[redacted]d by LMP    PLAN: Schedule for dating ultrasound in 3 weeks Prenatal vitamins: continue   Nausea medicines: not currently needed   OB packet given: Yes  Alan LITTIE Fischer  03/10/2024 8:40 AM

## 2024-03-12 ENCOUNTER — Inpatient Hospital Stay (HOSPITAL_COMMUNITY)
Admission: AD | Admit: 2024-03-12 | Discharge: 2024-03-12 | Disposition: A | Discharge status: Home / Self Care | Attending: Obstetrics & Gynecology | Admitting: Obstetrics & Gynecology

## 2024-03-12 ENCOUNTER — Other Ambulatory Visit: Payer: Self-pay

## 2024-03-12 ENCOUNTER — Inpatient Hospital Stay (HOSPITAL_COMMUNITY)

## 2024-03-12 DIAGNOSIS — B379 Candidiasis, unspecified: Secondary | ICD-10-CM

## 2024-03-12 DIAGNOSIS — B3731 Acute candidiasis of vulva and vagina: Secondary | ICD-10-CM | POA: Diagnosis not present

## 2024-03-12 DIAGNOSIS — Z3201 Encounter for pregnancy test, result positive: Secondary | ICD-10-CM | POA: Insufficient documentation

## 2024-03-12 DIAGNOSIS — O3680X0 Pregnancy with inconclusive fetal viability, not applicable or unspecified: Secondary | ICD-10-CM | POA: Diagnosis not present

## 2024-03-12 DIAGNOSIS — R7989 Other specified abnormal findings of blood chemistry: Secondary | ICD-10-CM

## 2024-03-12 DIAGNOSIS — O98811 Other maternal infectious and parasitic diseases complicating pregnancy, first trimester: Secondary | ICD-10-CM | POA: Diagnosis not present

## 2024-03-12 DIAGNOSIS — Z3A01 Less than 8 weeks gestation of pregnancy: Secondary | ICD-10-CM | POA: Insufficient documentation

## 2024-03-12 DIAGNOSIS — O209 Hemorrhage in early pregnancy, unspecified: Secondary | ICD-10-CM | POA: Diagnosis present

## 2024-03-12 DIAGNOSIS — R1030 Lower abdominal pain, unspecified: Secondary | ICD-10-CM | POA: Diagnosis present

## 2024-03-12 DIAGNOSIS — Z113 Encounter for screening for infections with a predominantly sexual mode of transmission: Secondary | ICD-10-CM | POA: Diagnosis present

## 2024-03-12 DIAGNOSIS — N939 Abnormal uterine and vaginal bleeding, unspecified: Secondary | ICD-10-CM

## 2024-03-12 LAB — URINALYSIS, ROUTINE W REFLEX MICROSCOPIC
Bilirubin Urine: NEGATIVE
Glucose, UA: NEGATIVE mg/dL
Ketones, ur: NEGATIVE mg/dL
Leukocytes,Ua: NEGATIVE
Nitrite: NEGATIVE
Protein, ur: NEGATIVE mg/dL
RBC / HPF: >50 RBC/hpf (ref 0–5)
Specific Gravity, Urine: 1.011 (ref 1.005–1.030)
pH: 6 (ref 5.0–8.0)

## 2024-03-12 LAB — WET PREP, GENITAL
Clue Cells Wet Prep HPF POC: NONE SEEN
Sperm: NONE SEEN
Trich, Wet Prep: NONE SEEN
WBC, Wet Prep HPF POC: <10 (ref <10)

## 2024-03-12 LAB — CBC
HCT: 39.6 % (ref 36.0–46.0)
Hemoglobin: 13.3 g/dL (ref 12.0–15.0)
MCH: 28.1 pg (ref 26.0–34.0)
MCHC: 33.6 g/dL (ref 30.0–36.0)
MCV: 83.7 fL (ref 80.0–100.0)
Platelets: 307 K/uL (ref 150–400)
RBC: 4.73 MIL/uL (ref 3.87–5.11)
RDW: 14 % (ref 11.5–15.5)
WBC: 6.3 K/uL (ref 4.0–10.5)
nRBC: 0 % (ref 0.0–0.2)

## 2024-03-12 LAB — ABO/RH: ABO/RH(D): A POS

## 2024-03-12 LAB — HCG, QUANTITATIVE, PREGNANCY: hCG, Beta Chain, Quant, S: 18 m[IU]/mL — ABNORMAL HIGH (ref <5)

## 2024-03-12 NOTE — MAU Provider Note (Signed)
 S Ms. Megan Tran is a 33 y.o. [id number] patient who presents to MAU today with complaint of seeing she is.  4 weeks and 6 days reporting that she had some brown vaginal spotting yesterday that has been pink.  Patient reports she had a positive pregnancy confirmation of family training on 03/10/2024 and her last menstrual period was 02/07/2024.  She denies any lower abdominal cramping, reports no recent sexual activity, denies any vaginal itching, burning, vaginal irritation or urinary s/s  and reports no other issues at this time.  The remainder of the ROS is negative unless otherwise noted above in HPI    O BP 120/68 (BP Location: Right Arm)   Pulse 88   Temp 97.8 F (36.6 C) (Oral)   Resp 16   Ht 5' 4 (1.626 m)   Wt 69 kg   LMP 02/07/2024   SpO2 99%   BMI 26.13 kg/m  Physical Exam Vitals and nursing note reviewed.  Constitutional:      General: She is not in acute distress.    Appearance: Normal appearance. She is not ill-appearing.  HENT:     Head: Normocephalic.     Nose: Nose normal.     Mouth/Throat:     Mouth: Mucous membranes are moist.  Cardiovascular:     Rate and Rhythm: Normal rate.  Pulmonary:     Effort: Pulmonary effort is normal.  Abdominal:     Palpations: Abdomen is soft.     Tenderness: There is no abdominal tenderness.  Musculoskeletal:        General: Normal range of motion.     Cervical back: Normal range of motion.  Skin:    General: Skin is warm.  Neurological:     Mental Status: She is alert and oriented to person, place, and time.  Psychiatric:        Mood and Affect: Mood normal.        Behavior: Behavior normal.    MDM  HIGH  Vaginal bleeding in early pregnancy  CBC: NM HCG Quant: 18 ( Repeat in 48 hours ) 11/24 message sent to the office for scheduling  ABO: A Positive OB Ultrasound: (See  Imaging report below ), No IUP and no evidence of ectopic pregnancy at this time likely due to early pregnancy . F/U and serial HCG's  per recommendations Vaginal Swabs ( Yeast Present) UA: Large blood otherwise no evidence of UTI likely d/t vaginal bleeding   Differential diagnosis considered for 1st trimester vaginal bleeding includes but is not limited to: ectopic pregnancy, complete spontaneous abortion, incomplete abortion, missed abortion, threatened abortion, embryonic/fetal demise, cervical insufficiency, cervical or vaginal disorder    Orders Placed This Encounter  Procedures   Wet prep, genital    Standing Status:   Standing    Number of Occurrences:   1   US  OB LESS THAN 14 WEEKS WITH OB TRANSVAGINAL    Standing Status:   Standing    Number of Occurrences:   1    Symptom/Reason for Exam:   Vaginal bleeding [[id number]]   CBC    Standing Status:   Standing    Number of Occurrences:   1   hCG, quantitative, pregnancy    Standing Status:   Standing    Number of Occurrences:   1   Urinalysis, Routine w reflex microscopic -Urine, Random    Standing Status:   Standing    Number of Occurrences:   1    Specimen Source:  Urine, Random [[id number]]   ABO/Rh    Standing Status:   Standing    Number of Occurrences:   1   Discharge patient Discharge disposition: 01-Home or Self Care; Discharge patient date: 03/12/2024    Standing Status:   Standing    Number of Occurrences:   1    Discharge disposition:   01-Home or Self Care [1]    Discharge patient date:   03/12/2024    Study Result  Narrative & Impression  CLINICAL DATA:  [id number] Vaginal bleeding [id number]   EXAM: OBSTETRIC <14 WK US  AND TRANSVAGINAL OB US    TECHNIQUE: Both transabdominal and transvaginal ultrasound examinations were performed for complete evaluation of the gestation as well as the maternal uterus, adnexal regions, and pelvic cul-de-sac. Transvaginal technique was performed to assess early pregnancy.   COMPARISON:  None Available.   FINDINGS: Intrauterine gestational sac: None. On cine images there is a small anechoic area at the level of the  cervix measuring 3 mm. This is nonspecific.   LMP: 02/07/2024.  Gestational age by LMP would be 4 weeks 6 days.   Subchorionic hemorrhage: None   Right ovary: RIGHT ovary measures 2.4 x 2.5 x 2.3 cm for an estimated volume of 7.4 ML.   Left ovary: LEFT ovary measures 3.2 x 1.9 x 2.9 cm for estimated volume of 8.9 ML.   Other :None   Free fluid:  Small to moderate free fluid with internal debris.   IMPRESSION: 1. No intrauterine gestational sac, yolk sac, or fetal pole identified. In the setting of positive pregnancy test and no definite intrauterine pregnancy, this reflects a pregnancy of unknown location. Differential considerations include early normal IUP, abnormal IUP, or nonvisualized ectopic pregnancy. Differentiation is achieved with serial beta HCG supplemented by repeat sonography as clinically warranted. 2. Small to moderate mildly complicated free fluid.     Electronically Signed   By: Megan Tran M.D.   On: 03/12/2024 09:28   I have reviewed the patient chart and performed the physical exam . I have ordered & interpreted the lab results and reviewed and interpreted the ultrasound and images independently and agree with the radiologist findings. Medications ordered as stated below.  A/P as described below.  Counseling and education provided and patient agreeable  with plan as described below. Verbalized understanding.    ASSESSMENT Medical screening exam complete  Elevated serum hCG  Yeast infection  Vaginal bleeding  Positive pregnancy test    PLAN Future Appointments  Date Time Provider Department Center  03/31/2024  3:45 PM [facility] [place] [facility]  [facility] None    Discharge from MAU in stable condition  See AVS for full description of educational information and instructions provided to the patient at time of discharge  Warning signs for worsening condition that would warrant emergency follow-up discussed  Patient may return to MAU as needed    Littie Olam LABOR, NP 03/12/2024 10:20 AM   This chart was dictated using voice recognition software, Dragon. Despite the best efforts of this provider to proofread and correct errors, errors may still occur which can change documentation meaning.

## 2024-03-12 NOTE — MAU Note (Signed)
 Megan Tran is a 33 y.o. at [redacted]w[redacted]d here in MAU reporting: started noticing some brown vaginal spotting yesterday that turned pink and now is bright red. Not heavy bleeding, has a panty liner on currently but only sees the bleeding when she wipes. Lower abdominal cramping since yesterday. Denies any recent sexual intercourse. Reports vaginal itching and felt raw in the vaginal area. Denies any unusual discharge.   LMP: 02/07/24 Onset of complaint: yesterday Pain score: 2 Vitals:   03/12/24 0835  BP: 120/68  Pulse: 88  Resp: 16  Temp: 97.8 F (36.6 C)  SpO2: 99%     FHT:n/a  Lab orders placed from triage:

## 2024-03-14 ENCOUNTER — Telehealth: Payer: Self-pay | Admitting: Family Medicine

## 2024-03-14 ENCOUNTER — Telehealth: Payer: Self-pay

## 2024-03-14 DIAGNOSIS — O039 Complete or unspecified spontaneous abortion without complication: Secondary | ICD-10-CM

## 2024-03-14 LAB — GC/CHLAMYDIA PROBE AMP (~~LOC~~) NOT AT ARMC
Chlamydia: NEGATIVE
Comment: NEGATIVE
Comment: NORMAL
Neisseria Gonorrhea: NEGATIVE

## 2024-03-14 NOTE — Telephone Encounter (Signed)
 Patient called regarding Bleeding like a period, passing clots egg sized over the weekend. Patient had positive pregnancy test on 03/10/24. Was seen in MAU on 03/12/24 with HcG level of 18. Bleeding and clots has slowed since this weekend significantly. RN discussed this being a miscarriage and what next steps are. Hcg level placed for today patient aware on going to get it drawn. All questions answered.

## 2024-03-14 NOTE — Telephone Encounter (Signed)
 Called patient to schedule for repeat hcg check in the office today Due to this being a last minute appt I did let the pt know that [facility] [place]. Left office number for patient to call us  today when she receives this message.

## 2024-03-16 LAB — BETA HCG QUANT (REF LAB): hCG Quant: 2 m[IU]/mL

## 2024-03-21 ENCOUNTER — Ambulatory Visit: Payer: Self-pay | Admitting: Adult Health

## 2024-03-30 ENCOUNTER — Other Ambulatory Visit: Payer: Self-pay | Admitting: Obstetrics & Gynecology

## 2024-03-30 DIAGNOSIS — O3680X0 Pregnancy with inconclusive fetal viability, not applicable or unspecified: Secondary | ICD-10-CM

## 2024-03-31 ENCOUNTER — Other Ambulatory Visit

## 2024-05-03 ENCOUNTER — Ambulatory Visit

## 2024-05-03 ENCOUNTER — Encounter: Payer: Self-pay | Admitting: Women's Health

## 2024-05-03 VITALS — BP 123/76 | HR 83 | Ht 65.0 in | Wt 155.0 lb

## 2024-05-03 DIAGNOSIS — R109 Unspecified abdominal pain: Secondary | ICD-10-CM

## 2024-05-03 DIAGNOSIS — Z3201 Encounter for pregnancy test, result positive: Secondary | ICD-10-CM

## 2024-05-03 DIAGNOSIS — Z8759 Personal history of other complications of pregnancy, childbirth and the puerperium: Secondary | ICD-10-CM

## 2024-05-03 LAB — POCT URINE PREGNANCY: Preg Test, Ur: POSITIVE — AB

## 2024-05-03 MED ORDER — DOXYLAMINE-PYRIDOXINE 10-10 MG PO TBEC: DELAYED_RELEASE_TABLET | ORAL | 6 refills | Status: AC

## 2024-05-03 NOTE — Addendum Note (Signed)
 Addended by: KIZZIE SUZEN SAUNDERS on: 05/03/2024 04:58 PM   Modules accepted: Orders

## 2024-05-03 NOTE — Progress Notes (Signed)
" ° °  NURSE VISIT- PREGNANCY CONFIRMATION   SUBJECTIVE:  Megan Tran is a 34 y.o. [id number] female at [redacted]w[redacted]d by certain LMP of Patient's last menstrual period was 04/04/2024 (exact date). Here for pregnancy confirmation.  Home pregnancy test: positive x 6  She reports cramping.  She is taking prenatal vitamins.    OBJECTIVE:  BP 123/76 (BP Location: Right Arm, Patient Position: Sitting, Cuff Size: Normal)   Pulse 83   Ht 5' 5 (1.651 m)   Wt 155 lb (70.3 kg)   LMP 04/04/2024 (Exact Date)   BMI 25.79 kg/m   Appears well, in no apparent distress  Results for orders placed or performed in visit on 05/03/24 (from the past 24 hours)  POCT urine pregnancy   Collection Time: 05/03/24  3:53 PM  Result Value Ref Range   Preg Test, Ur Positive (A) Negative    ASSESSMENT: Positive pregnancy test, [redacted]w[redacted]d by LMP    PLAN: Schedule for dating ultrasound in 4 weeks  Prenatal vitamins: note routed to Luke Fetters CNM  to send prescription   Nausea medicines: not currently needed   OB packet given: Yes  Aleck FORBES Blase  05/03/2024 3:53 PM  "

## 2024-05-04 ENCOUNTER — Other Ambulatory Visit: Payer: Self-pay | Admitting: Women's Health

## 2024-05-04 ENCOUNTER — Ambulatory Visit: Payer: Self-pay | Admitting: Women's Health

## 2024-05-04 DIAGNOSIS — O3680X0 Pregnancy with inconclusive fetal viability, not applicable or unspecified: Secondary | ICD-10-CM

## 2024-05-04 LAB — BETA HCG QUANT (REF LAB): hCG Quant: 58 m[IU]/mL

## 2024-05-06 LAB — BETA HCG QUANT (REF LAB): hCG Quant: 199 m[IU]/mL

## 2024-05-10 ENCOUNTER — Other Ambulatory Visit: Payer: Self-pay | Admitting: Women's Health

## 2024-05-10 DIAGNOSIS — O3680X0 Pregnancy with inconclusive fetal viability, not applicable or unspecified: Secondary | ICD-10-CM

## 2024-05-11 ENCOUNTER — Other Ambulatory Visit: Payer: Self-pay | Admitting: *Deleted

## 2024-05-11 DIAGNOSIS — O3680X0 Pregnancy with inconclusive fetal viability, not applicable or unspecified: Secondary | ICD-10-CM

## 2024-05-12 ENCOUNTER — Ambulatory Visit: Payer: Self-pay | Admitting: Women's Health

## 2024-05-12 LAB — BETA HCG QUANT (REF LAB): hCG Quant: 2513 m[IU]/mL

## 2024-05-18 ENCOUNTER — Other Ambulatory Visit: Payer: Self-pay | Admitting: Women's Health

## 2024-05-18 DIAGNOSIS — O3680X0 Pregnancy with inconclusive fetal viability, not applicable or unspecified: Secondary | ICD-10-CM

## 2024-05-19 ENCOUNTER — Ambulatory Visit: Payer: Self-pay | Admitting: Women's Health

## 2024-05-19 LAB — BETA HCG QUANT (REF LAB): hCG Quant: 29167 m[IU]/mL

## 2024-05-26 ENCOUNTER — Ambulatory Visit: Payer: Self-pay | Admitting: Women's Health

## 2024-05-26 LAB — BETA HCG QUANT (REF LAB): hCG Quant: 105777 m[IU]/mL

## 2024-05-31 ENCOUNTER — Other Ambulatory Visit
